# Patient Record
Sex: Female | Born: 1935 | Race: White | Hispanic: No | Smoking: Former smoker
Health system: Southern US, Community
[De-identification: ages and names within clinical notes are randomized; demographics above are authoritative.]

## PROBLEM LIST (undated history)

## (undated) DIAGNOSIS — I219 Acute myocardial infarction, unspecified: Secondary | ICD-10-CM

## (undated) DIAGNOSIS — K5790 Diverticulosis of intestine, part unspecified, without perforation or abscess without bleeding: Secondary | ICD-10-CM

## (undated) DIAGNOSIS — J449 Chronic obstructive pulmonary disease, unspecified: Secondary | ICD-10-CM

## (undated) DIAGNOSIS — K5090 Crohn's disease, unspecified, without complications: Secondary | ICD-10-CM

## (undated) DIAGNOSIS — C3490 Malignant neoplasm of unspecified part of unspecified bronchus or lung: Secondary | ICD-10-CM

## (undated) DIAGNOSIS — Z8673 Personal history of transient ischemic attack (TIA), and cerebral infarction without residual deficits: Secondary | ICD-10-CM

## (undated) DIAGNOSIS — M1990 Unspecified osteoarthritis, unspecified site: Secondary | ICD-10-CM

## (undated) DIAGNOSIS — I10 Essential (primary) hypertension: Secondary | ICD-10-CM

## (undated) DIAGNOSIS — I471 Supraventricular tachycardia, unspecified: Secondary | ICD-10-CM

## (undated) DIAGNOSIS — I779 Disorder of arteries and arterioles, unspecified: Secondary | ICD-10-CM

## (undated) DIAGNOSIS — M4800 Spinal stenosis, site unspecified: Secondary | ICD-10-CM

## (undated) DIAGNOSIS — E079 Disorder of thyroid, unspecified: Secondary | ICD-10-CM

## (undated) DIAGNOSIS — K219 Gastro-esophageal reflux disease without esophagitis: Secondary | ICD-10-CM

## (undated) DIAGNOSIS — R32 Unspecified urinary incontinence: Secondary | ICD-10-CM

## (undated) DIAGNOSIS — I2510 Atherosclerotic heart disease of native coronary artery without angina pectoris: Secondary | ICD-10-CM

## (undated) DIAGNOSIS — F419 Anxiety disorder, unspecified: Secondary | ICD-10-CM

## (undated) DIAGNOSIS — I4891 Unspecified atrial fibrillation: Secondary | ICD-10-CM

## (undated) DIAGNOSIS — Z95828 Presence of other vascular implants and grafts: Secondary | ICD-10-CM

## (undated) DIAGNOSIS — C091 Malignant neoplasm of tonsillar pillar (anterior) (posterior): Secondary | ICD-10-CM

## (undated) DIAGNOSIS — K621 Rectal polyp: Secondary | ICD-10-CM

## (undated) DIAGNOSIS — I739 Peripheral vascular disease, unspecified: Secondary | ICD-10-CM

## (undated) HISTORY — DX: Supraventricular tachycardia, unspecified: I47.10

## (undated) HISTORY — PX: CHOLECYSTECTOMY: SHX55

## (undated) HISTORY — DX: Spinal stenosis, site unspecified: M48.00

## (undated) HISTORY — PX: US ECHOCARDIOGRAPHY: HXRAD669

## (undated) HISTORY — DX: Malignant neoplasm of tonsillar pillar (anterior) (posterior): C09.1

## (undated) HISTORY — DX: Peripheral vascular disease, unspecified: I73.9

## (undated) HISTORY — DX: Atherosclerotic heart disease of native coronary artery without angina pectoris: I25.10

## (undated) HISTORY — DX: Crohn's disease, unspecified, without complications: K50.90

## (undated) HISTORY — DX: Supraventricular tachycardia: I47.1

## (undated) HISTORY — PX: ABDOMINAL HYSTERECTOMY: SHX81

## (undated) HISTORY — DX: Disorder of arteries and arterioles, unspecified: I77.9

## (undated) HISTORY — PX: CARDIOVASCULAR STRESS TEST: SHX262

## (undated) HISTORY — DX: Essential (primary) hypertension: I10

## (undated) HISTORY — DX: Unspecified atrial fibrillation: I48.91

## (undated) HISTORY — DX: Gastro-esophageal reflux disease without esophagitis: K21.9

## (undated) HISTORY — DX: Personal history of transient ischemic attack (TIA), and cerebral infarction without residual deficits: Z86.73

## (undated) HISTORY — DX: Presence of other vascular implants and grafts: Z95.828

## (undated) HISTORY — DX: Malignant neoplasm of unspecified part of unspecified bronchus or lung: C34.90

---

## 2001-02-11 ENCOUNTER — Encounter: Payer: Self-pay | Admitting: Internal Medicine

## 2001-02-11 ENCOUNTER — Ambulatory Visit (HOSPITAL_COMMUNITY): Admission: RE | Admit: 2001-02-11 | Discharge: 2001-02-11 | Payer: Self-pay | Admitting: Internal Medicine

## 2001-02-20 ENCOUNTER — Encounter: Payer: Self-pay | Admitting: Internal Medicine

## 2001-02-21 ENCOUNTER — Observation Stay (HOSPITAL_COMMUNITY): Admission: EM | Admit: 2001-02-21 | Discharge: 2001-02-21 | Payer: Self-pay | Admitting: Internal Medicine

## 2001-02-21 ENCOUNTER — Inpatient Hospital Stay (HOSPITAL_COMMUNITY): Admission: AD | Admit: 2001-02-21 | Discharge: 2001-02-24 | Payer: Self-pay | Admitting: Internal Medicine

## 2001-02-22 ENCOUNTER — Encounter: Payer: Self-pay | Admitting: Internal Medicine

## 2001-06-24 ENCOUNTER — Ambulatory Visit (HOSPITAL_COMMUNITY): Admission: RE | Admit: 2001-06-24 | Discharge: 2001-06-24 | Payer: Self-pay | Admitting: Internal Medicine

## 2001-06-24 ENCOUNTER — Encounter: Payer: Self-pay | Admitting: Internal Medicine

## 2001-08-11 ENCOUNTER — Encounter: Payer: Self-pay | Admitting: General Surgery

## 2001-08-12 ENCOUNTER — Ambulatory Visit (HOSPITAL_COMMUNITY): Admission: RE | Admit: 2001-08-12 | Discharge: 2001-08-12 | Payer: Self-pay | Admitting: General Surgery

## 2001-08-12 ENCOUNTER — Encounter: Payer: Self-pay | Admitting: General Surgery

## 2001-11-20 ENCOUNTER — Encounter: Payer: Self-pay | Admitting: *Deleted

## 2001-11-20 ENCOUNTER — Emergency Department (HOSPITAL_COMMUNITY): Admission: EM | Admit: 2001-11-20 | Discharge: 2001-11-20 | Payer: Self-pay | Admitting: *Deleted

## 2001-12-02 ENCOUNTER — Ambulatory Visit (HOSPITAL_COMMUNITY): Admission: RE | Admit: 2001-12-02 | Discharge: 2001-12-02 | Payer: Self-pay | Admitting: Internal Medicine

## 2001-12-02 ENCOUNTER — Encounter: Payer: Self-pay | Admitting: Internal Medicine

## 2001-12-11 ENCOUNTER — Encounter: Payer: Self-pay | Admitting: Internal Medicine

## 2001-12-11 ENCOUNTER — Ambulatory Visit (HOSPITAL_COMMUNITY): Admission: RE | Admit: 2001-12-11 | Discharge: 2001-12-11 | Payer: Self-pay | Admitting: Internal Medicine

## 2002-02-04 ENCOUNTER — Ambulatory Visit (HOSPITAL_COMMUNITY): Admission: RE | Admit: 2002-02-04 | Discharge: 2002-02-04 | Payer: Self-pay | Admitting: Internal Medicine

## 2002-02-17 ENCOUNTER — Encounter (INDEPENDENT_AMBULATORY_CARE_PROVIDER_SITE_OTHER): Payer: Self-pay | Admitting: Internal Medicine

## 2002-02-17 ENCOUNTER — Ambulatory Visit (HOSPITAL_COMMUNITY): Admission: RE | Admit: 2002-02-17 | Discharge: 2002-02-17 | Payer: Self-pay | Admitting: Internal Medicine

## 2002-03-05 ENCOUNTER — Encounter: Payer: Self-pay | Admitting: Internal Medicine

## 2002-03-05 ENCOUNTER — Ambulatory Visit (HOSPITAL_COMMUNITY): Admission: RE | Admit: 2002-03-05 | Discharge: 2002-03-05 | Payer: Self-pay | Admitting: Internal Medicine

## 2002-03-20 ENCOUNTER — Emergency Department (HOSPITAL_COMMUNITY): Admission: EM | Admit: 2002-03-20 | Discharge: 2002-03-20 | Payer: Self-pay | Admitting: Internal Medicine

## 2002-03-20 ENCOUNTER — Encounter: Payer: Self-pay | Admitting: Emergency Medicine

## 2002-05-12 ENCOUNTER — Inpatient Hospital Stay (HOSPITAL_COMMUNITY): Admission: AD | Admit: 2002-05-12 | Discharge: 2002-05-15 | Payer: Self-pay | Admitting: Internal Medicine

## 2002-05-12 ENCOUNTER — Encounter: Payer: Self-pay | Admitting: Internal Medicine

## 2002-05-13 ENCOUNTER — Encounter (INDEPENDENT_AMBULATORY_CARE_PROVIDER_SITE_OTHER): Payer: Self-pay | Admitting: Internal Medicine

## 2002-06-22 ENCOUNTER — Ambulatory Visit (HOSPITAL_COMMUNITY): Admission: RE | Admit: 2002-06-22 | Discharge: 2002-06-22 | Payer: Self-pay | Admitting: Internal Medicine

## 2002-06-22 ENCOUNTER — Encounter (INDEPENDENT_AMBULATORY_CARE_PROVIDER_SITE_OTHER): Payer: Self-pay | Admitting: Internal Medicine

## 2002-07-03 ENCOUNTER — Encounter (HOSPITAL_COMMUNITY): Admission: RE | Admit: 2002-07-03 | Discharge: 2002-08-02 | Payer: Self-pay | Admitting: *Deleted

## 2002-09-11 ENCOUNTER — Encounter (HOSPITAL_COMMUNITY): Admission: RE | Admit: 2002-09-11 | Discharge: 2002-10-11 | Payer: Self-pay | Admitting: Internal Medicine

## 2002-09-28 ENCOUNTER — Encounter: Payer: Self-pay | Admitting: Internal Medicine

## 2002-09-28 ENCOUNTER — Ambulatory Visit (HOSPITAL_COMMUNITY): Admission: RE | Admit: 2002-09-28 | Discharge: 2002-09-28 | Payer: Self-pay | Admitting: Internal Medicine

## 2002-12-18 ENCOUNTER — Encounter (HOSPITAL_COMMUNITY): Admission: RE | Admit: 2002-12-18 | Discharge: 2003-01-17 | Payer: Self-pay | Admitting: Internal Medicine

## 2003-01-09 ENCOUNTER — Observation Stay (HOSPITAL_COMMUNITY): Admission: EM | Admit: 2003-01-09 | Discharge: 2003-01-09 | Payer: Self-pay | Admitting: *Deleted

## 2003-01-09 ENCOUNTER — Encounter: Payer: Self-pay | Admitting: *Deleted

## 2003-06-08 ENCOUNTER — Encounter (HOSPITAL_COMMUNITY): Admission: RE | Admit: 2003-06-08 | Discharge: 2003-06-10 | Payer: Self-pay | Admitting: Internal Medicine

## 2003-06-11 ENCOUNTER — Encounter: Payer: Self-pay | Admitting: Otolaryngology

## 2003-06-11 ENCOUNTER — Ambulatory Visit (HOSPITAL_COMMUNITY): Admission: RE | Admit: 2003-06-11 | Discharge: 2003-06-11 | Payer: Self-pay | Admitting: Otolaryngology

## 2003-06-23 ENCOUNTER — Encounter: Payer: Self-pay | Admitting: Otolaryngology

## 2003-06-23 ENCOUNTER — Ambulatory Visit (HOSPITAL_COMMUNITY): Admission: RE | Admit: 2003-06-23 | Discharge: 2003-06-23 | Payer: Self-pay | Admitting: Otolaryngology

## 2003-09-01 ENCOUNTER — Ambulatory Visit (HOSPITAL_COMMUNITY): Admission: RE | Admit: 2003-09-01 | Discharge: 2003-09-01 | Payer: Self-pay | Admitting: Internal Medicine

## 2003-09-14 ENCOUNTER — Ambulatory Visit (HOSPITAL_COMMUNITY): Admission: RE | Admit: 2003-09-14 | Discharge: 2003-09-14 | Payer: Self-pay | Admitting: Internal Medicine

## 2003-10-02 HISTORY — PX: OTHER SURGICAL HISTORY: SHX169

## 2003-10-25 ENCOUNTER — Ambulatory Visit (HOSPITAL_COMMUNITY): Admission: RE | Admit: 2003-10-25 | Discharge: 2003-10-25 | Payer: Self-pay | Admitting: Otolaryngology

## 2003-10-28 ENCOUNTER — Ambulatory Visit (HOSPITAL_BASED_OUTPATIENT_CLINIC_OR_DEPARTMENT_OTHER): Admission: RE | Admit: 2003-10-28 | Discharge: 2003-10-28 | Payer: Self-pay | Admitting: Otolaryngology

## 2003-10-28 ENCOUNTER — Ambulatory Visit (HOSPITAL_COMMUNITY): Admission: RE | Admit: 2003-10-28 | Discharge: 2003-10-28 | Payer: Self-pay | Admitting: Otolaryngology

## 2003-10-28 ENCOUNTER — Encounter (INDEPENDENT_AMBULATORY_CARE_PROVIDER_SITE_OTHER): Payer: Self-pay | Admitting: *Deleted

## 2003-10-29 ENCOUNTER — Encounter: Payer: Self-pay | Admitting: *Deleted

## 2003-10-30 ENCOUNTER — Emergency Department (HOSPITAL_COMMUNITY): Admission: EM | Admit: 2003-10-30 | Discharge: 2003-10-30 | Payer: Self-pay | Admitting: Emergency Medicine

## 2003-11-22 ENCOUNTER — Ambulatory Visit (HOSPITAL_COMMUNITY): Admission: RE | Admit: 2003-11-22 | Discharge: 2003-11-22 | Payer: Self-pay | Admitting: Otolaryngology

## 2003-12-06 ENCOUNTER — Ambulatory Visit: Admission: RE | Admit: 2003-12-06 | Discharge: 2004-03-05 | Payer: Self-pay | Admitting: Radiation Oncology

## 2004-03-07 ENCOUNTER — Ambulatory Visit (HOSPITAL_COMMUNITY): Admission: RE | Admit: 2004-03-07 | Discharge: 2004-03-07 | Payer: Self-pay | Admitting: Internal Medicine

## 2004-05-03 ENCOUNTER — Ambulatory Visit (HOSPITAL_COMMUNITY): Admission: RE | Admit: 2004-05-03 | Discharge: 2004-05-03 | Payer: Self-pay | Admitting: Otolaryngology

## 2004-06-07 ENCOUNTER — Ambulatory Visit (HOSPITAL_COMMUNITY): Admission: RE | Admit: 2004-06-07 | Discharge: 2004-06-07 | Payer: Self-pay | Admitting: Internal Medicine

## 2004-07-12 ENCOUNTER — Ambulatory Visit (HOSPITAL_COMMUNITY): Admission: RE | Admit: 2004-07-12 | Discharge: 2004-07-12 | Payer: Self-pay | Admitting: Internal Medicine

## 2004-08-15 ENCOUNTER — Ambulatory Visit (HOSPITAL_COMMUNITY): Admission: RE | Admit: 2004-08-15 | Discharge: 2004-08-15 | Payer: Self-pay | Admitting: Otolaryngology

## 2004-10-01 HISTORY — PX: OTHER SURGICAL HISTORY: SHX169

## 2004-10-26 ENCOUNTER — Ambulatory Visit: Payer: Self-pay | Admitting: Internal Medicine

## 2004-10-26 ENCOUNTER — Ambulatory Visit (HOSPITAL_COMMUNITY): Admission: RE | Admit: 2004-10-26 | Discharge: 2004-10-26 | Payer: Self-pay | Admitting: Internal Medicine

## 2004-11-22 ENCOUNTER — Ambulatory Visit (HOSPITAL_COMMUNITY): Admission: RE | Admit: 2004-11-22 | Discharge: 2004-11-22 | Payer: Self-pay | Admitting: Otolaryngology

## 2005-01-26 ENCOUNTER — Ambulatory Visit (HOSPITAL_COMMUNITY): Admission: RE | Admit: 2005-01-26 | Discharge: 2005-01-27 | Payer: Self-pay | Admitting: Otolaryngology

## 2005-01-26 ENCOUNTER — Encounter (INDEPENDENT_AMBULATORY_CARE_PROVIDER_SITE_OTHER): Payer: Self-pay | Admitting: Specialist

## 2005-02-20 ENCOUNTER — Ambulatory Visit (HOSPITAL_COMMUNITY): Admission: RE | Admit: 2005-02-20 | Discharge: 2005-02-20 | Payer: Self-pay | Admitting: Internal Medicine

## 2005-04-05 ENCOUNTER — Inpatient Hospital Stay (HOSPITAL_COMMUNITY): Admission: RE | Admit: 2005-04-05 | Discharge: 2005-04-09 | Payer: Self-pay | Admitting: Thoracic Surgery

## 2005-04-05 ENCOUNTER — Encounter (INDEPENDENT_AMBULATORY_CARE_PROVIDER_SITE_OTHER): Payer: Self-pay | Admitting: *Deleted

## 2005-04-18 ENCOUNTER — Encounter: Admission: RE | Admit: 2005-04-18 | Discharge: 2005-04-18 | Payer: Self-pay | Admitting: Thoracic Surgery

## 2005-04-30 ENCOUNTER — Encounter (HOSPITAL_COMMUNITY): Admission: RE | Admit: 2005-04-30 | Discharge: 2005-05-30 | Payer: Self-pay | Admitting: Oncology

## 2005-04-30 ENCOUNTER — Ambulatory Visit (HOSPITAL_COMMUNITY): Payer: Self-pay | Admitting: Oncology

## 2005-04-30 ENCOUNTER — Encounter: Admission: RE | Admit: 2005-04-30 | Discharge: 2005-04-30 | Payer: Self-pay | Admitting: Oncology

## 2005-05-07 ENCOUNTER — Ambulatory Visit (HOSPITAL_COMMUNITY): Admission: RE | Admit: 2005-05-07 | Discharge: 2005-05-07 | Payer: Self-pay | Admitting: General Surgery

## 2005-06-06 ENCOUNTER — Encounter (HOSPITAL_COMMUNITY): Admission: RE | Admit: 2005-06-06 | Discharge: 2005-06-29 | Payer: Self-pay | Admitting: Oncology

## 2005-06-06 ENCOUNTER — Encounter: Admission: RE | Admit: 2005-06-06 | Discharge: 2005-06-29 | Payer: Self-pay | Admitting: Oncology

## 2005-06-19 ENCOUNTER — Ambulatory Visit (HOSPITAL_COMMUNITY): Payer: Self-pay | Admitting: Oncology

## 2005-07-04 ENCOUNTER — Encounter (HOSPITAL_COMMUNITY): Admission: RE | Admit: 2005-07-04 | Discharge: 2005-08-03 | Payer: Self-pay | Admitting: Oncology

## 2005-07-04 ENCOUNTER — Encounter: Admission: RE | Admit: 2005-07-04 | Discharge: 2005-07-04 | Payer: Self-pay | Admitting: Oncology

## 2005-08-08 ENCOUNTER — Ambulatory Visit (HOSPITAL_COMMUNITY): Payer: Self-pay | Admitting: Oncology

## 2005-08-08 ENCOUNTER — Encounter: Admission: RE | Admit: 2005-08-08 | Discharge: 2005-08-08 | Payer: Self-pay | Admitting: Oncology

## 2005-08-08 ENCOUNTER — Encounter (HOSPITAL_COMMUNITY): Admission: RE | Admit: 2005-08-08 | Discharge: 2005-09-07 | Payer: Self-pay | Admitting: Oncology

## 2005-09-12 ENCOUNTER — Encounter: Admission: RE | Admit: 2005-09-12 | Discharge: 2005-09-26 | Payer: Self-pay | Admitting: Oncology

## 2005-09-12 ENCOUNTER — Encounter (HOSPITAL_COMMUNITY): Admission: RE | Admit: 2005-09-12 | Discharge: 2005-09-26 | Payer: Self-pay | Admitting: Oncology

## 2005-09-26 ENCOUNTER — Ambulatory Visit (HOSPITAL_COMMUNITY): Payer: Self-pay | Admitting: Oncology

## 2005-10-24 ENCOUNTER — Encounter: Admission: RE | Admit: 2005-10-24 | Discharge: 2005-10-24 | Payer: Self-pay | Admitting: Oncology

## 2005-10-24 ENCOUNTER — Encounter (HOSPITAL_COMMUNITY): Admission: RE | Admit: 2005-10-24 | Discharge: 2005-11-23 | Payer: Self-pay | Admitting: Oncology

## 2005-11-08 ENCOUNTER — Ambulatory Visit (HOSPITAL_COMMUNITY): Admission: RE | Admit: 2005-11-08 | Discharge: 2005-11-08 | Payer: Self-pay | Admitting: Oncology

## 2005-11-20 ENCOUNTER — Ambulatory Visit (HOSPITAL_COMMUNITY): Admission: RE | Admit: 2005-11-20 | Discharge: 2005-11-20 | Payer: Self-pay | Admitting: Internal Medicine

## 2005-12-05 ENCOUNTER — Encounter (HOSPITAL_COMMUNITY): Admission: RE | Admit: 2005-12-05 | Discharge: 2006-01-04 | Payer: Self-pay | Admitting: Oncology

## 2005-12-05 ENCOUNTER — Encounter: Admission: RE | Admit: 2005-12-05 | Discharge: 2005-12-05 | Payer: Self-pay | Admitting: Oncology

## 2005-12-05 ENCOUNTER — Ambulatory Visit (HOSPITAL_COMMUNITY): Payer: Self-pay | Admitting: Oncology

## 2006-01-07 ENCOUNTER — Encounter: Admission: RE | Admit: 2006-01-07 | Discharge: 2006-01-07 | Payer: Self-pay | Admitting: Oncology

## 2006-01-07 ENCOUNTER — Encounter (HOSPITAL_COMMUNITY): Admission: RE | Admit: 2006-01-07 | Discharge: 2006-02-06 | Payer: Self-pay | Admitting: Oncology

## 2006-01-30 ENCOUNTER — Ambulatory Visit (HOSPITAL_COMMUNITY): Admission: RE | Admit: 2006-01-30 | Discharge: 2006-01-30 | Payer: Self-pay | Admitting: Internal Medicine

## 2006-02-05 ENCOUNTER — Encounter (INDEPENDENT_AMBULATORY_CARE_PROVIDER_SITE_OTHER): Payer: Self-pay | Admitting: *Deleted

## 2006-02-05 ENCOUNTER — Ambulatory Visit: Payer: Self-pay | Admitting: Internal Medicine

## 2006-02-05 ENCOUNTER — Ambulatory Visit (HOSPITAL_COMMUNITY): Admission: RE | Admit: 2006-02-05 | Discharge: 2006-02-05 | Payer: Self-pay | Admitting: Internal Medicine

## 2006-02-13 ENCOUNTER — Ambulatory Visit (HOSPITAL_COMMUNITY): Payer: Self-pay | Admitting: Oncology

## 2006-02-13 ENCOUNTER — Encounter: Admission: RE | Admit: 2006-02-13 | Discharge: 2006-02-13 | Payer: Self-pay | Admitting: Oncology

## 2006-03-27 ENCOUNTER — Encounter: Admission: RE | Admit: 2006-03-27 | Discharge: 2006-03-27 | Payer: Self-pay | Admitting: Oncology

## 2006-03-27 ENCOUNTER — Encounter (HOSPITAL_COMMUNITY): Admission: RE | Admit: 2006-03-27 | Discharge: 2006-04-26 | Payer: Self-pay | Admitting: Oncology

## 2006-04-22 ENCOUNTER — Ambulatory Visit (HOSPITAL_COMMUNITY): Payer: Self-pay | Admitting: Oncology

## 2006-05-10 ENCOUNTER — Encounter: Admission: RE | Admit: 2006-05-10 | Discharge: 2006-05-10 | Payer: Self-pay | Admitting: Oncology

## 2006-06-17 ENCOUNTER — Emergency Department (HOSPITAL_COMMUNITY): Admission: EM | Admit: 2006-06-17 | Discharge: 2006-06-17 | Payer: Self-pay | Admitting: Emergency Medicine

## 2006-06-21 ENCOUNTER — Encounter (HOSPITAL_COMMUNITY): Admission: RE | Admit: 2006-06-21 | Discharge: 2006-06-28 | Payer: Self-pay | Admitting: Oncology

## 2006-06-21 ENCOUNTER — Ambulatory Visit (HOSPITAL_COMMUNITY): Payer: Self-pay | Admitting: Oncology

## 2006-06-21 ENCOUNTER — Encounter: Admission: RE | Admit: 2006-06-21 | Discharge: 2006-06-28 | Payer: Self-pay | Admitting: Oncology

## 2006-07-12 ENCOUNTER — Encounter (INDEPENDENT_AMBULATORY_CARE_PROVIDER_SITE_OTHER): Payer: Self-pay | Admitting: *Deleted

## 2006-07-12 ENCOUNTER — Ambulatory Visit (HOSPITAL_COMMUNITY): Admission: RE | Admit: 2006-07-12 | Discharge: 2006-07-12 | Payer: Self-pay | Admitting: Otolaryngology

## 2006-07-18 ENCOUNTER — Encounter: Admission: RE | Admit: 2006-07-18 | Discharge: 2006-07-18 | Payer: Self-pay | Admitting: Oncology

## 2006-07-18 ENCOUNTER — Encounter (HOSPITAL_COMMUNITY): Admission: RE | Admit: 2006-07-18 | Discharge: 2006-08-17 | Payer: Self-pay | Admitting: Oncology

## 2006-09-13 ENCOUNTER — Ambulatory Visit (HOSPITAL_COMMUNITY): Payer: Self-pay | Admitting: Oncology

## 2006-10-01 HISTORY — PX: OTHER SURGICAL HISTORY: SHX169

## 2006-11-08 ENCOUNTER — Encounter (HOSPITAL_COMMUNITY): Admission: RE | Admit: 2006-11-08 | Discharge: 2006-12-08 | Payer: Self-pay | Admitting: Oncology

## 2006-11-25 ENCOUNTER — Ambulatory Visit (HOSPITAL_COMMUNITY): Payer: Self-pay | Admitting: Oncology

## 2006-11-26 ENCOUNTER — Ambulatory Visit (HOSPITAL_COMMUNITY): Admission: RE | Admit: 2006-11-26 | Discharge: 2006-11-26 | Payer: Self-pay | Admitting: Internal Medicine

## 2007-01-29 ENCOUNTER — Ambulatory Visit (HOSPITAL_COMMUNITY): Payer: Self-pay | Admitting: Oncology

## 2007-03-12 ENCOUNTER — Encounter (HOSPITAL_COMMUNITY): Admission: RE | Admit: 2007-03-12 | Discharge: 2007-04-11 | Payer: Self-pay | Admitting: Oncology

## 2007-03-31 ENCOUNTER — Ambulatory Visit (HOSPITAL_COMMUNITY): Payer: Self-pay | Admitting: Oncology

## 2007-04-22 ENCOUNTER — Encounter (HOSPITAL_COMMUNITY): Admission: RE | Admit: 2007-04-22 | Discharge: 2007-05-22 | Payer: Self-pay | Admitting: Oncology

## 2007-05-26 ENCOUNTER — Encounter (HOSPITAL_COMMUNITY): Admission: RE | Admit: 2007-05-26 | Discharge: 2007-06-25 | Payer: Self-pay | Admitting: Oncology

## 2007-05-27 ENCOUNTER — Ambulatory Visit (HOSPITAL_COMMUNITY): Payer: Self-pay | Admitting: Oncology

## 2007-06-11 ENCOUNTER — Ambulatory Visit: Payer: Self-pay | Admitting: Thoracic Surgery

## 2007-06-24 ENCOUNTER — Encounter: Payer: Self-pay | Admitting: Thoracic Surgery

## 2007-06-24 ENCOUNTER — Inpatient Hospital Stay (HOSPITAL_COMMUNITY): Admission: RE | Admit: 2007-06-24 | Discharge: 2007-06-28 | Payer: Self-pay | Admitting: Thoracic Surgery

## 2007-06-24 ENCOUNTER — Ambulatory Visit: Payer: Self-pay | Admitting: Thoracic Surgery

## 2007-07-08 ENCOUNTER — Encounter: Admission: RE | Admit: 2007-07-08 | Discharge: 2007-07-08 | Payer: Self-pay | Admitting: Thoracic Surgery

## 2007-07-08 ENCOUNTER — Ambulatory Visit: Payer: Self-pay | Admitting: Thoracic Surgery

## 2007-07-18 ENCOUNTER — Ambulatory Visit (HOSPITAL_COMMUNITY): Payer: Self-pay | Admitting: Oncology

## 2007-07-30 ENCOUNTER — Ambulatory Visit: Payer: Self-pay | Admitting: Thoracic Surgery

## 2007-07-30 ENCOUNTER — Encounter: Admission: RE | Admit: 2007-07-30 | Discharge: 2007-07-30 | Payer: Self-pay | Admitting: Thoracic Surgery

## 2007-09-01 ENCOUNTER — Encounter (HOSPITAL_COMMUNITY): Admission: RE | Admit: 2007-09-01 | Discharge: 2007-10-01 | Payer: Self-pay | Admitting: Oncology

## 2007-09-17 ENCOUNTER — Ambulatory Visit (HOSPITAL_COMMUNITY): Payer: Self-pay | Admitting: Oncology

## 2007-10-08 ENCOUNTER — Ambulatory Visit: Payer: Self-pay | Admitting: Thoracic Surgery

## 2007-10-13 ENCOUNTER — Encounter (HOSPITAL_COMMUNITY): Admission: RE | Admit: 2007-10-13 | Discharge: 2007-11-12 | Payer: Self-pay | Admitting: Oncology

## 2007-11-07 ENCOUNTER — Ambulatory Visit (HOSPITAL_COMMUNITY): Payer: Self-pay | Admitting: Oncology

## 2007-12-02 ENCOUNTER — Encounter (HOSPITAL_COMMUNITY): Admission: RE | Admit: 2007-12-02 | Discharge: 2008-01-01 | Payer: Self-pay | Admitting: Oncology

## 2007-12-10 ENCOUNTER — Ambulatory Visit (HOSPITAL_COMMUNITY): Admission: RE | Admit: 2007-12-10 | Discharge: 2007-12-10 | Payer: Self-pay | Admitting: Internal Medicine

## 2007-12-17 ENCOUNTER — Ambulatory Visit: Payer: Self-pay | Admitting: Thoracic Surgery

## 2007-12-22 ENCOUNTER — Ambulatory Visit (HOSPITAL_COMMUNITY): Payer: Self-pay | Admitting: Oncology

## 2008-03-15 ENCOUNTER — Ambulatory Visit (HOSPITAL_COMMUNITY): Payer: Self-pay | Admitting: Oncology

## 2008-04-27 ENCOUNTER — Encounter (HOSPITAL_COMMUNITY): Admission: RE | Admit: 2008-04-27 | Discharge: 2008-05-27 | Payer: Self-pay | Admitting: Oncology

## 2008-05-18 ENCOUNTER — Ambulatory Visit (HOSPITAL_COMMUNITY): Admission: RE | Admit: 2008-05-18 | Discharge: 2008-05-18 | Payer: Self-pay | Admitting: Oncology

## 2008-06-08 ENCOUNTER — Ambulatory Visit (HOSPITAL_COMMUNITY): Payer: Self-pay | Admitting: Oncology

## 2008-07-07 ENCOUNTER — Encounter (HOSPITAL_COMMUNITY): Admission: RE | Admit: 2008-07-07 | Discharge: 2008-08-06 | Payer: Self-pay | Admitting: Oncology

## 2008-09-13 ENCOUNTER — Ambulatory Visit (HOSPITAL_COMMUNITY): Admission: RE | Admit: 2008-09-13 | Discharge: 2008-09-13 | Payer: Self-pay | Admitting: Otolaryngology

## 2008-10-06 ENCOUNTER — Ambulatory Visit (HOSPITAL_COMMUNITY): Payer: Self-pay | Admitting: Oncology

## 2008-10-21 ENCOUNTER — Ambulatory Visit (HOSPITAL_COMMUNITY): Admission: RE | Admit: 2008-10-21 | Discharge: 2008-10-21 | Payer: Self-pay | Admitting: Internal Medicine

## 2008-11-16 ENCOUNTER — Encounter (HOSPITAL_COMMUNITY): Admission: RE | Admit: 2008-11-16 | Discharge: 2008-12-16 | Payer: Self-pay | Admitting: Oncology

## 2008-12-28 ENCOUNTER — Encounter (HOSPITAL_COMMUNITY): Admission: RE | Admit: 2008-12-28 | Discharge: 2008-12-28 | Payer: Self-pay | Admitting: Oncology

## 2008-12-28 ENCOUNTER — Ambulatory Visit (HOSPITAL_COMMUNITY): Payer: Self-pay | Admitting: Oncology

## 2009-01-04 ENCOUNTER — Ambulatory Visit (HOSPITAL_COMMUNITY): Admission: RE | Admit: 2009-01-04 | Discharge: 2009-01-04 | Payer: Self-pay | Admitting: Internal Medicine

## 2009-03-25 ENCOUNTER — Ambulatory Visit (HOSPITAL_COMMUNITY): Payer: Self-pay | Admitting: Oncology

## 2009-06-17 ENCOUNTER — Ambulatory Visit (HOSPITAL_COMMUNITY): Payer: Self-pay | Admitting: Oncology

## 2009-09-09 ENCOUNTER — Ambulatory Visit (HOSPITAL_COMMUNITY): Payer: Self-pay | Admitting: Oncology

## 2009-10-01 HISTORY — PX: OTHER SURGICAL HISTORY: SHX169

## 2009-10-26 ENCOUNTER — Ambulatory Visit (HOSPITAL_COMMUNITY): Admission: RE | Admit: 2009-10-26 | Discharge: 2009-10-26 | Payer: Self-pay | Admitting: Oncology

## 2009-11-21 ENCOUNTER — Ambulatory Visit (HOSPITAL_COMMUNITY): Admission: RE | Admit: 2009-11-21 | Discharge: 2009-11-21 | Payer: Self-pay | Admitting: Surgery

## 2009-11-24 ENCOUNTER — Ambulatory Visit (HOSPITAL_COMMUNITY): Payer: Self-pay | Admitting: Oncology

## 2009-11-25 ENCOUNTER — Encounter (HOSPITAL_COMMUNITY): Admission: RE | Admit: 2009-11-25 | Discharge: 2009-12-25 | Payer: Self-pay | Admitting: Oncology

## 2010-01-30 ENCOUNTER — Ambulatory Visit (HOSPITAL_COMMUNITY): Admission: RE | Admit: 2010-01-30 | Discharge: 2010-01-30 | Payer: Self-pay | Admitting: Oncology

## 2010-02-08 ENCOUNTER — Ambulatory Visit (HOSPITAL_COMMUNITY): Admission: RE | Admit: 2010-02-08 | Discharge: 2010-02-08 | Payer: Self-pay | Admitting: Oncology

## 2010-02-16 ENCOUNTER — Encounter (HOSPITAL_COMMUNITY): Admission: RE | Admit: 2010-02-16 | Discharge: 2010-03-18 | Payer: Self-pay | Admitting: Oncology

## 2010-02-16 ENCOUNTER — Ambulatory Visit (HOSPITAL_COMMUNITY): Payer: Self-pay | Admitting: Oncology

## 2010-05-11 ENCOUNTER — Ambulatory Visit (HOSPITAL_COMMUNITY): Payer: Self-pay | Admitting: Oncology

## 2010-05-22 ENCOUNTER — Ambulatory Visit (HOSPITAL_COMMUNITY): Admission: RE | Admit: 2010-05-22 | Discharge: 2010-05-22 | Payer: Self-pay | Admitting: Internal Medicine

## 2010-07-05 ENCOUNTER — Encounter (HOSPITAL_COMMUNITY)
Admission: RE | Admit: 2010-07-05 | Discharge: 2010-08-04 | Payer: Self-pay | Source: Home / Self Care | Admitting: Oncology

## 2010-07-05 ENCOUNTER — Ambulatory Visit (HOSPITAL_COMMUNITY): Payer: Self-pay | Admitting: Oncology

## 2010-07-06 ENCOUNTER — Ambulatory Visit (HOSPITAL_COMMUNITY): Admission: RE | Admit: 2010-07-06 | Discharge: 2010-07-06 | Payer: Self-pay | Admitting: Ophthalmology

## 2010-07-20 ENCOUNTER — Ambulatory Visit (HOSPITAL_COMMUNITY): Admission: RE | Admit: 2010-07-20 | Discharge: 2010-07-20 | Payer: Self-pay | Admitting: Ophthalmology

## 2010-09-14 ENCOUNTER — Ambulatory Visit (HOSPITAL_COMMUNITY): Payer: Self-pay | Admitting: Oncology

## 2010-10-17 ENCOUNTER — Ambulatory Visit (HOSPITAL_COMMUNITY)
Admission: RE | Admit: 2010-10-17 | Discharge: 2010-10-17 | Payer: Self-pay | Source: Home / Self Care | Attending: Internal Medicine | Admitting: Internal Medicine

## 2010-10-20 ENCOUNTER — Encounter (HOSPITAL_COMMUNITY)
Admission: RE | Admit: 2010-10-20 | Discharge: 2010-10-31 | Payer: Self-pay | Source: Home / Self Care | Attending: Oncology | Admitting: Oncology

## 2010-10-20 ENCOUNTER — Ambulatory Visit (HOSPITAL_COMMUNITY)
Admission: RE | Admit: 2010-10-20 | Discharge: 2010-10-31 | Payer: Self-pay | Source: Home / Self Care | Attending: Oncology | Admitting: Oncology

## 2010-10-21 ENCOUNTER — Encounter (HOSPITAL_COMMUNITY): Payer: Self-pay | Admitting: Oncology

## 2010-10-22 ENCOUNTER — Encounter (HOSPITAL_COMMUNITY): Payer: Self-pay | Admitting: Oncology

## 2010-10-22 ENCOUNTER — Encounter: Payer: Self-pay | Admitting: Thoracic Surgery

## 2010-10-26 ENCOUNTER — Ambulatory Visit (HOSPITAL_COMMUNITY)
Admission: RE | Admit: 2010-10-26 | Discharge: 2010-10-26 | Payer: Self-pay | Source: Home / Self Care | Attending: Pulmonary Disease | Admitting: Pulmonary Disease

## 2010-10-31 ENCOUNTER — Ambulatory Visit: Admit: 2010-10-31 | Payer: Self-pay | Admitting: Internal Medicine

## 2010-10-31 ENCOUNTER — Other Ambulatory Visit (HOSPITAL_COMMUNITY): Payer: Self-pay | Admitting: Internal Medicine

## 2010-10-31 DIAGNOSIS — S22009A Unspecified fracture of unspecified thoracic vertebra, initial encounter for closed fracture: Secondary | ICD-10-CM

## 2010-11-02 ENCOUNTER — Ambulatory Visit (HOSPITAL_COMMUNITY)
Admission: RE | Admit: 2010-11-02 | Discharge: 2010-11-02 | Disposition: A | Discharge status: Home / Self Care | Payer: Medicare Other | Source: Ambulatory Visit | Attending: Internal Medicine | Admitting: Internal Medicine

## 2010-11-02 DIAGNOSIS — M546 Pain in thoracic spine: Secondary | ICD-10-CM | POA: Insufficient documentation

## 2010-11-02 DIAGNOSIS — X58XXXA Exposure to other specified factors, initial encounter: Secondary | ICD-10-CM | POA: Insufficient documentation

## 2010-11-02 DIAGNOSIS — S22009A Unspecified fracture of unspecified thoracic vertebra, initial encounter for closed fracture: Secondary | ICD-10-CM | POA: Insufficient documentation

## 2010-11-06 ENCOUNTER — Other Ambulatory Visit (HOSPITAL_COMMUNITY): Payer: Self-pay | Admitting: Orthopedic Surgery

## 2010-11-08 ENCOUNTER — Ambulatory Visit (HOSPITAL_COMMUNITY)
Admission: RE | Admit: 2010-11-08 | Discharge: 2010-11-08 | Disposition: A | Discharge status: Home / Self Care | Payer: Medicare Other | Source: Ambulatory Visit | Attending: Orthopedic Surgery | Admitting: Orthopedic Surgery

## 2010-11-08 ENCOUNTER — Encounter (HOSPITAL_COMMUNITY): Payer: Self-pay

## 2010-11-08 DIAGNOSIS — M949 Disorder of cartilage, unspecified: Secondary | ICD-10-CM | POA: Insufficient documentation

## 2010-11-08 DIAGNOSIS — M899 Disorder of bone, unspecified: Secondary | ICD-10-CM | POA: Insufficient documentation

## 2010-12-01 ENCOUNTER — Encounter (HOSPITAL_COMMUNITY): Payer: Medicare Other | Attending: Oncology

## 2010-12-01 ENCOUNTER — Other Ambulatory Visit (HOSPITAL_COMMUNITY): Payer: Medicare Other

## 2010-12-01 DIAGNOSIS — Z452 Encounter for adjustment and management of vascular access device: Secondary | ICD-10-CM

## 2010-12-01 DIAGNOSIS — C760 Malignant neoplasm of head, face and neck: Secondary | ICD-10-CM

## 2010-12-13 LAB — HEPATIC FUNCTION PANEL
Albumin: 3.5 g/dL (ref 3.5–5.2)
Alkaline Phosphatase: 73 U/L (ref 39–117)
Total Bilirubin: 0.4 mg/dL (ref 0.3–1.2)

## 2010-12-14 LAB — BASIC METABOLIC PANEL
CO2: 26 mEq/L (ref 19–32)
Calcium: 8.6 mg/dL (ref 8.4–10.5)
Creatinine, Ser: 0.69 mg/dL (ref 0.4–1.2)
GFR calc Af Amer: >60 mL/min (ref >60)
Glucose, Bld: 103 mg/dL — ABNORMAL HIGH (ref 70–99)

## 2010-12-14 LAB — HEMOGLOBIN AND HEMATOCRIT, BLOOD: HCT: 41.9 % (ref 36.0–46.0)

## 2010-12-18 LAB — CBC
HCT: 43.9 % (ref 36.0–46.0)
MCHC: 32.5 g/dL (ref 30.0–36.0)
MCV: 92.4 fL (ref 78.0–100.0)
Platelets: 271 10*3/uL (ref 150–400)
RDW: 15 % (ref 11.5–15.5)

## 2010-12-18 LAB — DIFFERENTIAL
Band Neutrophils: 1 % (ref 0–10)
Basophils Absolute: 0 10*3/uL (ref 0.0–0.1)
Basophils Relative: 0 % (ref 0–1)
Blasts: 0 %
Lymphocytes Relative: 21 % (ref 12–46)
Lymphs Abs: 1.1 10*3/uL (ref 0.7–4.0)
Monocytes Absolute: 0.3 10*3/uL (ref 0.1–1.0)
Monocytes Relative: 5 % (ref 3–12)
Neutro Abs: 3.9 10*3/uL (ref 1.7–7.7)

## 2010-12-18 LAB — COMPREHENSIVE METABOLIC PANEL
Albumin: 3.7 g/dL (ref 3.5–5.2)
BUN: 8 mg/dL (ref 6–23)
Creatinine, Ser: 0.73 mg/dL (ref 0.4–1.2)
Total Protein: 7 g/dL (ref 6.0–8.3)

## 2010-12-20 LAB — DIFFERENTIAL
Basophils Absolute: 0.1 10*3/uL (ref 0.0–0.1)
Eosinophils Relative: 4 % (ref 0–5)
Lymphocytes Relative: 20 % (ref 12–46)
Lymphs Abs: 1.3 10*3/uL (ref 0.7–4.0)
Monocytes Absolute: 0.5 10*3/uL (ref 0.1–1.0)
Monocytes Relative: 8 % (ref 3–12)
Neutro Abs: 4.5 10*3/uL (ref 1.7–7.7)

## 2010-12-20 LAB — CBC
MCHC: 34.2 g/dL (ref 30.0–36.0)
MCV: 92.4 fL (ref 78.0–100.0)
Platelets: 262 10*3/uL (ref 150–400)
Platelets: 280 10*3/uL (ref 150–400)
RBC: 4.6 MIL/uL (ref 3.87–5.11)
RBC: 4.65 MIL/uL (ref 3.87–5.11)
WBC: 6.6 10*3/uL (ref 4.0–10.5)

## 2010-12-20 LAB — COMPREHENSIVE METABOLIC PANEL
ALT: 14 U/L (ref 0–35)
AST: 17 U/L (ref 0–37)
Albumin: 3.6 g/dL (ref 3.5–5.2)
Alkaline Phosphatase: 69 U/L (ref 39–117)
BUN: 11 mg/dL (ref 6–23)
CO2: 29 mEq/L (ref 19–32)
Calcium: 9.2 mg/dL (ref 8.4–10.5)
Chloride: 99 mEq/L (ref 96–112)
Creatinine, Ser: 0.75 mg/dL (ref 0.4–1.2)
GFR calc Af Amer: >60 mL/min (ref >60)
GFR calc non Af Amer: >60 mL/min (ref >60)
Glucose, Bld: 121 mg/dL — ABNORMAL HIGH (ref 70–99)
Potassium: 3.9 mEq/L (ref 3.5–5.1)
Sodium: 135 mEq/L (ref 135–145)
Total Bilirubin: 0.5 mg/dL (ref 0.3–1.2)
Total Protein: 7.1 g/dL (ref 6.0–8.3)

## 2010-12-20 LAB — BASIC METABOLIC PANEL
BUN: 10 mg/dL (ref 6–23)
CO2: 28 mEq/L (ref 19–32)
Calcium: 8.9 mg/dL (ref 8.4–10.5)
Creatinine, Ser: 0.68 mg/dL (ref 0.4–1.2)
GFR calc Af Amer: >60 mL/min (ref >60)

## 2010-12-20 LAB — CEA: CEA: 1.7 ng/mL (ref 0.0–5.0)

## 2010-12-25 ENCOUNTER — Ambulatory Visit (HOSPITAL_COMMUNITY)
Admission: RE | Admit: 2010-12-25 | Discharge: 2010-12-25 | Disposition: A | Discharge status: Home / Self Care | Payer: Medicare Other | Source: Ambulatory Visit | Attending: Internal Medicine | Admitting: Internal Medicine

## 2010-12-25 ENCOUNTER — Other Ambulatory Visit (HOSPITAL_COMMUNITY): Payer: Self-pay | Admitting: Internal Medicine

## 2010-12-25 DIAGNOSIS — J189 Pneumonia, unspecified organism: Secondary | ICD-10-CM

## 2010-12-25 DIAGNOSIS — R05 Cough: Secondary | ICD-10-CM

## 2011-01-02 ENCOUNTER — Encounter (HOSPITAL_COMMUNITY): Payer: Medicare Other | Attending: Oncology | Admitting: Oncology

## 2011-01-11 ENCOUNTER — Encounter (HOSPITAL_COMMUNITY): Payer: Medicare Other

## 2011-01-16 ENCOUNTER — Encounter (HOSPITAL_COMMUNITY): Payer: Medicare Other

## 2011-01-16 DIAGNOSIS — C760 Malignant neoplasm of head, face and neck: Secondary | ICD-10-CM

## 2011-01-16 DIAGNOSIS — Z452 Encounter for adjustment and management of vascular access device: Secondary | ICD-10-CM

## 2011-01-16 LAB — COMPREHENSIVE METABOLIC PANEL
Alkaline Phosphatase: 67 U/L (ref 39–117)
BUN: 12 mg/dL (ref 6–23)
Creatinine, Ser: 0.72 mg/dL (ref 0.4–1.2)
Glucose, Bld: 99 mg/dL (ref 70–99)
Potassium: 4 mEq/L (ref 3.5–5.1)
Total Bilirubin: 0.5 mg/dL (ref 0.3–1.2)
Total Protein: 6.4 g/dL (ref 6.0–8.3)

## 2011-01-16 LAB — DIFFERENTIAL
Basophils Absolute: 0.1 10*3/uL (ref 0.0–0.1)
Basophils Relative: 1 % (ref 0–1)
Lymphocytes Relative: 21 % (ref 12–46)
Monocytes Relative: 9 % (ref 3–12)
Neutro Abs: 3.8 10*3/uL (ref 1.7–7.7)
Neutrophils Relative %: 67 % (ref 43–77)

## 2011-01-16 LAB — CBC
HCT: 42.5 % (ref 36.0–46.0)
Hemoglobin: 14.6 g/dL (ref 12.0–15.0)
MCV: 93.9 fL (ref 78.0–100.0)
Platelets: 284 10*3/uL (ref 150–400)
RDW: 14.5 % (ref 11.5–15.5)

## 2011-02-13 NOTE — Letter (Signed)
July 08, 2007   Ladona Horns. Neijstrom, MD  [address]  [place], [place] [address]   Re:  [name], [name]                DOB:  1927-03-21   Dear Minerva Areola,   I saw [name] back today after left VATS resection of two recurrent  metastatic cancer lesions.  She is doing well overall.  I removed her  sutures.  I told her to gradually increase her activity.  Her chest x-  ray was improving.  Her blood pressure was 141/83, pulse 76,  respirations 18, oxygen saturation was 95%.  I will see her back again  in four weeks with a chest x-ray.   Ines Bloomer, M.D.  Electronically Signed   DPB/MEDQ  D:  07/08/2007  T:  07/09/2007  Job:  [id number]

## 2011-02-13 NOTE — Discharge Summary (Signed)
NAME:  [name], [name]                ACCOUNT NO.:  [id number]   MEDICAL RECORD NO.:  [id number]          PATIENT TYPE:  INP   LOCATION:  3301                         FACILITY:  [facility]   PHYSICIAN:  Ines Bloomer, M.D. DATE OF BIRTH:  01/10/1927   DATE OF ADMISSION:  06/24/2007  DATE OF DISCHARGE:                               DISCHARGE SUMMARY   FINAL DIAGNOSIS:  Left lower lobe nodules, positive metastatic basaloid  squamous cell carcinoma.   SECONDARY DIAGNOSES:  1. Gastroesophageal reflux disease.  2. Hypertension.  3. History of poorly differentiated basaloid squamous cell carcinoma      status post left video-assisted thoracoscopic surgery with wedge      resection of two lesions in the left upper lobe.  The patient was      followed by Dr. Glenford Peers and underwent chemotherapy.  4. Anxiety.   IN-HOSPITAL OPERATIONS AND PROCEDURES:  Left video-assisted  thoracoscopic surgery with left thoracotomy, wedge resection of left  lower lobe x2 with lymph node dissection.   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  The patient went left video-  assisted thoracoscopic surgery with wedge resection of two lesions in  the left upper lobe showing to be poorly differentiated basaloid  squamous cell carcinoma.  This was done April 05, 2005.  Following  surgery, the patient was followed by Dr. Glenford Peers and underwent  chemotherapy.  There was noted to be a lesion in the right lower lobe  which has since resolved.  However, in a follow-up, she suddenly  developed two lesions in the left lower lobe.  She underwent PET scan  shown to be positive in these lesions. These lesions were vein in the  basilar segments of the left lower lobe.  Pulmonary function tests  showed an FEC of 2.15 and FEV-1 of 1.15.  The patient had no hemoptysis,  fevers, chills  or excessive sputum. I felt that this could be recurrent  basilar carcinoma and surgery was recommended.  The patient was seen and  evaluated by Dr. Edwyna Shell.  Dr. Edwyna Shell discussed with the patient  undergoing left VATS with thoracotomy and wedge resection of these  lesions.  He discussed the risks and benefits.  The patient acknowledged  her understanding and agreed to proceed.  The surgery was scheduled for  June 24, 2007.  For details of the patient's past medical history  and physical exam please see dictated H&P.   The patient was taken to the operating room June 24, 2007 where she  underwent a left video-assisted thoracoscopic surgery with left  thoracotomy, wedge resection of the left lower lobe x2 with lymph node  dissection.  The patient tolerated this procedure well and was  transferred to the intensive care unit in stable condition.  Postoperatively the patient was noted to be hemodynamically stable.  She  was extubated immediately following surgery.  Post extubation the  patient noted to be alert and oriented x4.  Neuro intact.  The patient's  postoperative course was pretty much unremarkable.  Postop day #1, the  patient's vital signs were noted to be stable.  The chest x-ray was  clear.  No air leak was detected and suction was decreased.  The patient  was out of bed and ambulating with assistance postop day #1.  She was  transferred to 3300 postop day #2.  Daily chest x-rays showed her to be  stable and posterior chest tube discontinued postop day #2.  The patient  continued to increase ambulation.  By postop day #3, no air leak noted  and chest x-ray stable.  Remaining chest tube discontinued. Follow-up  chest x-ray showed no pneumothorax.  During the patient's postoperative  course, her vital signs were monitored and remained stable.  She  remained afebrile.  She was able to be weaned off oxygen sating greater  than 90% on room air.  The patient remained in normal  sinus rhythm.  Her  pulmonary status continued to improve.  Incisions were clean, dry,  intact and healing well.  The patient was out of bed ambulating well  with assistance.  She was tolerating diet well.  No nausea or vomiting  noted prior to discharge home.   Labs postop day #2 showed a white count 6.2, hemoglobin of 12.0,  hematocrit 35.6, platelet count 216.  Sodium of 136, potassium 3.9,  chloride 102, bicarbonate 29, BUN of 6, creatinine 0.63, glucose 121.   The patient is tentatively ready for discharge home in the next 24-48  hours pending she remains stable and a.m. chest x-ray is stable.   FOLLOW-UP APPOINTMENTS:  A follow-up appointment has been arranged with  Dr. Edwyna Shell for July 08, 2007 at 3:45 p.m. The patient will need to  obtain PMI chest x-ray 30 minutes prior to this appointment.   ACTIVITY:  The patient instructed no driving until released to do so, no  lifting over 10 pounds.  She is told to ambulate 3-4 times per day,  progress as tolerated and to continue her breathing exercises.   INCISIONAL CARE:  The patient is told to shower washing her incisions  using soap and water.  She is to contact the office if she develops any  drainage or opening from any of her incision sites.   DIET:  The patient educated on diet to be low-fat, low-salt.   DISCHARGE MEDICATIONS:  1. Atenolol 25 mg daily.  2. Nexium 40 mg daily.  3. Xanax 0.25 mg half tablet at bedtime.  4. Pentasa 250 mg 4 capsules 4 times a day.  5. Tylenol 650 mg 1-2 tablets q. 4-6 hours p.r.n.  6. Percocet 5/325 one to two tablets q. 4-6 hours p.r.n.      Theda Belfast, [place]      Ines Bloomer, M.D.  Electronically Signed    KMD/MEDQ  D:  06/27/2007  T:  06/27/2007  Job:  [id number]   cc:   Ladona Horns. Mariel Sleet, MD

## 2011-02-13 NOTE — Op Note (Signed)
NAME:  [name], [name]                ACCOUNT NO.:  [id number]   MEDICAL RECORD NO.:  [id number]          PATIENT TYPE:  INP   LOCATION:  2872                         FACILITY:  [facility]   PHYSICIAN:  Ines Bloomer, M.D. DATE OF BIRTH:  01-12-1927   DATE OF PROCEDURE:  06/24/2007  DATE OF DISCHARGE:                               OPERATIVE REPORT   PREOPERATIVE DIAGNOSIS:  Left lower lobe nodules, questionable  metastatic basaloid squamous cancer.   POSTOPERATIVE DIAGNOSIS:  Left lower lobe nodules, questionable  metastatic basaloid squamous cancer.   OPERATION:  Left video assisted thoracoscopic surgery, left thoracotomy,  wedge left lower lobe lesion x2.   SURGEON:  Ines Bloomer, M.D.   FIRST ASSISTANT:  Stephanie Acre Dominick, P.A.-C.   ANESTHESIA:  General anesthesia.   After percutaneous insertion of all monitoring lines, the patient  underwent general anesthesia. The patient had previous two left upper  lobe lesions removed approximately two years previously which turned out  to be basaloid squamous cancer.  She underwent chemotherapy and now had  two left lower lobe lesions.  She is brought to the operating room,  underwent general anesthesia, was turned to the left lateral thoracotomy  position, was prepped and draped in the usual sterile manner.  A dual  lumen tube was inserted. A trocar site was made in the posterior  axillary line at the seventh intercostal space and the 30 degree scope  was inserted which showed that there were a lot of adhesions in the left  upper lobe but the lower lobe appeared to be fairly clear.  We  previously used an inferior thoracotomy, so we decided to use a  posterior thoracotomy over the sixth intercostal space.  A 6 cm incision  was made.  Dissection was carried down partially dividing the latissimus  and reflecting the serratus anteriorly and then sixth intercostal space  putting a small Tuffier in.  We then freed up the inferior  pulmonary  ligament, palpated the two lesions in the left lower lobe.  The first  one was in the superior segment.  This was wedged out with two  applications of the EZ-45 stapler.  The other one is in the basilar  segment and that was wedged out, again, with the EZ-45 stapler with  three applications.  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  Two chest tubes were placed, one through the trocar site which  was a right angle chest tube.  A Marcaine block was done in the usual  fashion.  FloSeal was applied to the staple lines.  A single On-Q was  placed in the usual fashion.  Another chest tube was brought through an  old trocar site and sutured in place with 0 silk.  The chest was closed  with three pericostals drilling through the seventh rib and passed  around the sixth rib, #1 Vicryl in the muscle layer, 2-0 Vicryl in the  subcutaneous tissue, and Dermabond for the skin.  The patient was  returned to the recovery room in stable condition.      Ines Bloomer,  M.D.  Electronically Signed     DPB/MEDQ  D:  06/24/2007  T:  06/24/2007  Job:  [id number]

## 2011-02-13 NOTE — Letter (Signed)
July 30, 2007   Ladona Horns. Neijstrom, MD  [address]  [place] [address]   Re:  [name], [name]                DOB:  09/22/1927   Dear Minerva Areola:   I saw the patient back in today.  Her chest x-ray looks great and her  incision is doing well.  I understand that you will repeat her CT scan  in December.  I had a long discussion with her and recommended that she  just follow along with this with the CT scan.  I will see her back again  in two months with either a chest x-ray or if it is after the CT scan,  we will see her with the CT scan.   Ines Bloomer, M.D.  Electronically Signed   DPB/MEDQ  D:  07/30/2007  T:  07/30/2007  Job:  [id number]

## 2011-02-13 NOTE — Op Note (Signed)
NAME:  [name], [name]                ACCOUNT NO.:  [id number]   MEDICAL RECORD NO.:  [id number]          PATIENT TYPE:  AMB   LOCATION:  DAY                           FACILITY:  [facility]   PHYSICIAN:  Lionel December, M.D.    DATE OF BIRTH:  July 26, 1927   DATE OF PROCEDURE:  10/21/2008  DATE OF DISCHARGE:                               OPERATIVE REPORT   PROCEDURE:  Esophagogastroduodenoscopy with esophageal dilation.   INDICATIONS:  [name] is a 77 year old Caucasian female who presented with  dysphagia to solids.  She said she also has difficulty with liquids at  times.  She has chronic GERD and maintained on PPI.  She has had a  Schatzki's ring dilated a few occasion in the past.  Most recent exam  was in May 2007.  She has history of head and neck CA and had received  radiation.  She also had focused radiation therapy to lung lesions at  [facility] last December, and she has felt to be in remission.  She says her  heartburn  is well controlled with therapy.   Procedure risks were reviewed with the patient and informed consent was  obtained.   CONSCIOUS SEDATION:  Cetacaine spray for pharyngeal topical anesthesia.  Fentanyl 50 mcg IV and versed 4 mg IV.   FINDINGS:  Procedure performed in endoscopy suite.  The patient's vital  signs and O2 sats were monitored during the procedure and remained  stable.  The patient was placed in left lateral recumbent position and  Pentax videoscope was placed via the oropharynx without any difficulty  into esophagus.  Laryngeal papilla  was examined and there was some  asymmetry to the left wall but no mass or ulceration was noted.   Esophagus:  Mucosa of the esophagus, normal.  Noncritical ring was noted  at GE junction was at 40 cm.  There was 2 erosions at GE junction and  erythema and edema at the mucosa on the gastric side  of Z line.  Hiatus  was at 42 cm.   Stomach:  It was empty and distended very well on insufflation.  Proximal stomach normal.   Examination of the mucosa, body, antrum,  pyloric channel, as well as angularis, fundus , and cardia were normal.   Duodenum:  Bulbar mucosa was normal.  The scope was passed second part  of duodenum where mucosa and folds were normal.   Endoscope was withdrawn.   Esophagus:  Dilated by passing a 56-French Maloney dilator in full  insertion.  As the dilators were withdrawn, the endoscope was passed  again and and the area was still intact.  The area was disrupted with a  focal biopsy at the 3 sites all on the right side.  Esophageal mucosa in  the cervical region was carefully examined on the way out and there was  no mucosal disruption  noted.  The patient tolerated the procedure well.   FINAL DIAGNOSES:  1. Noncritical Schatzki ring, which is dilated with 56-French Maloney      dilator and finally disrupted  focal biopsy.  2. Erosive reflux  esophagitis despite antireflux medicines.  The      patient is taking Nexium.  3. Small sliding hiatal hernia.   RECOMMENDATIONS:  1. Antireflux measures were reinforced.  She will continue Nexium as      before.  2. Omeprazole 20 mg daily before eating meals for 8 weeks.   If she possibly could, I would like her to cut back on the dose of  dicyclomine to may be b.i.d.   The patient will call us with progress report.      Lionel December, M.D.  Electronically Signed     NR/MEDQ  D:  10/21/2008  T:  10/22/2008  Job:  [id number]

## 2011-02-13 NOTE — Letter (Signed)
October 08, 2007   Ladona Horns. Neijstrom, MD  [address]  [place], [place] [address]   Re:  [name], Amye K                DOB:  [birth year]   Dear Minerva Areola:   I saw Ms. [name] back today.  Her blood pressure was 141/78.  Pulse 70.  Respirations 18.  SATs were 92%.  She complained of being lethargic but  overall is doing well.  Her chest x-ray is stable and her CT scan showed  no evidence of recurrence.  I again pushed the opportunity of her having  a second opinion done at [facility], [place], or [place], and I hope  that she will do this.  She will have another CT scan done in April and  I will see her back again at that time.  I am very satisfied with her  progress.   Ines Bloomer, M.D.  Electronically Signed   DPB/MEDQ  D:  10/08/2007  T:  10/08/2007  Job:  [id number]

## 2011-02-13 NOTE — Letter (Signed)
June 11, 2007   Dr. Glenford Peers, MD.  [address] [place], [place]  [address]   Re:  [name], Kikuye K                DOB:  02-Sep-1927   Dear Minerva Areola:   I saw Ms. [name] back today.  It is quite remarkable how these two  nodules in the left lower lobe have increased suddenly in size.  I am  worried that there may be a very aggressive type tumor.  I got a PET  scan on her, and these two nodules are positive.  As per our discussion,  I have gone ahead and recommended that she get a left lower lobectomy,  trisegmentectomy.  The risks and benefits of the procedure were  explained to her.  Her pulmonary function test showed an FVC of 2.15  with an FEV1 of 1.154, 82% of predicted.  Even though this is the same  side we operated on before, I think we can do this with minimal  morbidity.  Plan to do this on October 1 at [facility].   I appreciate the opportunity of seeing Ms. [name].   Sincerely,   Ines Bloomer, M.D.  Electronically Signed   DPB/MEDQ  D:  06/11/2007  T:  06/12/2007  Job:  [id number]

## 2011-02-13 NOTE — Letter (Signed)
December 17, 2007   Kingsley Callander. Ouida Sills, MD  [address]  [place], [place] [address]   Re:  [name], [name]                DOB:  1926-12-25   Dear Channing Mutters,   I saw Ms. [name] back in the office today and reviewed the CT scans that  you got on 12/10/07.  As you are aware, there are now two nodules in the  left lower lobe where we operated, one that was previously 3 mm and now  7 cm and a new questionable nodule of 10 mm in the left lower lobe.  So  all of this goes along that she has had an earlier recurrence after  eight months after resection of her last nodules.  Because of this, I do  not think anymore surgery would be beneficial in her case.  I think she  should be treated with chemotherapy.   She still is not sure, she still keeps talking about getting a second  opinion, and I would agree if that is what she wants, I will send her to  [facility], [place], or [place].  I mentioned this to Dr.  Mariel Sleet with my last letter.   I will see her again in three months with a chest x-ray.   Her blood pressure was 140/76, pulse 64, respirations 18, sats 93%.   Ines Bloomer, M.D.  Electronically Signed   DPB/MEDQ  D:  12/17/2007  T:  12/17/2007  Job:  [id number]   cc:   Ladona Horns. Mariel Sleet, MD

## 2011-02-13 NOTE — H&P (Signed)
NAME:  [name], [name]                ACCOUNT NO.:  [id number]   MEDICAL RECORD NO.:  [id number]          PATIENT TYPE:  INP   LOCATION:  NA                           FACILITY:  [facility]   PHYSICIAN:  Ines Bloomer, M.D. DATE OF BIRTH:  [birth year]   DATE OF ADMISSION:  DATE OF DISCHARGE:                              HISTORY & PHYSICAL   CHIEF COMPLAINT:  Left lower lobe nodules.   HISTORY OF PRESENT ILLNESS:  This 77 year old Caucasian female was found  to have a questionable right T12 schwannoma  and bilateral pulmonary  nodules and underwent a VATS wedge resection of 2 lesions in the left  lupper lobe, one of which in the upper lobe was a poorly differentiated  basaloid squamous cell cancer and the other one was also the same. One  was 0.4 cm in size and 0.7 cm in size. This was on April 05, 2005. She was  followed by Dr. Glenford Peers and underwent chemotherapy. There was a  lesion in the right lower lobe which has since resolved. However in our  followup, she suddenly developed 2 lesions in the left lower lobe which  a PET scan was done and was positive in the lesions. These lesions were  in the basilar segments of the left lower lobe. Pulmonary function tests  showed an FEC of 2.15 and FEV1 of 1.15. She has had no hemoptysis,  fever, chills, or excessive sputum. It was thought by Dr. Mariel Sleet that  this could be recurrent basaloid  carcinoma and she is referred for  possible resection.  The risks of the procedure were explained to her  and she agrees to go ahead. The T11-12 schwannoma really has not changed  and is actually less prominent on her followup CT scan.   FAMILY HISTORY:  Negative for cardiac and cancer.   PAST MEDICAL HISTORY:  She is allergic to DEMEROL and MORPHINE. She is  also allergic to CODEINE and PENICILLIN.   CURRENT MEDICATIONS:  1. Atenolol 25 mg a day.  2. Nexium 40 mg a day.  3. Xanax 0.25, 1/2-tablet p.r.n.  4. __________  250 mg 4 times a day.  5.  Tylenol.   SOCIAL HISTORY:  She is widowed, quit smoking about 7 years ago, does  not drink alcohol on a regular basis.   REVIEW OF SYSTEMS:  Her weight is 166 pounds, she is 5 foot 5. CARDIAC:  She gets no angina or atrial fibrillation. PULMONARY:  She has had a  cough, no hemoptysis, gets shortness of breath with exertion. GI:  She  has had no nausea, vomiting, constipation or diarrhea. GU:  She has had  frequent urination. VASCULAR:  She had a question of mini strokes in the  past but no claudication or DVT. MUSCULOSKELETAL:  She has chronic joint  pains and arthritis. NEUROLOGIC:  No headaches, blackouts, seizures.  INT:  She has had a recent decrease in her eyesight and hearing.  PSYCHIATRIC:  No change in her eyesight, no psychiatric illnesses.  HEMATOLOGIC:  No problems with clotting disorder or anemia.   PHYSICAL EXAMINATION:  She is a well-developed, Caucasian female in no  acute distress.  VITAL SIGNS:  Her blood pressure is 140/79. Pulse 78, respirations 15,  saturations were 94%.  HEENT:  Head is atraumatic. Eyes, pupils equal round and reactive to  light and accommodation. Extraocular movements are normal. Ears,  tympanic membranes are intact. Nose, there is no septal deviation.  Throat is without lesions. There is some thickening of her right  tonsillar pillar.  NECK:  Supple without thyromegaly. She also has a well healed thyroid  scar.  CHEST:  Clear to auscultation and percussion.  HEART:  Regular sinus rhythm, no murmurs.  ABDOMEN:  Soft, there is no hepatosplenomegaly.  EXTREMITIES:  Pulses 2+, there is no clubbing or edema.  SKIN:  No other lesions that we could see.  NEUROLOGIC:  She is oriented x3. Cranial nerves intact. Sensory and  motor intact.   IMPRESSION:  1. 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  2. Hypercholesterolemia.  3. Gastroesophageal reflux disease.   4. Hypertension.  5. Status post resection of left upper lobe lesions, new left lower      lobe lesions that are positive on PET.   PLAN:  Left VATS, resection of left lower lobe lesions.      Ines Bloomer, M.D.  Electronically Signed     DPB/MEDQ  D:  06/22/2007  T:  06/22/2007  Job:  [id number]

## 2011-02-16 NOTE — Op Note (Signed)
NAME:  [name], [name]                ACCOUNT NO.:  [id number]   MEDICAL RECORD NO.:  [id number]          PATIENT TYPE:  AMB   LOCATION:  DAY                           FACILITY:  [facility]   PHYSICIAN:  Lionel December, M.D.    DATE OF BIRTH:  June 17, 1927   DATE OF PROCEDURE:  02/05/2006  DATE OF DISCHARGE:                                 OPERATIVE REPORT   PROCEDURE:  Esophagogastroduodenoscopy with esophageal dilation.   INDICATIONS:  Floris is a 77 year old Caucasian female with chronic GERD who  has solid food dysphagia.  She also complains of intermittent epigastric  pain at times and severe but transient associated nausea.  She has history  of Schatzki's ring which was last dilated in January 2006.  She was recently  seen in the office and we felt her dysphagia was probably multifactorial  predominately due to xerostomia related to prior radiation therapy for head  and neck CA.  She had a barium pill study done revealing small sliding  hiatal hernia with a smooth stricture at GE junction where pill got hung up  temporarily (barium pill).  She also had mild tertiary contractions.  She is  therefore undergoing EGD to dilate the GE junction hoping to ameliorate her  dysphagia.  Will also examine her stomach and duodenum to make sure she does  not have peptic ulcer disease.  She just had abdominopelvic CT by Dr.  Mariel Sleet 4 weeks ago and it was negative for adenopathy or pancreatic  lesions or dilated ducts.  She has history of Crohn disease and clinically  appears to be in remission.  She has some thickening to her TI on the study.   Procedure risks were reviewed the patient, informed consent was obtained.   MEDS FOR CONSCIOUS SEDATION:  Benzocaine spray pharyngeal topical  anesthesia, Demerol, benzocaine spray for pharyngeal topical anesthesia,  fentanyl 50 mcg IV, Versed 5 mg IV.   FINDINGS:  Procedure performed in endoscopy suite.  The patient's vital  signs and O2 sat were monitored  during procedure and remained stable.  The  patient was placed in left lateral position.  Olympus videoscope was passed  by oropharynx without any difficulty into esophagus.  Examination of the  pharynx revealed no abnormality.  The mucosa was somewhat dry.   Esophagus.  Mucosa was normal throughout.  GE junction was at 41 and hiatus  was at 43.  There was noncritical ring at GE junction. There was no  ulceration or erosions noted.   Stomach.  It was empty and distended very with insufflation.  Folds proximal  stomach were normal.  Examination mucosa at body, antrum, pyloric channel as  well as angularis, fundus and cardia was normal.   Duodenum.  Bulbar mucosa was normal except finding of two small polyps which  were ablated via cold biopsy and submitted in one container.  These are  possibly nonadenomatous.  Scope was passed into second part of duodenum  where mucosa and folds were normal.   Endoscope was pulled back to the stomach.  Balloon dilator was advanced  through the scope.  Guide wire was pushed out in the gastric lumen.  Endoscope was pulled back in the body of esophagus and balloon catheter was  positioned across distal esophagus and initially inflated to diameter of 18  mm then 20 mm.  Subsequently inflated balloon was gradually passed through  the esophagus without any difficulty.  There was a tiny tear at the GE  junction mucosal disruption at GE junction.  Balloon was deflated withdrawn.  The endoscope was withdrawn.  The patient tolerated the procedure well.   FINAL DIAGNOSIS:  Noncritical Schatzki's ring which was dilated, slight  disrupted with the 20 mm balloon.  Small sliding hiatal hernia.  Two small  bulbar polyps which were ablated via cold biopsy.   RECOMMENDATIONS:  No evidence of peptic ulcer disease.   Therefore I am not sure why she has the spells of epigastric pain with  nausea.   RECOMMENDATIONS:  She will continue entire reflux measures and a PPI  as  before.  Should she experience another episode of epigastric pain, we will  check her LFTs within 12-24 hours to make sure she does not have biliary  colic.   I will be contacting the patient with results of biopsy.      Lionel December, M.D.  Electronically Signed     NR/MEDQ  D:  02/05/2006  T:  02/06/2006  Job:  [id number]   cc:   Kingsley Callander. Ouida Sills, MD  Fax: [phone]   Ladona Horns. Mariel Sleet, MD  Fax: [phone]

## 2011-02-16 NOTE — Op Note (Signed)
NAME:  [name], [name]                          ACCOUNT NO.:  [id number]   MEDICAL RECORD NO.:  [id number]                   PATIENT TYPE:  AMB   LOCATION:  DSC                                  FACILITY:  [facility]   PHYSICIAN:  Suzanna Obey, M.D.                    DATE OF BIRTH:  November 13, 1926   DATE OF PROCEDURE:  10/28/2003  DATE OF DISCHARGE:                                 OPERATIVE REPORT   PREOPERATIVE DIAGNOSIS:  Right tonsil mass and right tonsillitis.   POSTOPERATIVE DIAGNOSIS:  Right tonsil mass and right tonsillitis.   OPERATION PERFORMED:  Right tonsillectomy.   SURGEON:  Suzanna Obey, M.D.   ANESTHESIA:  General endotracheal tube.   ESTIMATED BLOOD LOSS:  Less than 5 mL.   INDICATIONS FOR PROCEDURE:  This 77 year old has had repetitive problems  with the right tonsil and has been on multiple broad spectrum antibiotics.  She continues to have changes in the right tonsil and a small smooth-  appearing and feeling mass just above the tonsil.  She was informed of the  risks and benefits of the procedure including bleeding, infection,  velopharyngeal insufficiency, change in the voice, chronic pain, and risks  of the anesthetic.  All questions were answered and consent was obtained.   DESCRIPTION OF PROCEDURE:  The patient was taken to the operating room and  placed in supine position.  After adequate general endotracheal tube  anesthesia, she was placed in the supine position and the McIvor gag was  placed and the right tonsil was examined.  The right tonsil was very firm  and there was some purulent material within the crypts.  There was a smooth  feeling and appearing mass just above the tonsil.  An incision was made in  the anterior tonsillar pillar and that mass in the superior aspect was  really part of the tonsil, so it was dissected.  The tonsil was dissected  free with electrocautery and it did come out of the capsule very nicely and  easily.  The tonsil was then removed  and sent for permanent section.  The  suction cautery was used to obtain hemostasis.  The area was irrigated.  Hypopharynx, esophagus and stomach were suctioned with the NG tube.  The  Crowe-Davis mouth was released and resuspended.  There was hemostasis  present in all locations.  The patient was awakened and brought to recovery  in stable condition.  Counts correct.                                               Suzanna Obey, M.D.   Cordelia Pen  D:  10/28/2003  T:  10/28/2003  Job:  [id number]   cc:   Kingsley Callander. Ouida Sills, M.D.  [address]  [address]  [place]  [place] [address]  Fax: [phone]

## 2011-02-16 NOTE — Consult Note (Signed)
NAME:  [name], [name]                          ACCOUNT NO.:  [id number]   MEDICAL RECORD NO.:  [id number]                   PATIENT TYPE:  INP   LOCATION:  A304                                 FACILITY:  [facility]   PHYSICIAN:  Tana Coast, PA                    DATE OF BIRTH:  1926/10/22   DATE OF CONSULTATION:  DATE OF DISCHARGE:                                   CONSULTATION   REASON FOR CONSULTATION:  Right lower quadrant abdominal pain, Crohn's  disease.   HISTORY OF PRESENT ILLNESS:  The patient is a pleasant 77 year old Caucasian  female with history of spinal stenosis, history of kidney stones, small  bowel Crohn's disease who was admitted yesterday with suspected renal  colic/renal stone.  Noncontrast CT revealed no urinary calculi or  hydronephrosis.  Liver was upper limits of normal size, small hiatal hernia.  Urinalysis initially revealed a moderate amount of blood in Dr. Alonza Smoker  office.  On repeat again there was evidence of microhematuria.  Cytology is  planned.  On admission white count was 8.6, hemoglobin 15, hematocrit 43.3,  platelets 307,000.  Sedimentation rate today is 14.   The patient developed worsening of her typical right lower quadrant  abdominal pain on Sunday.  This progressively worsened and she had  diaphoresis as well.  She therefore presented to Dr. Alonza Smoker office the day  of admission.  She denies any associated nausea, vomiting.  She denied any  diarrhea at that time.  Actually had a normal bowel movement that day.  Since hospitalization she has developed loose stools having at least three  yesterday.  Notably, she has not received any contrast for her CT scan.  She  tells me that she constantly has abdominal pain related to her Crohn's  disease, usually having pain daily in the right lower quadrant region.  She  has these episodes of increased intensity, however.  She has been  complaining of alternating constipation and diarrhea at least for the  last  several months.  Dr. Karilyn Cota attempted a colonoscopy (as she had never had a  complete examination).  Examination, unfortunately, was incomplete secondary  to tight turns in the sigmoid colon.  However, of the colon seen there was  only diverticula and actual hemorrhoids.  This was followed by an air  contrast barium enema which revealed scattered diverticula.  The contrast  did not reflux into the terminal ileum.  There was no appendix seen.  Since  hospitalization she complains of heartburn symptoms.  She has been having no  dysuria, but complains of nocturia, usually has to get up five times during  the night to urinate.  She is on Pentasa, possibly 1 g b.i.d.   MEDICATIONS:  1. Pentasa ?1 g b.i.d.  2. Plavix 75 mg q.d.  3. Zocor 20 mg q.d.  4. Atenolol 25 mg q.d.  5. Lorazepam p.r.n.  6. Tylenol p.r.n.   ALLERGIES:  PENICILLIN, DEMEROL, CODEINE, MORPHINE.   PAST MEDICAL HISTORY:  A 12 year history of small bowel Crohn's disease.  Most recent CT of the abdomen and pelvis was September 1999 which showed  thickened distal/ileum small bowel.  Hypertension, hypercholesterolemia,  TIA, history of kidney stones, spinal stenosis, non-obstructive coronary  artery disease seen on cardiac catheterization.  She had well preserved LV  function.  Meningioma.  She has had right leg vein stripping, hysterectomy,  breast surgery one time for a tumor.  Did not require any further treatment,  two times for a cyst.  Cholecystectomy, flexible sigmoidoscopy as outlined  above.   FAMILY HISTORY:  No family history of colorectal cancer.  She tells me that  she has remote relatives that have had Crohn's disease.   SOCIAL HISTORY:  She is widowed, lives alone.  She has two children.  No  tobacco or alcohol use.   REVIEW OF SYMPTOMS:  Please see HPI for GI and for GU.   PHYSICAL EXAMINATION:  VITAL SIGNS:  Height 5 feet 6 inches, weight 202.8,  Tmax 97, Tcurrent 96.9, pulse 74, respirations 20,  blood pressure 150/90.  GENERAL:  Pleasant, moderately obese Caucasian female in no acute distress.  SKIN:  Warm and dry.  No jaundice.  HEENT:  Conjunctivae pink.  Sclerae nonicteric.  Oral mucosa moist and pink.  No lesions, erythema, or exudate.  No lymphadenopathy.  CHEST:  Lungs clear to auscultation.  CARDIAC:  Regular rate and rhythm.  Normal S1, S2.  No murmurs, rubs, or  gallops.  ABDOMEN:  Positive bowel sounds, obese, but symmetrical, soft.  Diffuse mild  tenderness to deep palpation.  Moderate tenderness in the right lower  quadrant region to deep palpation.  No organomegaly or masses.  EXTREMITIES:  No edema.   LABORATORIES:  As mentioned in HPI.  In addition, sodium 137, potassium 3.3  initially, on repeat 4.6, BUN 7, creatinine 0.8, glucose 103.   IMPRESSION:  The patient is a pleasant 77 year old Caucasian female with  increased intensity of her right lower quadrant pain in the setting of small  bowel Crohn's disease and history of kidney stones.  She also complains of  alternating constipation and diarrhea.  A noncontrast CT did not reveal any  urinary tract stones or hydronephrosis.  She does have microscopic  hematuria, etiology unclear, currently in work-up.  Would wonder about  possibility of ileal stricture given her intermittent pain in alternating  bowels.  Unfortunately, her terminal ileum has not been well visualized on  recent attempted colonoscopy as well as air contrast barium enema.  I  suspect her symptoms are more from chronic Crohn's disease/changes versus an  acute flair (given normal sedimentation rate).   SUGGESTIONS:  1. Most likely will proceed with small bowel follow through.  2. Continue supportive measures.  3. Continue Pentasa.  Will need to verify dose.   I would like to thank Dr. Ouida Sills for allowing [place] to take part in the care of  this patient.                                              Tana Coast, PA    LL/MEDQ  D:  05/13/2002   T:  05/13/2002  Job:  [id number]

## 2011-02-16 NOTE — H&P (Signed)
NAME:  [name], [name]                            ACCOUNT NO.:  [id number]   MEDICAL RECORD NO.:  [id number]                  PATIENT TYPE:   LOCATION:                                       FACILITY:   PHYSICIAN:  Kingsley Callander. Ouida Sills, M.D.                  DATE OF BIRTH:   DATE OF ADMISSION:  DATE OF DISCHARGE:                                HISTORY & PHYSICAL   CHIEF COMPLAINT:  Abdominal pain and back pain.   HISTORY OF THE PRESENT ILLNESS:  This patient is a 77 year old white female  who presented to the office complaining of right back and abdominal pain.  She was found to have 2 to 3+ blood in the urine.  She has a history of  kidney stones.  She had recently been in the emergency room with similar  symptoms and was felt to have passed a stone.  She has had heaving and  diaphoresis.  She denies gross hematuria or fever.  She also has a history  of spinal stenosis causing back pain.  She has a history of Crohn's disease  causing abdominal pain.  She has not had rectal bleeding recently.  She did  have an episode of diarrhea last week.   PAST MEDICAL HISTORY:  1. Spinal stenosis.  2. Kidney stones.  3. Hypertension.  4. Hypercholesterolemia.  5. TIA.  6. Crohn's disease.  7. Meningioma.  8. Hysterectomy.  9. Right leg vein stripping.   MEDICATIONS:  1. Pentasa -- unknown dose.  2. Plavix 75 mg q.d.  3. Zocor 20 mg q.d.  4. Atenolol 25 mg q.d.  5. Lorazepam p.r.n.   ALLERGIES:  PENICILLIN, DEMEROL, CODEINE and MORPHINE.   SOCIAL HISTORY:  She does not smoke or abuse alcohol.  She lives alone.   FAMILY HISTORY:  Her father had coronary artery disease and died at 32.   REVIEW OF SYSTEMS:  No difficulty breathing, increased cough, fever, chills.  Otherwise, negative.   PHYSICAL EXAMINATION:  VITAL SIGNS:  Temperature 97.1, pulse 75,  respirations 20, blood pressure 158/88.  GENERAL:  Alert, moderately uncomfortable white female.  HEENT:  No scleral icterus.  Oropharynx  reveals dentures.  NECK:  No JVD, thyromegaly or cervical lymphadenopathy.  LUNGS:  Clear.  HEART:  Regular with no murmurs.  ABDOMEN:  Tender in the lower aspect of the right upper quadrant and right  flank region.  No palpable masses or hepatosplenomegaly.  EXTREMITIES:  No cyanosis, clubbing or edema.  NEUROLOGIC:  Intact.   LABORATORY DATA:  White count 8.6, hemoglobin 15, platelets 307,000.  Sodium  137, potassium 3.3, BUN 7, creatinine 0.8, calcium 8.5.  Urinalysis revealed  2 to 3+ blood in the office but on a urinalysis at the hospital, she has a  small amount of blood and 3 to 6 red cells; nitrite and leukocyte esterase  are negative.   IMPRESSION:  1. Right renal colic.  She has symptoms suggestive of a stone near the right     ureteropelvic junction.  She has had associated diaphoresis and heaving.     She is being hospitalized for pain control and further evaluation with a     CT scan of the abdomen and pelvis.  2. Crohn's disease, stable on Pentasa.  3. Spinal stenosis.  4. Hypercholesterolemia.  5. History of hypertension and transient ischemic attack.                                               Kingsley Callander. Ouida Sills, M.D.    ROF/MEDQ  D:  05/12/2002  T:  05/12/2002  Job:  [id number]

## 2011-02-16 NOTE — Op Note (Signed)
NAME:  [name], [name]                          ACCOUNT NO.:  [id number]   MEDICAL RECORD NO.:  [id number]                   PATIENT TYPE:  AMB   LOCATION:  DAY                                  FACILITY:  [facility]   PHYSICIAN:  Lionel December, M.D.                 DATE OF BIRTH:  1927/06/12   DATE OF PROCEDURE:  06/07/2004  DATE OF DISCHARGE:                                 OPERATIVE REPORT   PROCEDURE:  Esophagogastroduodenoscopy with esophageal dilation.   INDICATIONS FOR PROCEDURE:  [name] is a 77 year old Caucasian female who  presents with dysphagia primarily to solids.  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  She is in remission.  She  states that her dysphagia started after her first couple of doses of RT.  At  one point she could not even swallow liquids.  She also complains of a dry  mouth.  She has chronic GERD.  She is on PPI, and she has intermittent  heartburn maybe a couple of times a week.  She is undergoing diagnostic and  therapeutic EGD.  It appears that her radiation __________ may have involved  the upper esophagus or EUS area, and she could have a stricture as a result.  The procedure risks were reviewed with the patient, and informed consent for  the procedure was obtained.   PREOPERATIVE MEDICATIONS:  Cetacaine spray for pharyngeal topical  anesthesia, Fentanyl 50 mcg IV, Versed 5 mg IV in divided dose.   FINDINGS:  The procedure was performed in the endoscopy suite.  The  patient's vital signs and O2 saturations were monitored during the procedure  and remained stable.  The patient was placed in the left lateral recumbent  position, and the Olympus videoscope was passed via the oropharynx without  any difficulty into the esophagus.  The hypopharynx and laryngeal inlet was  examined on the way out.  There appeared to be very slight edema to the  epiglottic tip, but there was no ulceration.  Pictures were taken for the  record.   Esophagus:  The mucosa of the esophagus was normal throughout.  There was a  ring at the GE junction.  There a 3 to 4-cm size sliding hiatal hernia.  The  gastric mucosa below the Z line was edematous and erythematous, but there  were no erosions or ulcers noted.   Stomach:  It was empty and distended very well with insufflation.  The folds  of the proximal stomach were normal.  Examination of  the mucosa revealed  antral erythema and granularity, but no erosions or ulcers were noted.  The  pyloric channel was patent.  The angularis, fundus, and cardia were examined  by retroflexing the scope and were normal.   Duodenum:  Examination of the bulb revealed normal mucosa.  The scope was  passed to the second part of the duodenum  where the mucosa and folds were  normal.  The endoscope was withdrawn.   The esophagus was dilated by passing a 54 and 56 [place] Maloney dilators  through the esophagus without any difficulty.  As the dilator was withdrawn,  the endoscope was passed again, and there was no esophageal mucosal  disruption.  The ring was subsequently disrupted with a three-quadrant  biopsy.  The endoscope was withdrawn.  The patient tolerated the procedure  well.   FINAL DIAGNOSES:  1.  No evidence of esophageal stricture.  2.  Schatski's ring which does not appear to be critical.  3.  The esophagus was dilated by passing a 54 and 56 [place] Maloney dilators      without mucosal disruption.  This ring was subsequently disrupted with a      three-quadrant biopsy.  4.  Small sliding hiatal hernia with mild change of reflux esophagitis      limited to the gastroesophageal junction.  5.  Nonerosive antral gastritis.  6.  Very mild edema noted to the epiglottic tip without ulceration or other      abnormalities.   RECOMMENDATIONS:  1.  She will continue antireflux measures and Nexium as before.  2.  H pylori serology will be checked today.  3.  If she remains with  dysphagia, will consider a barium pill esophagogram.      ___________________________________________                                            Lionel December, M.D.   NR/MEDQ  D:  06/07/2004  T:  06/07/2004  Job:  [id number]   cc:   Kingsley Callander. Ouida Sills, M.D.  [address]  [place]  [place] [address]  Fax: [phone]   Wynn Banker, M.D.  [address] [facility]  [place]  [place] [address]  Fax: [phone]   Suzanna Obey, M.D.  [address] [place]  [place] [address]  Fax: [phone]

## 2011-02-16 NOTE — Group Therapy Note (Signed)
NAME:  [name], [name]                ACCOUNT NO.:  [id number]   MEDICAL RECORD NO.:  [id number]          PATIENT TYPE:   LOCATION:                                 FACILITY:   PHYSICIAN:  Boris M. Darovsky, M.D.     DATE OF BIRTH:   DATE OF PROCEDURE:  04/22/2006  DATE OF DISCHARGE:                                   PROGRESS NOTE   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  It  is my understanding that she underwent radiation therapy with last treatment  on Feb 01, 2004.  In 2006, she was found to have bilateral lung nodules, and  April 06, 2005, she underwent video-assisted thoracoscopic surgery and wedge  resection x2 by Dr. Jovita Gamma.  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  The resections had  negative margins.  After that, the patient received 6 cycles of chemotherapy  with carboplatin, Taxol, and Erbitux which she tolerated quite well, and she  has had an excellent response by CT scan criteria.  Her previous CT scan  from January 07, 2006, did not reveal any evidence of malignant growth in both  lungs.  The patient is here after restaging CT scans which were performed 3  days ago on April 19, 2006.  Otherwise she reports no active problems today.   REVIEW OF SYSTEMS:  She denies any TIA-like neurological symptoms.  No  fever, no chills, no weight loss.  Her appetite is good.  She does have some  mild dryness and some soreness in her throat over the last couple of years  after radiation which appears to be stable and not significantly bothering  her.  She denies any new skin pigmented lesions, no neck masses, no chest  pain.  She does have some semi productive cough with clear white mucus once  in a while. No shortness of breath.  She does report having poorly localized  muscle ache type pains all over according to the  patient.  She denies any  abdominal discomfort, no nausea, no vomiting, no diarrhea, but she does have  intermittent constipation controlled with laxatives p.r.n.  Denies any  hematuria, no excessive bleeding or bruising.   PHYSICAL EXAMINATION:  VITAL SIGNS: Weight 179.  Blood pressure 100/70,  temperature 97, heart rate 76, respirations 16.  GENERAL:  Well-developed, middle-aged, pleasant lady in no acute distress.  Alert and oriented x3.  Appropriate, pleasant.  SKIN: Rash appearance appreciated.  HEENT: PERRLA.  Mucous membranes moist.  No oral lesions appreciated.  NECK:  Supple.  No palpable masses.  No cervical lymphadenopathy  appreciated.  LUNGS: Clear to auscultation bilaterally.  HEART: Regular rhythm and rate.  No S3, S4.  CHEST: She has a Port-A-Cath placed in the right upper chest with no signs  of infection or inflammation.  ABDOMEN: Obese, benign. No obvious organomegaly. No obvious masses present.  EXTREMITIES:  No edema, clubbing, or cyanosis.  Distal pulses intact.  NEUROLOGIC:  No focal  findings.  MUSCULOSKELETAL:  Palpation of her chest wall, spine, back, upper  extremities, and lower extremities revealed moderate tenderness of the  muscles, fibromyalgia type, at least 6 points.   LABORATORY DATA:  Blood work from today: None.   I had opportunity to personally review CT of the neck, chest, abdomen, and  pelvis and compared these studies to the prior imaging series from January 07, 2006.  [name] also was present and tried to also look on these films,  and I was able to provide explanation of the findings.  In brief, this did  not demonstrate any new abnormalities including her neck, chest, or abdomen.  So I do not have any discrepancies in reading these official radiology  reports signed by Dr. Ulyses Southward.   IMPRESSION:  1.  Metastatic poorly differentiated squamous cell carcinoma of the      tonsillar pillar diagnosed in 2005 status post radiation therapy  in      2005.  Later metastatic spread to lungs bilaterally status post wedge      resection, currently stable staging CT scan imaging studies.  2.  Crohn's disease.  3.  History of Erbitux-induced rash which has resolved.  4.  History of transient ischemic attack in the past.  5.  Hypertension.  6.  Hypercholesterolemia.  7.  Pneumonia on several occasions.  8.  Benign breast biopsy x3.  9.  Status post thyroidectomy for benign disease.  10. Status post ovarian resection in 1999.   RECOMMENDATIONS:  1.  I have spent a considerable amount of time reviewing films with Mrs.      [name] and discussing these findings and reassured her that at this      point, we do not have any evidence of recurrent disease.  I do not feel      at this point she would require investigation.  The patient has done      remarkably well, and she will be scheduled to see Dr. Mariel Sleet in 3      months followup.  At that time, I suspect imaging studies will be      repeated as well as cell      counts and chemistry pathology has to be checked again.  2.  The patient confirms understanding of staging results and plan of      further followup.  She had opportunity to ask questions and was      satisfied with my answers.  Time spent with the patient: 45 minutes.           ______________________________  Lebron Conners. Ubaldo Glassing, M.D.     BMD/MEDQ  D:  04/22/2006  T:  04/22/2006  Job:  [id number]   cc:   Ines Bloomer, M.D.  [address]  [place]  [place] [address]   Kingsley Callander. Ouida Sills, MD  Fax: [phone]   Maryln Gottron, M.D.  Fax: [phone]   Lionel December, M.D.  [address]  [place]  [place] [phone]

## 2011-02-16 NOTE — Op Note (Signed)
[facility]  Patient:    [name], [name] Visit Number: [id number] MRN: [id number]          Service Type: END Location: DAY Attending Physician:  Malissa Hippo Dictated by:   Lionel December, M.D. Proc. Date: 02/04/02 Admit Date:  02/04/2002   CC:         Carylon Perches, M.D.   Operative Report  PROCEDURE:  Attempted colonoscopy.  INDICATIONS:  [name] is a 77 year old Caucasian female with 12-year history of small bowel Crohns disease who also has intermittent constipation and hematochezia.  She is undergoing colonoscopy, both for diagnostic and screening purposes.  The procedure was reviewed with the patient, and informed consent was obtained.  PREOPERATIVE MEDICATIONS:  Fentanyl 50 mcg IV and Versed 5 mg IV in divided dose.  INSTRUMENT:  Olympus video system.  FINDINGS:  Procedure was performed in endoscopy suite. The patients vital signs and O2 sat were monitored during the procedure and remained stable.  The patient was placed in the left lateral recumbent position.  Rectal examination was performed.  No abnormality noted on external digital exam.  A scope was placed in the rectum and advanced to the sigmoid colon.  A very tortuous sigmoid colon was ______ fixed.  A few small diverticula were noted.  She had a fairly tight turn in the mid to proximal sigmoid colon.  I could see the lumen tree as the scope was pushed forward.  She was complaining of pain. Abdominal pressure and change in position does not make any difference.  The scope was pulled out, and the pediatric scope was used.  I could pass the scope a little farther than with a standard scope, but she was still complaining of pain, and therefore, I opted not to proceed with the procedure. The mucosa of the sigmoid colon was normal other than for a few diverticula. The rectal mucosa was also normal.  The scope was retroflexed and examined anorectal junction, and hemorrhoids were noted  below the dentate line.  The endoscope was straightened and withdrawn.  The patient was very comfortable and stable at the end of the procedure, and abdomen was soft.  FINAL DIAGNOSES: 1. Incomplete exam, limited to flexible sigmoidoscopy.  A few diverticula  noted in the sigmoid colon. 2. External hemorrhoids.  RECOMMENDATIONS: She will continue her Pentasa and other medications as before. She can resume her Plavix.  Will bring her back for air contrast barium enema within the next few weeks. Dictated by:   Lionel December, M.D. Attending Physician:  Malissa Hippo DD:  02/04/02 TD:  02/05/02 Job: [id number]

## 2011-02-16 NOTE — Discharge Summary (Signed)
   NAME:  [name], Khushbu                            ACCOUNT NO.:  [id number]   MEDICAL RECORD NO.:  [id number]                  PATIENT TYPE:   LOCATION:                                       FACILITY:   PHYSICIAN:  Kingsley Callander. Ouida Sills, M.D.                  DATE OF BIRTH:   DATE OF ADMISSION:  01/09/2003  DATE OF DISCHARGE:  01/09/2003                                 DISCHARGE SUMMARY   DISCHARGE DIAGNOSES:  1. Transient ischemic attack.  2. Crohn's disease.  3. Hyperlipidemia.  4. Hypertension.  5. Meningiomas.   PROCEDURE:  Head CT.   HOSPITAL COURSE:  This patient is a 77 year old white female who presented  with weakness in her left arm.  When I discussed her symptoms with her  later, she complained of weakness in her legs.  She was hospitalized by the  hospitalist for observation for a TIA.  Her head CT revealed multiple  hypodense calcified masses consistent with meningiomas which were unchanged  since 2001.  Her blood pressure was 137/67.  Her symptoms resolved.  Her  neurologic status was at baseline on 04/10.  Aspirin was added to Plavix.   She was hypokalemic at 3.4, potassium was supplemented orally.  She was in  normal sinus rhythm by EKG.   In light of her resolved symptoms she was felt to be stable for discharge on  04/10.  She will have follow up in my office in 1 week.  A daily potassium  supplement will be added.  She has had a lot of diarrhea recently with her  Crohn's disease and is likely loosing her potassium through this means.   DISCHARGE MEDICATIONS:  1. Potassium 20 mEq daily.  2. Aspirin 81 mg daily.  3. Plavix 75 mg daily.  4. Zetia 10 mg daily.  5. Atenolol 25 mg daily.  6. Pentasa 1 gm q.i.d.                                               Kingsley Callander. Ouida Sills, M.D.    ROF/MEDQ  D:  01/09/2003  T:  01/09/2003  Job:  [id number]

## 2011-02-16 NOTE — Consult Note (Signed)
NAME:  [name], [name]                          ACCOUNT NO.:  [id number]   MEDICAL RECORD NO.:  [id number]                   PATIENT TYPE:  EMS   LOCATION:  MAJO                                 FACILITY:  [facility]   PHYSICIAN:  Karol T. Lazarus Salines, M.D.              DATE OF BIRTH:  Feb 10, 1927   DATE OF CONSULTATION:  10/30/2003  DATE OF DISCHARGE:                                   CONSULTATION   CHIEF COMPLAINT:  Bleeding from the throat.   HISTORY OF PRESENT ILLNESS:  This 77 year old white female underwent  tonsillectomy two days ago for recurrent infection and an abnormal-appearing  tonsil.  She was kept overnight observation due to geographic distance and  then went home yesterday morning.  Beginning last night, she had episodic  reportedly severe bleeding.  She went to the [facility] Room where  intermittent bleeding was identified.  She was transferred to [facility] for  further attention.  She had been taking Plavix several weeks back but  stopped before her surgery.  No aspirin or other blood thinners.  No known  bleeding disorders.   In the [facility], she was relatively hypotensive but  asymptomatic, never truly hypotensive.  No hypertension currently.  She has  a past history of hypertension and cardiac disease.  She claims that, after  bleeding some, she threw up which made it bleed worse, but the bleeding came  first.   PHYSICAL EXAMINATION:  GENERAL:  This is a somewhat heavyset, older white  female in no distress.  She has some bloody soiling around the face, but she  is not actively bleeding.  Mental status seems appropriate.  HEENT:  Ears are clear.  Anterior nose has minimal blood soiling.  Oral  cavity has blood soiling with upper and lower plates.  There was a large  clot in the right tonsil fossa.  Did not examine nasal pharynx or  hypopharynx.  NECK:  Unremarkable.   IMPRESSION:  Post tonsillectomy hemorrhage, probably arteriolar given her  description of intermittent profuse bleeding.   PLAN:  With informed consent, we anesthetized her throat, beginning with 1%  plain Xylocaine gargle followed by a total of 18 ml of 1% Xylocaine with  1:100,000 epinephrine in stages around the tonsil fossa.  After allowing  adequate time for this to take effect, the clot was extricated from the  tonsil fossa.  Surprisingly, no bleeding was encountered.  Several areas  that oozed slightly with vigorous manipulation with the cautery tip, were  identified and were suction coagulated in the superior pole, mid pole, and  inferior pole.  No bleeding was encountered during the manipulation.  The  patient tolerated the procedure reasonably well with intermittent suction  and periodic rest to allow her to get her breath.   I explained to her that, without active bleeding visualized, I am not  positive that we identified the correct  site; however, I cauterized several  suspicious sites.  We will observe her for 2 hours in the emergency room to  make sure this does not recur.  Otherwise, I think she is safe to go home to  continue her recovery.                                               Gloris Manchester. Lazarus Salines, M.D.    KTW/MEDQ  D:  10/30/2003  T:  10/30/2003  Job:  [id number]   cc:   Kingsley Callander. Ouida Sills, M.D.  [address]  [place]  [place] [address]  Fax: [phone]   Suzanna Obey, M.D.  [address] [place]  [place] [address]  Fax: [phone]

## 2011-02-16 NOTE — Op Note (Signed)
NAME:  [name], [name]                ACCOUNT NO.:  [id number]   MEDICAL RECORD NO.:  [id number]          PATIENT TYPE:  AMB   LOCATION:  DAY                           FACILITY:  [facility]   PHYSICIAN:  Barbaraann Barthel, M.D. DATE OF BIRTH:  1927/01/13   DATE OF PROCEDURE:  05/07/2005  DATE OF DISCHARGE:                                 OPERATIVE REPORT   SURGEON:  Dr. Malvin Johns.   PREOPERATIVE DIAGNOSIS:  Metastatic carcinoma of the tonsil.   POSTOPERATIVE DIAGNOSIS:  Metastatic carcinoma of the tonsil.   PROCEDURE:  Placement of right subclavian Port-A-Cath under fluoroscopy.   SPECIMEN:  None.   NOTE:  This is a 77 year old white female who had an interesting past  medical history of carcinoma of the tonsil. She underwent treatment of this  with excision and radiation therapy and then later was noted to have  metastatic disease to the lung and enlarged thyroid. She underwent a  thyroidectomy, left lower lobe of the thyroid, and there were no metastatic  disease within that. However, she had metastatic carcinoma of the lung, and  she has other metastases within the right lung as well. Surgery was asked to  see her for placement of a Port-A-Cath.   We discussed this procedure in detail, discussing complications not limited  to but including bleeding, infection, pneumothorax and catheter  embolization. Informed consent was obtained.   GROSS OPERATIVE FINDINGS:  The patient had placement via the right  subclavian vein without problems. This was done under direct fluoroscopy  with good positioning of this in the distal portion of the superior vena  cava. Final portable chest x-ray is pending.   TECHNIQUE:  The patient was placed in the Trendelenburg position after  prepping the right hemithorax with Betadine solution and draping in the  usual manner. An area under her right clavicle was anesthetized with 1%  Xylocaine without epinephrine. We then punctured the right subclavian vein  and threaded a guidewire through this, and later, using a venous dilator, we  then placed over the guidewire the Silastic catheter. This was trimmed to  appropriate distance. We checked this under fluoroscopy to find that this  was in good position, and then after irrigating the infusion device and  connecting this, we then aspirated and then irrigated the catheter with  heparinized solution. The catheter was then placed in a subcutaneous pocket,  and the subcutaneous layer was closed with 4-0  Polysorb with a subcuticular closure of 5-0 Polysorb for the skin. Steri-  Strips, Neosporin, 2x2, and OpSite dressing was applied. Prior to closure,  all sponge, needle and instrument counts were found to be correct. Estimated  blood loss was minimal. No drains were placed. There were no complications.      Barbaraann Barthel, M.D.  Electronically Signed     WB/MEDQ  D:  05/07/2005  T:  05/07/2005  Job:  [id number]   cc:   Kingsley Callander. Ouida Sills, MD  [address]  [place]  [place] [address]  Fax: [phone]   Ladona Horns. Neijstrom, MD  [address]  [place]  [place] [address]  Fax: [phone]

## 2011-02-16 NOTE — Procedures (Signed)
   NAME:  [name], [name]                          ACCOUNT NO.:  [id number]   MEDICAL RECORD NO.:  [id number]                   PATIENT TYPE:  OBV   LOCATION:  A303                                 FACILITY:  [facility]   PHYSICIAN:  Edward L. Juanetta Gosling, M.D.             DATE OF BIRTH:  04/16/1927   DATE OF PROCEDURE:  01/08/2003  DATE OF DISCHARGE:  01/09/2003                                EKG INTERPRETATION   DATE AND TIME OF TEST:  January 08, 2003 at 2355.   FINDINGS:  The rhythm is sinus rhythm with a rate in the 70s.  There is a  suggestion of left atrial enlargement.  There are nonspecific T wave  abnormalities.   IMPRESSION:  Abnormal electrocardiogram.                                               Edward L. Juanetta Gosling, M.D.    ELH/MEDQ  D:  01/11/2003  T:  01/11/2003  Job:  [id number]

## 2011-02-16 NOTE — Discharge Summary (Signed)
[place]  Patient:    [name]                       MRN: [id number] Adm. Date:  [id number] Disc. Date: 02/24/01 Attending:  Lewayne Bunting Dictator:   Delton See, P.A. CC:         Kingsley Callander. Ouida Sills, III, [address], [place], [place]  [address]   Referring Physician Discharge [place]  DATE OF BIRTH:  1927-09-01  HISTORY OF PRESENT ILLNESS:  [name] is a 77 year old female who was admitted to [facility], Feb 21, 2001, for evaluation of chest pain. [name] had previously undergone a catheterization on June 23, 1996 that revealed normal coronary arteries with an ejection fraction of 55%.  [name] has a history of Crohns disease.  [name] has had multiple surgeries.  [name] has a history of a meningioma.  [name] was recently diagnosed with lumbar spine degenerative joint disease and spinal stenosis.  [name] has a history of chronic microscopic hematuria.  [name] has had multiple breast biopsies.  [name] is status post right leg vein stripping.  [name] has a remote tobacco history, a history of supraventricular tachycardia, details unknown, and a history of a false-positive Cardiolite prior to her catheterization in 1997.  As noted, the patient was recently diagnosed with spinal stenosis.  [name] was started on a prednisone taper by Dr. Tiburcio Pea in [name].  The night before her admission to [facility], [name] was walking down the hall to go to bed when [name] developed a viselike chest pain.  [name] states [name] could not move due to the severity of the pain.  It radiated to both arms, her shoulders and her jaw.  [name] felt as if [name] were going to die.  [name] became nauseated.  The patient states [name] has had several of these episodes; one occurred while shopping at a store.  [name] eventually presented to [facility] Emergency Room where [name] was admitted for further evaluation.  PAST MEDICAL HISTORY:  The patient also has a history of TIAs.  See  other medical history as noted above.  [name] has been on Plavix due to these TIAs.  ALLERGIES:  DEMEROL, MORPHINE and PENICILLIN.  MEDICATIONS PRIOR TO ADMISSION:  1. Atenolol 25 mg daily.  2. Plavix 75 mg daily.  3. [name] is currently on a prednisone taper.  4. Takes Pentasa for her Crohns disease.  5. [name] takes Ativan one-half tablet at bedtime as needed for sleep.  SOCIAL HISTORY:  Patient lives alone in [place].  [name] is widowed.  [name] has two sons.  [name] has a remote tobacco history.  [name] does not use alcohol.  HOSPITAL COURSE:  This patient was transferred to [facility] on Feb 21, 2001 for further evaluation of chest pain after initially being admitted to [facility] by Dr. Kingsley Callander. Fagan, III.  Cardiac enzymes were found to be negative for an MI.  The patient underwent cardiac catheterization on Feb 24, 2001, revealing essentially normal coronary arteries except for a mid 50% lesion of the RCA, which was a dominant artery.  Her ejection fraction was 65%. Dr. Rollene Rotunda felt that [name] had nonobstructive coronary disease with normal LV function and recommended risk factor modification.  It was also felt that [name] should be worked up for a non-anginal chest pain and treated for hyperlipidemia.  It was felt that [name] also might need further evaluation for  palpitations.  The monitor during her hospital stay showed normal sinus rhythm without ectopics.  LABORATORY DATA:  As noted, cardiac enzymes were negative.  The patients glucose was noted to be running high during her stay, believed to be secondary to her prednisone.  A hemoglobin A1c was 6.1.  A lipid profile on Feb 22, 2001 revealed cholesterol of 227, triglycerides 72, HDL 54, LDL was 159.  A CBC revealed hemoglobin 15.9, hematocrit 47.4, WBC of 10,800, platelets 403,000.  A chest x-ray showed COPD with mild bibasilar atelectasis.  An EKG showed normal sinus rhythm with occasional premature  supraventricular complexes.  There were T wave abnormalities consistent with ischemia in leads V2 through V6.  DISCHARGE MEDICATIONS:  1. The patient was started on Lipitor 20 mg daily during this admission.  [name] was told to continue her:  1. Atenolol 25 mg daily.  2. Plavix 75 mg daily.  3. Pentasa as previously taken.  4. Ativan as previously taken.  5. Prednisone as instructed.  ACTIVITIES:  The patient was told to avoid any strenuous activity or any heavy lifting more than 10 pounds for at least 1 week.  DIET:  [name] was to be on a low-salt, low-fat diet.  [name] was told to avoid sweets due to her elevated glucose.  SPECIAL DISCHARGE INSTRUCTIONS:  [name] was to call the office if [name] had any increased pain, swelling or bleeding from her groin.  [name] was to have a lipid profile and liver function tests in approximately eight weeks.  [name] needs further evaluation for palpitations.  FOLLOWUP:  [name] was told to call the [place] Office on Tuesday for a followup appointment with Dr. Maisie Fus C. Wall in approximately two weeks.  [name] was to follow up with Dr. Ouida Sills in approximately two weeks.  [name] was to have her elevated glucose levels evaluated.  PROBLEM LIST AT TIME OF DISCHARGE:  1. Chest pain, abnormal electrocardiogram, negative enzymes.  2. Cardiac catheterization, Feb 24, 2001:  Essentially normal coronary     arteries except for 50% right coronary artery lesion and normal left     ventricle.  3. Chronic obstructive pulmonary disease by chest x-ray.  4. Elevated glucose levels felt to be secondary to recent prednisone therapy     with hemoglobin A1c being 6.1.  5. Elevated lipids, treated initiated.  6. History of Crohns disease.  7. Remote tobacco history.  8. History of supraventricular tachycardia and recent palpitations.  9. History of transient ischemic attacks. 10. History of spinal stenosis. 11. History of previous cardiac catheterization in 1997. 12. Status post multiple  surgeries. 13. History of false-positive Cardiolite stress test in 1997. DD:  02/24/01 TD:  02/24/01 Job: [id number]  [id number]

## 2011-02-16 NOTE — H&P (Signed)
[facility]  Patient:    [name], [name] Visit Number: [id number] MRN: [id number]          Service Type: DSU Location: DAY Attending Physician:  Dessa Phi Dictated by:   Elpidio Anis, M.D.   CC:         Carylon Perches, M.D.   History and Physical  HISTORY OF PRESENT ILLNESS:  A 77 year old female with a history of chronic cystic changes of her left breast.  Patient has had monitoring by mammography and ultrasound since December 2001.  There has been no decrease in size of the mass in the interval.  The mass is described as a 7.1 x 6.5 x 5.6 mm complex cystic lesion in the 3 oclock position of the left breast.  In March 2002, the nodule was 7 x 5 mm.  It is felt to have mixed echo appearance with some septation.  The patient has a major concern because of the mass and sought a second opinion in early October.  Because she has had some drainage from the left nipple, she is concerned about early carcinoma.  Though the mass is possibly benign, it is felt that it should be excised.  She is scheduled for needle localization and partial mastectomy.  PAST MEDICAL HISTORY:  She has atherosclerotic heart disease, a history of Crohns disease, history of TIAs with her last TIA in 1997.  She has chronic spinal stenosis.  Last hospitalization because of these was in August 2000. She had a cardiac catheterization in May 2002 and I do not have the results of that study.  PAST SURGICAL HISTORY:  Bilateral breast biopsies, total abdominal hysterectomy, exploratory laparotomy, bilateral salpingo-oophorectomy, and cholecystectomy.  MEDICATIONS: 1. Plavix 75 mg q.d. 2. Zocor 20 mg q.d. 3. Atenolol 75 mg q.d. 4. Pentasa four tablets b.i.d.  REVIEW OF SYSTEMS:  She has had multiple falls due to leg pains.  She has not had any weight loss.  She has mild shortness of breath and dyspnea on exertion.  She has a history of alternating constipation and  diarrhea.  ALLERGIES:  DEMEROL, PENICILLIN, MORPHINE.  SOCIAL HISTORY:  She does not drink, smoke, or use drugs.  She is a widower, retired.  PHYSICAL EXAMINATION:  VITAL SIGNS:  Blood pressure 136/82, pulse 60, respirations 16, weight 203 pounds.  HEENT:  Normal.  NECK:  Supple without JVD or bruit.  CHEST:  Clear to auscultation.  HEART:  Regular rate and rhythm without murmur, gallop, or rub.  ABDOMEN:  Soft, nontender, no masses.  BREASTS:  Bilateral diffuse nodular breasts without palpable definite nodule.  EXTREMITIES:  No cyanosis, clubbing, or edema.  NEUROLOGIC:  Fully oriented to person, place, and time.  Cranial nerves intact.  Gait and stance are intact.  Deep tendon reflexes are symmetric and normal.  IMPRESSION: 1. Left breast mass. 2. Atherosclerotic heart disease. 3. History of Crohns disease.  PLAN:  Patient will have needle localization with partial mastectomy for removal of the mass. Dictated by:   Elpidio Anis, M.D. Attending Physician:  Dessa Phi DD:  08/11/01 TD:  08/11/01 Job: [id number]

## 2011-02-16 NOTE — H&P (Signed)
NAME:  [name], [name]                          ACCOUNT NO.:  [id number]   MEDICAL RECORD NO.:  [id number]                   PATIENT TYPE:  OBV   LOCATION:  A303                                 FACILITY:  [facility]   PHYSICIAN:  Gracelyn Nurse, M.D.              DATE OF BIRTH:  1927-02-25   DATE OF ADMISSION:  01/08/2003  DATE OF DISCHARGE:                                HISTORY & PHYSICAL   CHIEF COMPLAINT:  Left arm weakness.   HISTORY OF PRESENT ILLNESS:  This is a 77 year old white female with a  history of multiple TIAs in the past. Tonight she presents with left arm  weakness and mild confusion.  She went to sleep earlier this evening and  then awoke with difficulty getting her words and using her left arm.  She  said that the symptoms have improved since presenting to the ER, but she is  still having some mild weakness in the left arm.  She has no other  complaints.   PAST MEDICAL HISTORY:  Multiple TIAs, Crohn's disease, spinal stenosis,  hypertension, and hyperlipidemia.   ALLERGIES:  Penicillin, morphine, and codeine.   CURRENT MEDICATIONS:  1. Pentasa 1 gm q.i.d.  2. Plavix 75 mg daily.  3. Zetia 2 mg daily.  4. Atenolol 25 mg daily.  5. Ativan 0.5 mg p.r.n.   SOCIAL HISTORY:  She lives alone, is widowed, has two children does not  smoke and does not drink any alcohol.   FAMILY HISTORY:  Her mother has dementia and she is 101.  Father died at age  75 of an MI.   REVIEW OF SYSTEMS:  As per HPI.  She also has frequent abdominal pain.  The  remainder of the systems are negative.   PHYSICAL EXAMINATION:  VITAL SIGNS:  Temperature is 98.6, pulse 83,  respirations 16, blood pressure 162/81.  GENERAL:  This is a well-nourished white female in no acute distress.  HEENT:  Pupils are equal, round, and reacted to light.  Extraocular  movements are intact.  Oral mucosa is moist.  Oropharynx is clear.  CARDIOVASCULAR:  Regular rate and rhythm no murmurs.  LUNGS:  Clear to  auscultation.  ABDOMEN:  Obese, nondistended.  Bowel sounds positive.  There is some mild  tenderness in the right lower quadrant.  No rebound or guarding.  EXTREMITIES:  1+ lower extremity edema.  NEUROLOGIC:  Cranial nerves II-XII grossly intact.  Strength is 5/5 in the  right upper extremity, 4/5 in the left upper extremity and 5/5 in the lower  extremities bilateral.  SKIN: Moist with no rash.   X-RAY AND LABORATORY DATA:  Admitting Labs:  White blood cells 6.9,  hemoglobin 15, platelets 281.  Sodium 137, potassium 3.4, chloride 105, CO2  26, BUN 11, creatinine 0.8, glucose 105.  CK 65, MB fraction 2.3, troponin I  is 0.01.  A CT of the brain showed  multiple meningiomas.  No change since  2001.   ASSESSMENT AND PLAN:  1. Transient ischemic attack.  Symptoms fit this diagnosis.  She has had     problems with this in the past.  She is currently on Plavix. Will add     some enteric coated aspirin to this.  Admit her for observation. Her     symptoms are improving.  If they continue to improve we may be able to     discharge her home in the next day or two.  If they do not resolve then     may need to get an MRI for a more detailed look at the brain.  2. Hypertension.  Will continue her on her atenolol.  We do not want to     lower her blood pressure abruptly in the setting of a TIA.  3. Crohn's disease.  She is currently on Pentasa and we will continue this.                                               Gracelyn Nurse, M.D.    JDJ/MEDQ  D:  01/09/2003  T:  01/09/2003  Job:  [id number]

## 2011-02-16 NOTE — Op Note (Signed)
NAME:  [name], [name]                ACCOUNT NO.:  [id number]   MEDICAL RECORD NO.:  [id number]          PATIENT TYPE:  AMB   LOCATION:  DAY                           FACILITY:  [facility]   PHYSICIAN:  Lionel December, M.D.    DATE OF BIRTH:  06-26-1927   DATE OF PROCEDURE:  10/26/2004  DATE OF DISCHARGE:                                 OPERATIVE REPORT   PROCEDURE:  Esophagogastroduodenoscopy with esophageal dilation.   ENDOSCOPIST:  Lionel December, M.D.   INDICATIONS:  [name] is a 77 year old Caucasian female who keeps coming back  with dysphagia to solids. She also complains of bleeding and a lump and also  a constant need to clear her throat.  She had EGD with ED in September 2005.  She had noncritical ring.  She had mild epiglottic edema.  Her esophagus was  empirically dilated by passing 54-56-French Maloney dilators.  She reported  complete resolution of her dysphagia, but this only lasted for 2 weeks.  She  was brought back in October and had a barium study which suggested prominent  cricopharyngeus, noncritical Schatzki's ring and hiatal hernia.  She has  been maintained on PPI, and she rarely experiences heartburn.  She is  requesting that her  upper GI tract be reevaluated and dilated.   Procedures were reviewed with the patient.  Informed consent was obtained.   PREOPERATIVE MEDICATION:  Cetacaine spray for pharyngeal topical anesthesia.  Fentanyl 50 mcg IV, Versed 6 mg IV in divided dose.   FINDINGS:  The procedure performed in endoscopy suite.  The patient's vital  signs and O2 sat were monitored during procedure and remained stable.  The  patient was placed left lateral recumbent position, and the Olympus  videoscope was passed via oropharynx without any difficulty into esophagus.  The laryngopharynx was well seen on the _________.  There was mild edema at  the epiglottis, which was unchanged. The laryngeal inlet appeared to be  unremarkable.  Scarpa's also seen at the  tonsillar fossa on the way out.   Esophagus:  The mucosa of the esophagus was normal.  There was incomplete  ring at GE junction, which was felt to be noncritical.  Gastric mucosa at  the GE junction was erythematous, but there were no erosions or ulcers  noted.  The GE junction was located at 38 cm.  The hiatus was at 41 cm.   The stomach was empty and distended very well insufflation.  Folds of the  proximal stomach were normal.  Examination of the mucosa revealed a few  areas of specks of coffee-ground.  The underlying mucosa was normal.  No  erosions or ulcers noted.  Pyloric channel was patent.  Angularis, fundus  and cardia were examined by retroflexing the scope and were normal.   Duodenum and bulbar mucosa was normal.  The scope was passed to the second  part of duodenum where mucosa and folds were normal.  Endoscope was  withdrawn.   Esophagus was dilated by passing a 58-French Maloney dilator to full  insertion. As the dilator was withdrawn, the endoscope was passed  again, and  there was no mucosal injury noted to the esophagus.   The patient tolerated the procedure well.   FINAL DIAGNOSES:  1.  Mild epiglottic edema, which was also noted on      esophagogastroduodenoscopy of September 2005.  2.  Incomplete noncritical Schatzki's ring with 3 cm size sliding hiatal      hernia.  3.  Mild changes of reflux esophagitis limited to gastroesophageal junction.  4.  Esophagus was dilated by passing 58-French Maloney dilator, and it      resulted without causing mucosal disruption.   RECOMMENDATIONS:  1.  She will continue anti-reflux measures. Will increase the Nexium 40 mg      before breakfast and evening meal.  2.  If she remains with her symptoms, would consider multipolar impedance      study along with pH monitoring.  The patient will call us with progress      report in one week.      NR/MEDQ  D:  10/26/2004  T:  10/26/2004  Job:  [id number]   cc:   Kingsley Callander. Ouida Sills, MD   [address]  [place]  [place] [address]  Fax: [phone]   Suzanna Obey, M.D.  [address] [place]  [place] [address]  Fax: [phone]   Wynn Banker, M.D.  [address] [facility]  [place]  [place] [address]  Fax: [phone]

## 2011-02-16 NOTE — Op Note (Signed)
NAME:  [name], [name]                ACCOUNT NO.:  [id number]   MEDICAL RECORD NO.:  [id number]          PATIENT TYPE:  AMB   LOCATION:  SDS                          FACILITY:  [facility]   PHYSICIAN:  Suzanna Obey, M.D.       DATE OF BIRTH:  04/10/1927   DATE OF PROCEDURE:  07/12/2006  DATE OF DISCHARGE:  07/12/2006                                 OPERATIVE REPORT   PREOPERATIVE DIAGNOSIS:  Right tongue lesion.   POSTOPERATIVE DIAGNOSIS:  Right tongue lesion.   SURGICAL PROCEDURE:  Direct laryngoscopy, with right tongue biopsy.   ANESTHESIA:  General.   ESTIMATED BLOOD LOSS:  Less than 5 cc.   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  She has now had persistent pain in the  right side of her tongue that is concerning for recurrence, and she is very  concerned about it.  There is no obvious lesion that is consistent with  recurrence on the examination in the office, but she does have some  thickened tissue in the lingual sulcus on the right side.  She has very  sensitive gag reflex, so palpation of the area was impossible in the office.  She was informed of the risks and  benefits of the procedure, including  bleeding, infection, scarring, dysphagia, and risk of the anesthetic.  All  questions were answered, and consent was obtained.   OPERATION:  The patient was taken to the operating room and placed in the  supine position.  After adequate general endotracheal tube anesthesia, she  was examined with the direct laryngoscopy.  There were no obvious lesions  anywhere in the larynx or hypopharynx or oral cavity.  The oropharynx also  had no lesions, and by palpation the tongue and lateral pharyngeal wall on  both sides felt normal.  The right tongue had a little bit of what appears  to be hypertrophic lymph tissue and the very posterior aspect of the lingual  sulcus, and this was biopsied multiple times, as this was the  identified  irregularity in the office.  This was sent for permanent section.  There was  no palpable mass in the neck.  Good hemostasis was achieved with an  epinephrine-soaked pledget.  She was then suctioned out and awakened,  brought to the recovery room in stable condition.  Counts correct.           ______________________________  Suzanna Obey, M.D.     JB/MEDQ  D:  07/12/2006  T:  07/13/2006  Job:  [id number]

## 2011-02-16 NOTE — Cardiovascular Report (Signed)
[facility]  Patient:    [name], [name]                       MRN: [id number] Proc. Date: 02/24/01 Adm. Date:  [id number] Attending:  Lewayne Bunting                        Cardiac Catheterization  PRIMARY PHYSICIAN:  Carylon Perches, M.D.  CARDIOLOGIST:  Jesse Sans. Wall, M.D.  PROCEDURE:  Left heart cardiac catheterization/coronary arteriography.  INDICATIONS:  Evaluate patient with chest pain.  DESCRIPTION OF PROCEDURE:  Left heart catheterization was performed via the right femoral artery.  The artery was cannulated using an anterior wall puncture.  A #6 French arterial sheath was inserted via the modified Seldinger technique.  Preformed Judkins and a pigtail catheter were utilized.  The patient tolerated the procedure well and left the lab in stable condition.  RESULTS:  HEMODYNAMICS:  LV 185/13, AO 185/82.  CORONARY ARTERIOGRAPHY:  Left main:  The left main coronary artery was normal.  Left anterior descending:  The LAD had proximal luminal irregularities.  The mid LAD also had proximal luminal irregularities.  Circumflex:  The circumflex was a large vessel, though nondominant.  It was tortuous, particularly in a large mid obtuse marginal.  There were diffuse luminal irregularities.  Right coronary artery:  The right coronary artery was dominant.  It was a mid 50% lesion with slight aneurysmal dilatation proximal to this.  LEFT VENTRICULOGRAM:  The left ventriculogram was obtained in the RAO projection.  The EF was somewhat difficult to estimated secondary to ventricular ectopy.  However, there did not appear to be any wall motion abnormalities with an overall ejection fraction of approximately 65%.  CONCLUSION:  Nonobstructive coronary artery disease with well preserved left ventricular function.  PLAN:  The patient will have secondary risk factor modification including management of her hyperlipidemia.  She will be followed by Dr. Ouida Sills  for work-up of non-anginal chest pain. DD:  02/24/01 TD:  02/24/01 Job: [id number]

## 2011-02-16 NOTE — Discharge Summary (Signed)
   NAME:  [name], [name]                            ACCOUNT NO.:  [id number]   MEDICAL RECORD NO.:  [id number]         PATIENT TYPE:   LOCATION:                                       FACILITY:   PHYSICIAN:  Kingsley Callander. Ouida Sills, M.D.                  DATE OF BIRTH:   DATE OF ADMISSION:  05/11/2002  DATE OF DISCHARGE:  05/15/2002                                 DISCHARGE SUMMARY   DISCHARGE DIAGNOSES:  1. Crohn's disease.  2. Microhematuria.  3. History of transient ischemic attack.  4. Spinal stenosis.Marland Kitchen  5. Hypertension.  6. Hypercholesterolemia.   HOSPITAL COURSE:  This patient is a 77 year old white female who presented  with right lower quadrant pain radiating somewhat to her groin area.  She  initially had 2 to 3+ blood in the urine.  She had experienced some  diaphoresis at home.  Her white count was 8.6 with a hemoglobin of 15.  She  denied rectal bleeding.  Her sed rate was 14.  She was hypokalemic at 3.3.  Creatinine was 0.8.  A CT scan without contrast initially revealed no  evidence of ureteral stone.  A gastroenterology consultation was obtained  with Dr. Lionel December.  She had a repeat CT scan with oral and IV contrast  which revealed thickening of the ileum consistent with Crohn's disease in  the right pelvis.  No fistula or abscess was present.   Her Pentasa was increased to 1 g q.i.d.  She was treated with Solu-Medrol.  Entocort EC was added.   Her potassium normalized at 4.6 with supplementation.   A urine culture has thus far been negative.  A urine for cytology is  pending; this will be followed up as an outpatient.   Her right lower quadrant pain was somewhat improved.  She was felt to be  stable for discharge on May 15, 2002.   FOLLOWUP:  She will follow up in the office in two to three weeks.   DISCHARGE MEDICATIONS:  1. Pentasa 1 g q.i.d.  2. Entocort EC 9 mg q.d. for two weeks, 6 mg q.d. for two weeks, 3 mg q.d.     for two weeks.  3. Plavix 75 mg q.d.  4. Aspirin 325 mg q.d.  5.     Zocor 20 mg q.d.  6. Atenolol 25 mg q.d.  7. Lorazepam 1 mg t.i.d. p.r.n.                                               Kingsley Callander. Ouida Sills, M.D.    ROF/MEDQ  D:  05/15/2002  T:  05/15/2002  Job:  [id number]   cc:   Lionel December, M.D.

## 2011-02-16 NOTE — H&P (Signed)
NAME:  [name], [name]                ACCOUNT NO.:  [id number]   MEDICAL RECORD NO.:  [id number]          PATIENT TYPE:  INP   LOCATION:                               FACILITY:  [facility]   PHYSICIAN:  Ines Bloomer, M.D. DATE OF BIRTH:  01-17-1927   DATE OF ADMISSION:  04/05/2005  DATE OF DISCHARGE:                                HISTORY & PHYSICAL   CHIEF COMPLAINT:  Spots on the lung.   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  At that time, she was found to have a small lung  nodule and the PET scan was negative; however, repeat CT scan showed that  these nodules have increased.  There is actually two to three nodules on the  left, two in the upper lobe and one in the lower lobe, and a small one in  the right lower lobe.  The nodules have over three months have increased in  size; the one on the right from 2 to 4 mm and on the left from 6 to 9 mm.  There is also on CT scan with a density at T11-12 area that could possibly  be an early Schwannoma.  She quit smoking five years ago.  Has had no  hemoptysis, weight loss or excessive sputum.   PAST MEDICAL HISTORY:  She has hypertension, hypercholesterolemia.   ALLERGIES:  1.  DEMEROL.  2.  MORPHINE.   CURRENT MEDICATIONS:  1.  Atenolol 25 mg a day.  2.  Plavix 75 mg a day.  3.  Crestor 10 mg a day.  4.  Nexium 20 mg a day.  5.  Xanax 0.25 as needed.   FAMILY HISTORY:  Negative for vascular disease and cancer.   SOCIAL HISTORY:  She is widowed.  As mentioned, she did not smoke; quit  smoking five years ago.  Does not drink alcohol.   REVIEW OF SYSTEMS:  She is 176 pounds.  She is 5 feet 5 inches.  Weight has  been stable.  CARDIAC:  She has no angina or arrhythmias.  PULMONARY:  She  gets shortness of breath with  exertion.  Denies hemoptysis. GI: No nausea,  vomiting, constipation or diarrhea occurred.  GU:  No dysuria or frequent  urination.  VASCULAR:  She has no claudication or DVTs.  Has question of  having some ministrokes in the past.  She has chronic joint pain and  arthritis.  NEUROLOGIC:  No history of headaches, blackouts, or seizures.  ENT:  No change in her eyesight or hearing.  PSYCHIATRIC:  No psychiatric  illnesses.  HEMATOLOGIC:  Denies any history of anemia.   PHYSICAL EXAMINATION:  GENERAL:  She is a well-developed Caucasian female in  no acute distress.  VITAL SIGNS:  Blood pressure is 100/60, pulse 72, respirations 18.  Saturations were 96%.  HEENT:  Eyes:  Pupils equal, reactive to light and accommodation.  Nose:  No  septal deviation.  Throat:  On the right tonsillar pillar, there is erythema  and thickening.  She has no supraclavicular or axillary adenopathy.  There  is a well-healed thyroid scar.  CHEST:  Clear to auscultation and percussion.  HEART:  Regular sinus rhythm.  No murmurs.  ABDOMEN:  Soft.  Bowel sounds are normal.  There is no hepatosplenomegaly.  EXTREMITIES:  Pulses 2+.  There is no clubbing or edema.  SKIN:  Without lesions.  NEUROLOGICAL:  She is oriented x3.  Cranial nerves II-XII are intact.  Sensory and motor intact.   IMPRESSION:  1.  Status post radiation in the right tonsillar pillar.  2.  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  3.  Type 3 hypertension.  4.  Hypercholesterolemia.  5.  Enlarging bilateral pulmonary nodules.   PLAN:  Left VATS resection of pulmonary nodules.           ______________________________  Ines Bloomer, M.D.     DPB/MEDQ  D:  04/03/2005  T:  04/03/2005  Job:  [id number]

## 2011-02-16 NOTE — Op Note (Signed)
NAME:  [name], [name]                ACCOUNT NO.:  [id number]   MEDICAL RECORD NO.:  [id number]          PATIENT TYPE:  INP   LOCATION:  2899                         FACILITY:  [facility]   PHYSICIAN:  Ines Bloomer, M.D. DATE OF BIRTH:  02-Jan-1927   DATE OF PROCEDURE:  04/05/2005  DATE OF DISCHARGE:                                 OPERATIVE REPORT   PREOPERATIVE DIAGNOSIS:  Bilateral pulmonary nodules with questionable right  T11 Schwannoma.   POSTOPERATIVE DIAGNOSIS:  Bilateral pulmonary nodules with questionable  right T11 Schwannoma.  Probable metastatic cancer, lung.   OPERATION PERFORMED:  Left video assisted thoracoscopic surgery, wedge  resection times two.   SURGEON:  Ines Bloomer, M.D.   ANESTHESIA:  General.   INDICATIONS FOR PROCEDURE:  This 77 year old patient had had a treatment of  a tonsillar cancer and then developed three pulmonary nodules, two on the  left and one on the right.  Incidentally, there was a question of a small 1  cm Schwannoma, too, on the right side at T11-12.  Because we felt this was  probable metastatic cancer, it was decided to do the left side and wedge the  two lesions out.   DESCRIPTION OF PROCEDURE:  After general anesthesia, was turned to the left  lateral thoracotomy position and was prepped and draped in the usual sterile  manner.  Two trocar sites were made in the anterior and posterior axillary  line at the seventh intercostal space.  Two trocars were inserted.  A 30  degree scope was inserted.  Incision was made along the fifth intercostal  space and a small minithoracotomy was inserted and the Tuffier and a small  pediatric retractor was inserted.  We were able to palpate the two lesions  in the left upper lobe and no lesions were palpated in the left lower lobe.  The first lesion was grasped.  The large lesion was grasped with a Duvall  lung clamp and wedged out with two applications of the EZ-45 stapler.  Small  incision was  located in the anterior segment which was about 4 to 5 mm.  It  was grasped with the lung clamp and wedged with the EZ-45 stapler.  No other  lesions were palpated.  CoSeal was applied to the staple line.  Two chest  tubes were brought in through the trocar sites and sutured in place with 0  silk.  An On-Q catheter was placed under direct vision with the scope,  subpleurally and the paravertebral space and held in place with Steri-  Strips.  Marcaine block was done in the usual fashion.  Chest was closed  with two pericostals of #1 Vicryl in the muscle layer, 2-0 Vicryl in the  subcutaneous tissue and 3-0 Vicryl as a subcuticular stitch.  The patient  was then transferred to the recovery room in stable condition.       DPB/MEDQ  D:  04/05/2005  T:  04/05/2005  Job:  [id number]

## 2011-02-16 NOTE — Op Note (Signed)
NAME:  [name], [name]                ACCOUNT NO.:  [id number]   MEDICAL RECORD NO.:  [id number]          PATIENT TYPE:  OIB   LOCATION:  2550                         FACILITY:  [facility]   PHYSICIAN:  Suzanna Obey, M.D.       DATE OF BIRTH:  October 18, 1926   DATE OF PROCEDURE:  DATE OF DISCHARGE:                                 OPERATIVE REPORT   PREOPERATIVE DIAGNOSIS:  Left thyroid mass.   POSTOPERATIVE DIAGNOSIS:  Left thyroid mass.   PROCEDURE:  Left thyroidectomy.   ANESTHESIA:  General endotracheal tube anesthesia.   ESTIMATED BLOOD LOSS:  Less than 5 mL.   INDICATIONS FOR PROCEDURE:  This is a 77 year old who has had a thyroid mass  on the left side which has enlarged since the last study.  Needle biopsy  showed benign cells.  The patient is very nervous about having the mass and  wants it out despite the findings.  She was informed of the risks and  benefits of the procedure including bleeding, infection, scarring, permanent  hoarseness, permanent hypocalcemia problems, need for thyroid replacement,  and risk of the anesthetic.  All questions were answered and consent was  obtained.   DESCRIPTION OF PROCEDURE:  Patient taken to the operating room and placed in  the supine position. After adequate general endotracheal tube anesthesia,  was placed in the supine position, prepped and draped in the usual sterile  manner.  The incision was opened at the base of the neck along a skin crease  with electrocautery and flaps were elevated superiorly and inferiorly and  self-retaining retractor was placed.  The midline was identified and cautery  was used to divide the division of the strap muscles and the thyroid isthmus  was encountered.  The dissection was carried along the strap muscles,  carrying them laterally and exposing the thyroid mass.  It was very easy to  see.  Following the capsule very carefully and right on the capsule, the  dissection was carried out and it went smoothly with  dividing the vessels.  The area where the nerve was located was identified and preserved.  The  thyroid was then brought off Berry's ligament and cauterized off the  anterior trachea and the isthmus was divided with hemostat and silk suture  ligature.  The parathyroid gland, one of them, had been identified and left  in the thyroid bed and preserved.  The wound was copiously irrigated.  Frozen section was sent showing follicular lesion  favoring benign.  The wound was closed with interrupted 4-0 chromic and a #7  JP drain was placed and secured with a 5-0 nylon.  The skin was closed with  running 5-0 nylon.  The patient was awakened and brought to recovery in  stable condition. Counts correct.      JB/MEDQ  D:  01/26/2005  T:  01/26/2005  Job:  [id number]   cc:   Kingsley Callander. Ouida Sills, MD  [address]  [place]  [place] [address]  Fax: [phone]

## 2011-02-16 NOTE — Discharge Summary (Signed)
NAME:  [name], [name]                ACCOUNT NO.:  [id number]   MEDICAL RECORD NO.:  [id number]          PATIENT TYPE:  INP   LOCATION:  3307                         FACILITY:  [facility]   PHYSICIAN:  Ines Bloomer, M.D. 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  Jearld Fenton with radiation.  She also has a history of thyroidectomy for benign  lesions.  During this evaluation she was found to have a small lung nodule  and the PET scan was negative; however, repeat CT scan showed the nodule  increased.  There was noted to be 2 or 3 nodules on the left, 2 in the upper  lobe and 1 in the lower lobe, as well as a small lesion on the right lower  lobe.  As the size of these nodules has increased in size, it was Dr.  Scheryl Darter opinion that she should undergo video-assisted thoracoscopy for  diagnostic purposes.  Additionally of note, there was a CT scan finding of a  density at T11, T12 which was felt to be possible schwannoma.  She was  admitted this hospitalization for the procedure.   PAST MEDICAL HISTORY:  Includes hypertension and hypercholesterolemia as  well as the above-mentioned diagnoses.   ALLERGIES:  1.  DEMEROL.  2.  MORPHINE.   MEDICATIONS PRIOR TO ADMISSION:  1.  Atenolol 25 mg daily.  2.  Plavix 75 mg daily.  3.  Crestor 10 mg daily.  4.  Nexium 20 mg daily.  5.  Xanax 0.25 mg p.r.n., dosing schedule not listed in history and      physical.   FAMILY HISTORY:  Is negative for vascular disease, negative for cancer.   SOCIAL HISTORY:  She does have a history of tobacco use, having quit smoking  5 years ago.  She uses no alcohol.   REVIEW OF SYMPTOMS AND PHYSICAL EXAM:  Please see the History and Physical  done at time of  admission.   HOSPITAL COURSE:  The patient was admitted on April 05, 2005, taken to the  operating room where she underwent a left video-assisted thoracoscopic  surgery, wedge resection x2.  Procedure was performed by Dr. Edwyna Shell. Patient  tolerated well, was taken to the Post Anesthesia Care Unit in stable  condition.   POSTOPERATIVE HOSPITAL COURSE:  The patient has done well.  All routine  lines, monitors and drainage devices were discontinued in the standard  fashion.  Incision is healing well.  She has tolerated oxygen weaning and  maintained good saturations on room air.  Her pathology has revealed the  following:  1.  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  Additionally,      there was emphysematous changes.  The second left upper lobe lung  biopsy      also revealed metastatic poorly differentiated basaloid squamous cell      carcinoma.  This also had negative margins for tumor and emphysematous      changes.  Currently the patient is clinically doing quite well in her      recovery for the surgery.  Her status is felt to be adequate for      tentative discharge in the morning of April 09, 2005, pending morning      round re-evaluation.   MEDICATIONS ON DISCHARGE:  As pre-admission.  For pain, Tylenol 1 or 2 every  6 hours as needed.   INSTRUCTIONS:  The patient received written instructions in regard to  medications, activity, diet, wound care and followup.   FOLLOW UP:  Will include Dr. Edwyna Shell in 1 week with a repeat chest x-ray at  that time.   FINAL DIAGNOSIS:  Metastatic carcinoma as described in the above dictation.   OTHER DIAGNOSES:  As previously listed per the history.      Rosine Abe  D:  04/08/2005  T:  04/08/2005  Job:  [id number]   cc:   Ines Bloomer, M.D.  [address]  [place]  [place] [address]

## 2011-02-27 ENCOUNTER — Encounter (HOSPITAL_COMMUNITY): Payer: Medicare Other | Attending: Oncology

## 2011-02-27 DIAGNOSIS — Z452 Encounter for adjustment and management of vascular access device: Secondary | ICD-10-CM

## 2011-02-27 DIAGNOSIS — C760 Malignant neoplasm of head, face and neck: Secondary | ICD-10-CM

## 2011-04-03 ENCOUNTER — Encounter (HOSPITAL_COMMUNITY): Payer: Medicare Other | Attending: Oncology

## 2011-04-03 DIAGNOSIS — Z452 Encounter for adjustment and management of vascular access device: Secondary | ICD-10-CM

## 2011-04-03 DIAGNOSIS — C760 Malignant neoplasm of head, face and neck: Secondary | ICD-10-CM

## 2011-04-26 ENCOUNTER — Other Ambulatory Visit: Payer: Self-pay | Admitting: Otolaryngology

## 2011-04-27 ENCOUNTER — Other Ambulatory Visit (HOSPITAL_COMMUNITY): Payer: Self-pay | Admitting: Otolaryngology

## 2011-04-27 DIAGNOSIS — R220 Localized swelling, mass and lump, head: Secondary | ICD-10-CM

## 2011-05-01 ENCOUNTER — Ambulatory Visit (HOSPITAL_COMMUNITY)
Admission: RE | Admit: 2011-05-01 | Discharge: 2011-05-01 | Disposition: A | Discharge status: Home / Self Care | Payer: Medicare Other | Source: Ambulatory Visit | Attending: Otolaryngology | Admitting: Otolaryngology

## 2011-05-01 ENCOUNTER — Encounter (HOSPITAL_COMMUNITY): Payer: Self-pay

## 2011-05-01 DIAGNOSIS — E079 Disorder of thyroid, unspecified: Secondary | ICD-10-CM | POA: Insufficient documentation

## 2011-05-01 DIAGNOSIS — R070 Pain in throat: Secondary | ICD-10-CM | POA: Insufficient documentation

## 2011-05-01 DIAGNOSIS — R220 Localized swelling, mass and lump, head: Secondary | ICD-10-CM

## 2011-05-01 DIAGNOSIS — Z85819 Personal history of malignant neoplasm of unspecified site of lip, oral cavity, and pharynx: Secondary | ICD-10-CM | POA: Insufficient documentation

## 2011-05-01 DIAGNOSIS — Z85118 Personal history of other malignant neoplasm of bronchus and lung: Secondary | ICD-10-CM | POA: Insufficient documentation

## 2011-05-01 DIAGNOSIS — Z9221 Personal history of antineoplastic chemotherapy: Secondary | ICD-10-CM | POA: Insufficient documentation

## 2011-05-01 LAB — CREATININE, SERUM
GFR calc Af Amer: >60 mL/min (ref >60)
GFR calc non Af Amer: >60 mL/min (ref >60)

## 2011-05-01 MED ORDER — IOHEXOL 300 MG/ML  SOLN
75.0000 mL | Freq: Once | INTRAMUSCULAR | Status: AC | PRN
  Administered 2011-05-01: 75 mL via INTRAVENOUS

## 2011-05-03 ENCOUNTER — Other Ambulatory Visit (HOSPITAL_COMMUNITY): Payer: Self-pay | Admitting: Otolaryngology

## 2011-05-03 DIAGNOSIS — E041 Nontoxic single thyroid nodule: Secondary | ICD-10-CM

## 2011-05-10 ENCOUNTER — Ambulatory Visit (HOSPITAL_COMMUNITY)
Admission: RE | Admit: 2011-05-10 | Discharge: 2011-05-10 | Disposition: A | Discharge status: Home / Self Care | Payer: Medicare Other | Source: Ambulatory Visit | Attending: Otolaryngology | Admitting: Otolaryngology

## 2011-05-10 ENCOUNTER — Other Ambulatory Visit (HOSPITAL_COMMUNITY): Payer: Self-pay | Admitting: Otolaryngology

## 2011-05-10 ENCOUNTER — Ambulatory Visit (HOSPITAL_COMMUNITY)
Admission: RE | Admit: 2011-05-10 | Discharge: 2011-05-10 | Payer: Medicare Other | Source: Ambulatory Visit | Attending: Otolaryngology | Admitting: Otolaryngology

## 2011-05-10 DIAGNOSIS — E0789 Other specified disorders of thyroid: Secondary | ICD-10-CM | POA: Insufficient documentation

## 2011-05-10 DIAGNOSIS — E041 Nontoxic single thyroid nodule: Secondary | ICD-10-CM

## 2011-05-10 DIAGNOSIS — Z85819 Personal history of malignant neoplasm of unspecified site of lip, oral cavity, and pharynx: Secondary | ICD-10-CM | POA: Insufficient documentation

## 2011-05-10 DIAGNOSIS — Z85118 Personal history of other malignant neoplasm of bronchus and lung: Secondary | ICD-10-CM | POA: Insufficient documentation

## 2011-05-14 ENCOUNTER — Other Ambulatory Visit (INDEPENDENT_AMBULATORY_CARE_PROVIDER_SITE_OTHER): Payer: Self-pay | Admitting: *Deleted

## 2011-05-14 DIAGNOSIS — K219 Gastro-esophageal reflux disease without esophagitis: Secondary | ICD-10-CM

## 2011-05-14 MED ORDER — ESOMEPRAZOLE MAGNESIUM 40 MG PO CPDR: 40.0000 mg | DELAYED_RELEASE_CAPSULE | Freq: Every day | ORAL | Status: AC

## 2011-05-14 NOTE — Telephone Encounter (Signed)
Rec'd a fax request from Pharmacy for a refill on Nexium 40 mg #30 Take 1 Capule by Mouth Each Morning. Last refilled 03/30/2011

## 2011-05-15 ENCOUNTER — Encounter (HOSPITAL_COMMUNITY): Payer: Medicare Other | Attending: Oncology

## 2011-05-15 DIAGNOSIS — Z452 Encounter for adjustment and management of vascular access device: Secondary | ICD-10-CM

## 2011-05-15 DIAGNOSIS — C760 Malignant neoplasm of head, face and neck: Secondary | ICD-10-CM

## 2011-05-15 MED ORDER — HEPARIN SOD (PORK) LOCK FLUSH 100 UNIT/ML IV SOLN
INTRAVENOUS | Status: AC
  Administered 2011-05-15: 500 [IU] via INTRAVENOUS
  Filled 2011-05-15: qty 5

## 2011-05-15 MED ORDER — SODIUM CHLORIDE 0.9 % IJ SOLN
INTRAMUSCULAR | Status: AC
  Administered 2011-05-15: 10 mL via INTRAVENOUS
  Filled 2011-05-15: qty 10

## 2011-05-15 MED ORDER — HEPARIN SOD (PORK) LOCK FLUSH 100 UNIT/ML IV SOLN
500.0000 [IU] | Freq: Once | INTRAVENOUS | Status: AC
  Administered 2011-05-15: 500 [IU] via INTRAVENOUS

## 2011-05-15 MED ORDER — SODIUM CHLORIDE 0.9 % IJ SOLN
10.0000 mL | Freq: Once | INTRAMUSCULAR | Status: AC
  Administered 2011-05-15: 10 mL via INTRAVENOUS

## 2011-05-15 NOTE — Progress Notes (Signed)
[name] presented for Portacath access and flush. Proper placement of portacath confirmed by CXR. Portacath located right chest wall accessed with  H 20 needle. Good blood return present. Portacath flushed with 20ml NS and 500U/5ml Heparin and needle removed intact. Procedure without incident. Patient tolerated procedure well.   

## 2011-06-12 ENCOUNTER — Encounter (HOSPITAL_COMMUNITY)
Admission: RE | Admit: 2011-06-12 | Discharge: 2011-06-12 | Disposition: A | Discharge status: Home / Self Care | Payer: Medicare Other | Source: Ambulatory Visit | Attending: Otolaryngology | Admitting: Otolaryngology

## 2011-06-12 DIAGNOSIS — Z01810 Encounter for preprocedural cardiovascular examination: Secondary | ICD-10-CM | POA: Insufficient documentation

## 2011-06-12 DIAGNOSIS — Z01812 Encounter for preprocedural laboratory examination: Secondary | ICD-10-CM | POA: Insufficient documentation

## 2011-06-12 LAB — SURGICAL PCR SCREEN
MRSA, PCR: NEGATIVE
Staphylococcus aureus: NEGATIVE

## 2011-06-12 LAB — CBC
Platelets: 293 10*3/uL (ref 150–400)
RBC: 4.74 MIL/uL (ref 3.87–5.11)
WBC: 6.8 10*3/uL (ref 4.0–10.5)

## 2011-06-12 LAB — BASIC METABOLIC PANEL
CO2: 31 mEq/L (ref 19–32)
Chloride: 100 mEq/L (ref 96–112)
Potassium: 4.5 mEq/L (ref 3.5–5.1)
Sodium: 138 mEq/L (ref 135–145)

## 2011-06-12 LAB — DIFFERENTIAL
Basophils Absolute: 0 10*3/uL (ref 0.0–0.1)
Basophils Relative: 0 % (ref 0–1)
Eosinophils Absolute: 0.1 10*3/uL (ref 0.0–0.7)
Eosinophils Relative: 1 % (ref 0–5)
Neutrophils Relative %: 77 % (ref 43–77)

## 2011-06-14 ENCOUNTER — Ambulatory Visit (HOSPITAL_COMMUNITY): Admission: RE | Admit: 2011-06-14 | Payer: Medicare Other | Source: Ambulatory Visit | Admitting: Otolaryngology

## 2011-06-21 ENCOUNTER — Ambulatory Visit (INDEPENDENT_AMBULATORY_CARE_PROVIDER_SITE_OTHER): Payer: Medicare Other | Admitting: Cardiology

## 2011-06-21 ENCOUNTER — Encounter: Payer: Self-pay | Admitting: Cardiology

## 2011-06-21 VITALS — BP 124/75 | HR 73 | Resp 18 | Ht 65.0 in | Wt 197.0 lb

## 2011-06-21 DIAGNOSIS — R9431 Abnormal electrocardiogram [ECG] [EKG]: Secondary | ICD-10-CM

## 2011-06-21 DIAGNOSIS — I10 Essential (primary) hypertension: Secondary | ICD-10-CM | POA: Insufficient documentation

## 2011-06-21 DIAGNOSIS — R079 Chest pain, unspecified: Secondary | ICD-10-CM

## 2011-06-21 DIAGNOSIS — Z01818 Encounter for other preprocedural examination: Secondary | ICD-10-CM

## 2011-06-21 DIAGNOSIS — I6529 Occlusion and stenosis of unspecified carotid artery: Secondary | ICD-10-CM

## 2011-06-21 NOTE — Progress Notes (Signed)
Clinical Summary [name] is a 77 y.o.female referred for cardiology consultation. Recent records are incomplete. She tells me that she has been found to have a new right lobe thyroid mass and was recently scheduled for surgery, only to have it cancelled by Anesthesia during preoperative assessment with finding of a abnormal ECG.  I do have records from Dr. Ouida Sills for review. She has had episodic chest pain, with a particularly bad spell several weeks ago, during which time she did not seek medical attention. She has been treated for possible GERD with PPI. Chest pain has not been reproducible with exertion, but she does report dyspnea on exertion that is mild to moderate. It sounds like she had dyspnea at rest and orthopnea with the more intense chest pain episode originally.  Record review finds a cardiac catheterization from 2002 showing essentially normal coronary arteries and normal LVEF. She has had no followup ischemic testing.  Her history includes cancer, as outlined below, requiring treatment including surgery, chemotherapy, and XRT.  History of PSVT is more vague, and seemingly well controlled on Atenolol. She reports no palpitations or syncope.   Allergies  Allergen Reactions  . Codeine   . Demerol   . Morphine And Related Nausea And Vomiting  . Penicillins Hives    Medication list reviewed.  Past Medical History  Diagnosis Date  . Essential hypertension, benign   . 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 Crohn's disease   . Spinal stenosis   . PSVT (paroxysmal supraventricular tachycardia)   . History of TIAs   . GERD (gastroesophageal reflux disease)   . 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 Carotid artery disease     RICA 50-69% 12/09    Past Surgical History  Procedure Date  . Left thoracotomy with wedge resection 2008    Left lower lobe  . Right tonsillectomy 2005  .  Left thyroidectomy 2006  . Left breast biopsy 2011    Family History  Problem Relation Age of Onset  . Coronary artery disease Father     Died age 39  . Dementia Mother     Social History [name] reports that she quit smoking about 17 years ago. Her smoking use included Cigarettes. She has never used smokeless tobacco. [name] reports that she does not drink alcohol.  Review of Systems As outlined above. No reported cough, fevers, chills. Stable appetite. Intermittent constipation and feeling of abdominal bloating. Otherwise negative.  Physical Examination Filed Vitals:   06/21/11 1444  BP: 124/75  Pulse: 73  Resp: 18  Overweight woman in no acute distress. HEENT: Conjuctivae and lids normal, oropharynx clear. Neck: Supple, no JVD or obvious bruit. Lungs: Clear, decreased breath sounds at bases. Cardiac: Regular rate and rhythm, no S3. Abdomen: Soft, NABS. Skin: Warm and dry. Extremities: Venous stasis. Musculoskeletal: Mild kyphosis. Neuropsychiatric: Alert and oriented x 3, pleasant, somewhat tangential historian.   ECG Normal sinus rhythm at 72 beats per minute with inferior T-wave inversions, nonspecific ST-T wave changes in the anterolateral leads. Poor anterior R wave progression. Previous tracing from September 11 show similar T-wave inversions in the inferior leads. Tracing from July of this year showed normal inferior T waves.   Problem List and Plan

## 2011-06-21 NOTE — Assessment & Plan Note (Signed)
Carotid Doppler scheduled to reassess degree of disease.

## 2011-06-21 NOTE — Assessment & Plan Note (Signed)
Angina is of concern, although does have history of GERD.

## 2011-06-21 NOTE — Assessment & Plan Note (Signed)
More evaluation planned to better assess perioperative cardiac risk. Although cardiac catheterization from 2002 was reassuring, she presents with symptoms of possible angina, abnormal ECG, evidence of vascular disease in carotid distribution, hypertension, and history of XRT in upper chest/neck region. 2D echocardiogram as well as Lexiscan Myoview are scheduled with subsequent office followup.

## 2011-06-21 NOTE — Patient Instructions (Signed)
Your physician recommends that you continue on your current medications as directed. Please refer to the Current Medication list given to you today.  Your physician has requested that you have a lexiscan myoview. For further information please visit [email or web address] Please follow instruction sheet, as given.  Your physician has requested that you have a carotid duplex. This test is an ultrasound of the carotid arteries in your neck. It looks at blood flow through these arteries that supply the brain with blood. Allow one hour for this exam. There are no restrictions or special instructions.  Your physician has requested that you have an echocardiogram. Echocardiography is a painless test that uses sound waves to create images of your heart. It provides your doctor with information about the size and shape of your heart and how well your heart's chambers and valves are working. This procedure takes approximately one hour. There are no restrictions for this procedure.  Your physician recommends that you schedule a follow-up appointment in: 2 to 3 weeks

## 2011-06-21 NOTE — Assessment & Plan Note (Signed)
Blood pressure is reasonable today. 

## 2011-06-21 NOTE — Assessment & Plan Note (Signed)
Concerned that this could represent evidence of subendocardial inferior infarct, perhaps even around the time of her more intense chest pain symptoms several weeks ago. Her ECG changed from tracing in July to the followup more recently.

## 2011-06-22 LAB — DIFFERENTIAL
Basophils Relative: 0
Eosinophils Absolute: 0.1
Monocytes Relative: 8
Neutrophils Relative %: 71

## 2011-06-22 LAB — CBC
Hemoglobin: 14
RBC: 4.55
RDW: 14.3
WBC: 5.3

## 2011-06-22 LAB — URINALYSIS, ROUTINE W REFLEX MICROSCOPIC
Bilirubin Urine: NEGATIVE
Protein, ur: NEGATIVE
Urobilinogen, UA: 0.2

## 2011-06-22 LAB — COMPREHENSIVE METABOLIC PANEL
ALT: 12
Alkaline Phosphatase: 56
CO2: 27
Chloride: 99
Glucose, Bld: 106 — ABNORMAL HIGH
Potassium: 4
Sodium: 135
Total Protein: 6.3

## 2011-06-22 LAB — URINE CULTURE: Colony Count: >=100000

## 2011-06-25 ENCOUNTER — Ambulatory Visit (HOSPITAL_COMMUNITY)
Admission: RE | Admit: 2011-06-25 | Discharge: 2011-06-25 | Disposition: A | Discharge status: Home / Self Care | Payer: Medicare Other | Source: Ambulatory Visit | Attending: Cardiology | Admitting: Cardiology

## 2011-06-25 DIAGNOSIS — R9431 Abnormal electrocardiogram [ECG] [EKG]: Secondary | ICD-10-CM

## 2011-06-25 DIAGNOSIS — R55 Syncope and collapse: Secondary | ICD-10-CM | POA: Insufficient documentation

## 2011-06-25 DIAGNOSIS — I10 Essential (primary) hypertension: Secondary | ICD-10-CM | POA: Insufficient documentation

## 2011-06-25 DIAGNOSIS — I6529 Occlusion and stenosis of unspecified carotid artery: Secondary | ICD-10-CM | POA: Insufficient documentation

## 2011-06-25 DIAGNOSIS — R079 Chest pain, unspecified: Secondary | ICD-10-CM

## 2011-06-25 DIAGNOSIS — I319 Disease of pericardium, unspecified: Secondary | ICD-10-CM

## 2011-06-25 DIAGNOSIS — R072 Precordial pain: Secondary | ICD-10-CM | POA: Insufficient documentation

## 2011-06-25 NOTE — Progress Notes (Signed)
*  PRELIMINARY RESULTS* Echocardiogram 2D Echocardiogram has been performed.  Conrad [place]air 06/25/2011, 10:23 AM

## 2011-06-26 ENCOUNTER — Encounter (HOSPITAL_COMMUNITY): Payer: Medicare Other | Attending: Oncology

## 2011-06-26 DIAGNOSIS — C760 Malignant neoplasm of head, face and neck: Secondary | ICD-10-CM

## 2011-06-26 DIAGNOSIS — Z452 Encounter for adjustment and management of vascular access device: Secondary | ICD-10-CM

## 2011-06-26 MED ORDER — HEPARIN SOD (PORK) LOCK FLUSH 100 UNIT/ML IV SOLN
INTRAVENOUS | Status: AC
  Filled 2011-06-26: qty 5

## 2011-06-26 MED ORDER — SODIUM CHLORIDE 0.9 % IJ SOLN
INTRAMUSCULAR | Status: AC
  Filled 2011-06-26: qty 10

## 2011-06-26 NOTE — Progress Notes (Signed)
Ovie K Rufus presented for Portacath access and flush. Proper placement of portacath confirmed by CXR. Portacath located right chest wall accessed with  H 20 needle. Good blood return present. Portacath flushed with 20ml NS and 500U/5ml Heparin and needle removed intact. Procedure without incident. Patient tolerated procedure well.   

## 2011-06-28 ENCOUNTER — Other Ambulatory Visit: Payer: Self-pay | Admitting: Cardiology

## 2011-06-28 DIAGNOSIS — R9431 Abnormal electrocardiogram [ECG] [EKG]: Secondary | ICD-10-CM

## 2011-06-29 LAB — DIFFERENTIAL
Basophils Absolute: 0
Basophils Relative: 0
Eosinophils Relative: 2
Lymphocytes Relative: 16
Monocytes Absolute: 0.5
Monocytes Relative: 10

## 2011-06-29 LAB — COMPREHENSIVE METABOLIC PANEL
AST: 15
Albumin: 3.5
Alkaline Phosphatase: 60
Chloride: 101
GFR calc Af Amer: >60
Potassium: 4
Total Bilirubin: 0.6

## 2011-06-29 LAB — CBC
Platelets: 263
WBC: 5.5

## 2011-07-02 LAB — DIFFERENTIAL
Lymphocytes Relative: 17
Lymphs Abs: 1.1
Monocytes Absolute: 0.4
Monocytes Relative: 6
Neutro Abs: 4.9
Neutrophils Relative %: 75

## 2011-07-02 LAB — CBC
HCT: 41.9
MCHC: 34.2
Platelets: 274
RDW: 14.5

## 2011-07-02 LAB — COMPREHENSIVE METABOLIC PANEL
Albumin: 3.5
Alkaline Phosphatase: 62
BUN: 8
Calcium: 8.9
Potassium: 3.6
Total Protein: 6.3

## 2011-07-03 ENCOUNTER — Encounter (HOSPITAL_COMMUNITY)
Admission: RE | Admit: 2011-07-03 | Discharge: 2011-07-03 | Disposition: A | Discharge status: Home / Self Care | Payer: Medicare Other | Source: Ambulatory Visit | Attending: Cardiology | Admitting: Cardiology

## 2011-07-03 ENCOUNTER — Ambulatory Visit (INDEPENDENT_AMBULATORY_CARE_PROVIDER_SITE_OTHER): Payer: Medicare Other | Admitting: *Deleted

## 2011-07-03 ENCOUNTER — Encounter: Payer: Self-pay | Admitting: Cardiology

## 2011-07-03 ENCOUNTER — Encounter (HOSPITAL_COMMUNITY): Payer: Self-pay

## 2011-07-03 DIAGNOSIS — R079 Chest pain, unspecified: Secondary | ICD-10-CM

## 2011-07-03 DIAGNOSIS — Z01810 Encounter for preprocedural cardiovascular examination: Secondary | ICD-10-CM | POA: Insufficient documentation

## 2011-07-03 DIAGNOSIS — R0789 Other chest pain: Secondary | ICD-10-CM | POA: Insufficient documentation

## 2011-07-03 DIAGNOSIS — R9431 Abnormal electrocardiogram [ECG] [EKG]: Secondary | ICD-10-CM

## 2011-07-03 DIAGNOSIS — Z01818 Encounter for other preprocedural examination: Secondary | ICD-10-CM

## 2011-07-03 MED ORDER — TECHNETIUM TC 99M TETROFOSMIN IV KIT
30.0000 | PACK | Freq: Once | INTRAVENOUS | Status: AC | PRN
  Administered 2011-07-03: 29 via INTRAVENOUS

## 2011-07-03 MED ORDER — TECHNETIUM TC 99M TETROFOSMIN IV KIT
10.0000 | PACK | Freq: Once | INTRAVENOUS | Status: AC | PRN
  Administered 2011-07-03: 10 via INTRAVENOUS

## 2011-07-03 NOTE — Progress Notes (Signed)
Stress Lab Nurses Notes - [name]  [name] 07/03/2011  Reason for doing test: Chest Pain and Surgical Clearance  Type of test: [name]  Nurse performing test: Parke Poisson, RN  Nuclear Medicine Tech: Lou Cal  Echo Tech: Not Applicable  MD performing test: P. Nishan & Joni Reining NP  Family MD: Ouida Sills  Test explained and consent signed: yes  IV started: 22g jelco, Saline lock flushed, No redness or edema and Saline lock started in radiology  Symptoms: SOB  Treatment/Intervention: None  Reason test stopped: protocol completed  After recovery IV was: Discontinued via X-ray tech and No redness or edema  Patient to return to Nuc. Med at : 11:15  Patient discharged: Home  Patient's Condition upon discharge was: stable  Comments: Having some dizziness prior to test,  juice and cracker given, BP stable 144/74.  During test BP 138/62 & HR 79.  Recovery BP 142/62 & HR 75.  Erskine Speed T

## 2011-07-09 LAB — COMPREHENSIVE METABOLIC PANEL
ALT: 14
AST: 15
Albumin: 3.4 — ABNORMAL LOW
CO2: 28
Calcium: 8.8
Chloride: 102
GFR calc Af Amer: >60
GFR calc non Af Amer: >60
Sodium: 135

## 2011-07-09 LAB — DIFFERENTIAL
Eosinophils Absolute: 0.1 — ABNORMAL LOW
Eosinophils Relative: 2
Lymphocytes Relative: 22
Lymphs Abs: 1.3
Monocytes Absolute: 0.5

## 2011-07-09 LAB — CBC
MCHC: 33.3
Platelets: 320
RBC: 4.5
WBC: 5.7

## 2011-07-12 LAB — BASIC METABOLIC PANEL
CO2: 27
Chloride: 100
GFR calc Af Amer: >60
Potassium: 3.7

## 2011-07-12 LAB — CBC
HCT: 35.6 — ABNORMAL LOW
HCT: 36.5
Hemoglobin: 12
Hemoglobin: 14.2
MCHC: 33.7
MCHC: 33.7
MCHC: 33.9
MCV: 92.6
MCV: 92.9
RBC: 3.93
RBC: 4.53
RDW: 14.6 — ABNORMAL HIGH
WBC: 5.9
WBC: 9.8

## 2011-07-12 LAB — COMPREHENSIVE METABOLIC PANEL
ALT: 12
AST: 16
Alkaline Phosphatase: 50
Alkaline Phosphatase: 57
BUN: 6
CO2: 23
Calcium: 9.4
Creatinine, Ser: 0.63
GFR calc Af Amer: >60
GFR calc non Af Amer: >60
Glucose, Bld: 121 — ABNORMAL HIGH
Glucose, Bld: 129 — ABNORMAL HIGH
Potassium: 3.9
Potassium: 3.9
Sodium: 136
Total Protein: 5.4 — ABNORMAL LOW

## 2011-07-12 LAB — BLOOD GAS, ARTERIAL
Bicarbonate: 23.2
Drawn by: 274481
FIO2: 0.21
pCO2 arterial: 38.5
pH, Arterial: 7.397
pO2, Arterial: 80.3

## 2011-07-12 LAB — URINALYSIS, ROUTINE W REFLEX MICROSCOPIC
Glucose, UA: NEGATIVE
Hgb urine dipstick: NEGATIVE
Ketones, ur: NEGATIVE
pH: 6.5

## 2011-07-12 LAB — TYPE AND SCREEN: ABO/RH(D): A POS

## 2011-07-12 LAB — POCT I-STAT 3, ART BLOOD GAS (G3+)
Acid-Base Excess: 1
Bicarbonate: 25.5 — ABNORMAL HIGH
O2 Saturation: 92
Patient temperature: 97.5
TCO2: 27
pO2, Arterial: 61 — ABNORMAL LOW

## 2011-07-12 LAB — PROTIME-INR: Prothrombin Time: 11.7

## 2011-07-12 LAB — ABO/RH: ABO/RH(D): A POS

## 2011-07-13 ENCOUNTER — Encounter: Payer: Self-pay | Admitting: Cardiology

## 2011-07-13 ENCOUNTER — Ambulatory Visit (INDEPENDENT_AMBULATORY_CARE_PROVIDER_SITE_OTHER): Payer: Medicare Other | Admitting: Cardiology

## 2011-07-13 DIAGNOSIS — I779 Disorder of arteries and arterioles, unspecified: Secondary | ICD-10-CM

## 2011-07-13 DIAGNOSIS — I2510 Atherosclerotic heart disease of native coronary artery without angina pectoris: Secondary | ICD-10-CM

## 2011-07-13 DIAGNOSIS — I10 Essential (primary) hypertension: Secondary | ICD-10-CM

## 2011-07-13 DIAGNOSIS — Z01818 Encounter for other preprocedural examination: Secondary | ICD-10-CM

## 2011-07-13 MED ORDER — ISOSORBIDE MONONITRATE ER 30 MG PO TB24: 30.0000 mg | ORAL_TABLET | Freq: Every day | ORAL | Status: DC

## 2011-07-13 MED ORDER — ASPIRIN EC 81 MG PO TBEC: 81.0000 mg | DELAYED_RELEASE_TABLET | Freq: Every day | ORAL | Status: DC

## 2011-07-13 NOTE — Assessment & Plan Note (Signed)
Continue current regimen for now. Depending on adequate blood pressure control overall, could consider an ACE inhibitor next.

## 2011-07-13 NOTE — Patient Instructions (Signed)
Your physician has recommended you make the following change in your medication: Start taking Imdur 30 mg in the evening and Aspirin 81 mg daily  Your physician recommends that you schedule a follow-up appointment in: 2 months

## 2011-07-13 NOTE — Progress Notes (Signed)
Clinical Summary [name] is an 77 y.o.female presenting for followup. She was seen recently for preoperative evaluation prior to ENT surgery to address a right thyroid lobe mass, initially canceled by anesthesia related to an abnormal ECG. Operation is to be done by Dr. Suzanna Obey.  Patient's history and cardiac risk factors were reviewed at the last visit as well as symptoms that were concerning for angina and possible cardiac event within the last few months. Followup testing was arranged, outlined below, including stress testing, echocardiogram, and carotid Dopplers.   [name] has evidence of underlying CAD with inferior ischemia, low-normal LVEF, and overall moderate carotid artery disease, most significant on the right. I reviewed this with her in detail today and discussed the implications.  She does report exertional shortness of breath and angina, overall relatively mild 2 moderate symptoms at this point. She has had no rest chest pain or definite congestive heart failure. She continues on aspirin intermittently, and beta blocker therapy which she has tolerated for years.    Allergies  Allergen Reactions  . Codeine   . Demerol   . Morphine And Related Nausea And Vomiting  . Penicillins Hives    Medication list reviewed.  Past Medical History  Diagnosis Date  . Essential hypertension, benign   . 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 Crohn's disease   . Spinal stenosis   . PSVT (paroxysmal supraventricular tachycardia)   . History of TIAs   . GERD (gastroesophageal reflux disease)   . 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 Carotid artery disease     RICA 50-69% 12/09    Past Surgical History  Procedure Date  . Left thoracotomy with wedge resection 2008    Left lower lobe  . Right tonsillectomy 2005  . Left thyroidectomy 2006  . Left breast biopsy 2011    Family History    Problem Relation Age of Onset  . Coronary artery disease Father     Died age 50  . Dementia Mother     Social History [name] reports that she quit smoking about 17 years ago. Her smoking use included Cigarettes. She has never used smokeless tobacco. [name] reports that she does not drink alcohol.  Review of Systems As outlined above. No palpitations or syncope. Occasional "sore throat." No focal motor weakness or speech deficits. No reported bleeding problems. Otherwise negative.  Physical Examination Filed Vitals:   07/13/11 0834  BP: 142/85  Pulse: 81    Overweight woman in no acute distress.  HEENT: Conjuctivae and lids normal, oropharynx clear.  Neck: Supple, no JVD or obvious bruit.  Lungs: Clear, decreased breath sounds at bases.  Cardiac: Regular rate and rhythm, no S3.  Abdomen: Soft, NABS.  Skin: Warm and dry.  Extremities: Venous stasis.  Musculoskeletal: Mild kyphosis.  Neuropsychiatric: Alert and oriented x 3, pleasant, somewhat tangential historian.   Studies Lexiscan Myoview 07/06/2011: IMPRESSION: Abnormal pharmacologic stress nuclear myocardial study revealing no stress induced EKG abnormalities, normal left ventricular size and an inferior segmental wall motion abnormality with mildly impaired overall LV systolic function. By scintigraphic imaging, there was inferior infarction with significant peri-infarction ischemia. Other findings as noted. LVEF 49%.  Carotid Dopplers 06/25/2011: 50-69% RICA stenosis and moderate LICA atherosclerosis with some possible ulceration in carotid bulb although no stenosis.  Echocardiogram 06/25/2011: - Left ventricle: The cavity size was normal. There was mild  to moderate concentric hypertrophy. Systolic function was lownormal. The estimated ejection fraction was in the range of 50% to 55%. Wall motion was normal; there were no regional wall motion abnormalities. - Aortic valve: Mildly calcified annulus. Mildly  thickened, mildly calcified leaflets. - Right ventricle: The cavity size was normal. Wall thickness was mildly increased. - Atrial septum: No defect or patent foramen ovale was identified. - Pericardium, extracardiac: A very small and predominently posterior pericardial effusion was identified.     Problem List and Plan

## 2011-07-13 NOTE — Assessment & Plan Note (Signed)
Patient is at least at moderate perioperative risk for cardiac complication with plans for ENT surgery under general anesthesia, and evidence of underlying CAD with inferior ischemia, LVEF 50%. As noted above, recommendation is to proceed with surgery on medical therapy, consider inpatient cardiology consultation if needed. Pursuing a cardiac catheterization with possible revascularization at this time, would not lead to reduction in perioperative MI or death, and likely only serve to postpone surgery if dual antiplatelet therapy is used. This is based on guideline recommendations. Situation discussed with the patient in detail, and she voiced understanding. Plan to forward recommendations to Dr. Jearld Fenton.

## 2011-07-13 NOTE — Assessment & Plan Note (Signed)
Myoview is consistent with CAD, probable component of inferior wall scar and peri-infarct ischemia. She has low normal LVEF around 50%, mild to moderate angina and shortness of breath symptoms that have been stable, possibly even had a subendocardial event back in the summer months at least based on her symptoms. Pursuing cardiac catheterization and revascularization at this time, while possibly improving symptoms, would not reduce her risk of myocardial infarction or death with moderate risk ENT surgery. In fact, it may only serve to delay surgery further, particularly if she were to require long-term dual antiplatelet therapy with an intervention. For now our plan will be to continue aspirin daily, atenolol, and add Imdur if she tolerates it. We can consider whether revascularization strategies are necessary depending on how she does clinically, after her thyroid mass is dealt with.

## 2011-07-13 NOTE — Assessment & Plan Note (Signed)
Moderate disease as noted above. No definite symptoms. Continue aspirin and followup imaging. Should not preclude thyroid surgery.

## 2011-07-16 LAB — COMPREHENSIVE METABOLIC PANEL
ALT: 12
AST: 15
AST: 17
Albumin: 3.3 — ABNORMAL LOW
CO2: 29
Calcium: 8.7
Chloride: 104
Creatinine, Ser: 0.73
Creatinine, Ser: 0.84
GFR calc Af Amer: >60
GFR calc Af Amer: >60
GFR calc non Af Amer: >60
Sodium: 136
Total Bilirubin: 0.7

## 2011-07-16 LAB — CBC
Hemoglobin: 13.7
MCV: 90.9
RBC: 4.45
WBC: 4.9

## 2011-07-16 LAB — DIFFERENTIAL
Basophils Absolute: 0
Eosinophils Absolute: 0.1
Eosinophils Relative: 3

## 2011-07-19 LAB — COMPREHENSIVE METABOLIC PANEL
Alkaline Phosphatase: 54
BUN: 7
Calcium: 9
GFR calc non Af Amer: >60
Glucose, Bld: 108 — ABNORMAL HIGH
Potassium: 3.6
Total Protein: 5.7 — ABNORMAL LOW

## 2011-07-19 LAB — CBC
HCT: 38.4
Hemoglobin: 13.4
MCHC: 35
RDW: 14.6 — ABNORMAL HIGH

## 2011-07-24 ENCOUNTER — Telehealth: Payer: Self-pay | Admitting: Cardiology

## 2011-07-24 NOTE — Telephone Encounter (Signed)
Received call from [facility] at Dr. Jearld Fenton office.  They have received medical office notes on patient.  They need to know if patient can stop aspirin one week prior to  Thyroidectomy.  Please fax this to [phone].

## 2011-07-24 NOTE — Telephone Encounter (Signed)
My preference would be for her to continue aspirin 81 mg daily throughout, however if it is felt that her bleeding risk would be too high with thyroid surgery, then would agree to temporary discontinuation of aspirin for a week, and then resumption after surgery.

## 2011-07-24 NOTE — Telephone Encounter (Signed)
Pt. was seen by Dr. Diona Browner on 10-12 and was given cardiac clearance to have Thyroidectomy. Dr. Jearld Fenton' office needs to know if pt. can hold asa for 1 week prior to procedure. Please advise./LV

## 2011-07-26 ENCOUNTER — Other Ambulatory Visit (HOSPITAL_COMMUNITY): Payer: Self-pay | Admitting: Oncology

## 2011-07-26 ENCOUNTER — Encounter (HOSPITAL_COMMUNITY): Payer: Medicare Other | Attending: Oncology

## 2011-07-26 DIAGNOSIS — C50919 Malignant neoplasm of unspecified site of unspecified female breast: Secondary | ICD-10-CM | POA: Insufficient documentation

## 2011-07-26 DIAGNOSIS — Z0000 Encounter for general adult medical examination without abnormal findings: Secondary | ICD-10-CM

## 2011-07-26 DIAGNOSIS — C779 Secondary and unspecified malignant neoplasm of lymph node, unspecified: Secondary | ICD-10-CM | POA: Insufficient documentation

## 2011-07-26 DIAGNOSIS — C7989 Secondary malignant neoplasm of other specified sites: Secondary | ICD-10-CM

## 2011-07-26 DIAGNOSIS — Z23 Encounter for immunization: Secondary | ICD-10-CM

## 2011-07-26 MED ORDER — SODIUM CHLORIDE 0.9 % IJ SOLN
10.0000 mL | Freq: Once | INTRAMUSCULAR | Status: AC
  Administered 2011-07-26: 10 mL via INTRAVENOUS
  Filled 2011-07-26: qty 10

## 2011-07-26 MED ORDER — INFLUENZA VIRUS VACC SPLIT PF IM SUSP
INTRAMUSCULAR | Status: AC
  Administered 2011-07-26: 0.5 mL via INTRAMUSCULAR
  Filled 2011-07-26: qty 0.5

## 2011-07-26 MED ORDER — INFLUENZA VIRUS VACC SPLIT PF IM SUSP
0.5000 mL | Freq: Once | INTRAMUSCULAR | Status: AC
  Administered 2011-07-26: 0.5 mL via INTRAMUSCULAR

## 2011-07-26 MED ORDER — HEPARIN SOD (PORK) LOCK FLUSH 100 UNIT/ML IV SOLN
500.0000 [IU] | Freq: Once | INTRAVENOUS | Status: AC
  Administered 2011-07-26: 500 [IU] via INTRAVENOUS
  Filled 2011-07-26: qty 5

## 2011-07-26 MED ORDER — INFLUENZA VIRUS VACC SPLIT PF IM SUSP: 0.5000 mL | Freq: Once | INTRAMUSCULAR | Status: DC

## 2011-07-26 MED ORDER — HEPARIN SOD (PORK) LOCK FLUSH 100 UNIT/ML IV SOLN
INTRAVENOUS | Status: AC
  Administered 2011-07-26: 500 [IU] via INTRAVENOUS
  Filled 2011-07-26: qty 5

## 2011-07-26 NOTE — Progress Notes (Signed)
[name] presents today for injection per MD orders. flurivax 0.5 cc  administered im in left Upper Arm. Administration without incident. Patient tolerated well. [name] presented for Portacath access and flush. Proper placement of portacath confirmed by CXR. Portacath located rt chest wall accessed with  H 20 needle. Good blood return present. Portacath flushed with 20ml NS and 500U/17ml Heparin and needle removed intact. Procedure without incident. Patient tolerated procedure well.

## 2011-07-30 ENCOUNTER — Telehealth: Payer: Self-pay | Admitting: Cardiology

## 2011-07-30 NOTE — Telephone Encounter (Signed)
PATIENT IS WAITING TO HEAR BACK ABOUT SURGICAL CLEARANCE, LOOKS LIKE [facility] HAD RESPONDED. DID WE SEND NOTE OR CALL?

## 2011-08-17 ENCOUNTER — Encounter (HOSPITAL_COMMUNITY): Payer: Self-pay

## 2011-08-29 ENCOUNTER — Encounter (HOSPITAL_COMMUNITY): Payer: Self-pay | Admitting: *Deleted

## 2011-08-30 ENCOUNTER — Ambulatory Visit (HOSPITAL_COMMUNITY): Payer: Medicare Other | Admitting: Anesthesiology

## 2011-08-30 ENCOUNTER — Other Ambulatory Visit: Payer: Self-pay | Admitting: Otolaryngology

## 2011-08-30 ENCOUNTER — Encounter (HOSPITAL_COMMUNITY): Payer: Self-pay | Admitting: *Deleted

## 2011-08-30 ENCOUNTER — Inpatient Hospital Stay (HOSPITAL_COMMUNITY)
Admission: RE | Admit: 2011-08-30 | Discharge: 2011-09-03 | DRG: 627 | Disposition: A | Discharge status: Home / Self Care | Payer: Medicare Other | Source: Ambulatory Visit | Attending: Cardiovascular Disease | Admitting: Cardiovascular Disease

## 2011-08-30 ENCOUNTER — Encounter (HOSPITAL_COMMUNITY): Payer: Self-pay | Admitting: Anesthesiology

## 2011-08-30 ENCOUNTER — Encounter (HOSPITAL_COMMUNITY)
Admission: RE | Disposition: A | Discharge status: Home / Self Care | Payer: Self-pay | Source: Ambulatory Visit | Attending: Otolaryngology

## 2011-08-30 DIAGNOSIS — Z9889 Other specified postprocedural states: Secondary | ICD-10-CM

## 2011-08-30 DIAGNOSIS — I10 Essential (primary) hypertension: Secondary | ICD-10-CM | POA: Insufficient documentation

## 2011-08-30 DIAGNOSIS — K219 Gastro-esophageal reflux disease without esophagitis: Secondary | ICD-10-CM | POA: Diagnosis present

## 2011-08-30 DIAGNOSIS — Z01818 Encounter for other preprocedural examination: Secondary | ICD-10-CM

## 2011-08-30 DIAGNOSIS — J4489 Other specified chronic obstructive pulmonary disease: Secondary | ICD-10-CM | POA: Diagnosis present

## 2011-08-30 DIAGNOSIS — Z85118 Personal history of other malignant neoplasm of bronchus and lung: Secondary | ICD-10-CM

## 2011-08-30 DIAGNOSIS — J449 Chronic obstructive pulmonary disease, unspecified: Secondary | ICD-10-CM

## 2011-08-30 DIAGNOSIS — I4891 Unspecified atrial fibrillation: Secondary | ICD-10-CM | POA: Diagnosis not present

## 2011-08-30 DIAGNOSIS — Z8673 Personal history of transient ischemic attack (TIA), and cerebral infarction without residual deficits: Secondary | ICD-10-CM

## 2011-08-30 DIAGNOSIS — Z85819 Personal history of malignant neoplasm of unspecified site of lip, oral cavity, and pharynx: Secondary | ICD-10-CM

## 2011-08-30 DIAGNOSIS — I252 Old myocardial infarction: Secondary | ICD-10-CM

## 2011-08-30 DIAGNOSIS — Z8249 Family history of ischemic heart disease and other diseases of the circulatory system: Secondary | ICD-10-CM

## 2011-08-30 DIAGNOSIS — I2510 Atherosclerotic heart disease of native coronary artery without angina pectoris: Secondary | ICD-10-CM | POA: Insufficient documentation

## 2011-08-30 DIAGNOSIS — K5900 Constipation, unspecified: Secondary | ICD-10-CM | POA: Diagnosis not present

## 2011-08-30 DIAGNOSIS — E079 Disorder of thyroid, unspecified: Principal | ICD-10-CM | POA: Diagnosis present

## 2011-08-30 DIAGNOSIS — Z7982 Long term (current) use of aspirin: Secondary | ICD-10-CM

## 2011-08-30 HISTORY — DX: Acute myocardial infarction, unspecified: I21.9

## 2011-08-30 HISTORY — DX: Disorder of thyroid, unspecified: E07.9

## 2011-08-30 HISTORY — DX: Anxiety disorder, unspecified: F41.9

## 2011-08-30 HISTORY — PX: THYROIDECTOMY: SHX17

## 2011-08-30 HISTORY — DX: Unspecified urinary incontinence: R32

## 2011-08-30 HISTORY — DX: Unspecified osteoarthritis, unspecified site: M19.90

## 2011-08-30 HISTORY — DX: Chronic obstructive pulmonary disease, unspecified: J44.9

## 2011-08-30 LAB — CALCIUM: Calcium: 8.5 mg/dL (ref 8.4–10.5)

## 2011-08-30 LAB — BASIC METABOLIC PANEL
GFR calc Af Amer: >90 mL/min (ref >90)
GFR calc non Af Amer: 81 mL/min — ABNORMAL LOW (ref >90)
Glucose, Bld: 102 mg/dL — ABNORMAL HIGH (ref 70–99)
Potassium: 4.5 mEq/L (ref 3.5–5.1)
Sodium: 135 mEq/L (ref 135–145)

## 2011-08-30 LAB — CBC
Hemoglobin: 13.8 g/dL (ref 12.0–15.0)
RBC: 4.78 MIL/uL (ref 3.87–5.11)
WBC: 6.1 10*3/uL (ref 4.0–10.5)

## 2011-08-30 LAB — SURGICAL PCR SCREEN: MRSA, PCR: NEGATIVE

## 2011-08-30 SURGERY — THYROIDECTOMY
Anesthesia: General | Laterality: Right | Wound class: Clean

## 2011-08-30 MED ORDER — PROPOFOL 10 MG/ML IV EMUL
INTRAVENOUS | Status: DC | PRN
  Administered 2011-08-30: 50 mg via INTRAVENOUS
  Administered 2011-08-30: 150 mg via INTRAVENOUS
  Administered 2011-08-30 (×3): 20 mg via INTRAVENOUS
  Administered 2011-08-30: 30 mg via INTRAVENOUS

## 2011-08-30 MED ORDER — KETOROLAC TROMETHAMINE 30 MG/ML IJ SOLN
15.0000 mg | Freq: Once | INTRAMUSCULAR | Status: AC | PRN
  Administered 2011-08-30: 30 mg via INTRAVENOUS

## 2011-08-30 MED ORDER — HYDROMORPHONE HCL 2 MG PO TABS: 2.0000 mg | ORAL_TABLET | ORAL | Status: AC | PRN

## 2011-08-30 MED ORDER — PANTOPRAZOLE SODIUM 40 MG PO TBEC
40.0000 mg | DELAYED_RELEASE_TABLET | Freq: Every day | ORAL | Status: DC
  Administered 2011-08-30 – 2011-09-03 (×5): 40 mg via ORAL
  Filled 2011-08-30 (×5): qty 1

## 2011-08-30 MED ORDER — EPHEDRINE SULFATE 50 MG/ML IJ SOLN
INTRAMUSCULAR | Status: DC | PRN
  Administered 2011-08-30: 5 mg via INTRAVENOUS
  Administered 2011-08-30 (×2): 10 mg via INTRAVENOUS

## 2011-08-30 MED ORDER — MUPIROCIN 2 % EX OINT
1.0000 "application " | TOPICAL_OINTMENT | Freq: Two times a day (BID) | CUTANEOUS | Status: DC
  Administered 2011-08-30: 1 via NASAL
  Filled 2011-08-30 (×2): qty 22

## 2011-08-30 MED ORDER — PROMETHAZINE HCL 25 MG/ML IJ SOLN: 6.2500 mg | INTRAMUSCULAR | Status: DC | PRN

## 2011-08-30 MED ORDER — ATENOLOL 25 MG PO TABS
25.0000 mg | ORAL_TABLET | Freq: Every day | ORAL | Status: DC
  Administered 2011-08-31 – 2011-09-01 (×2): 25 mg via ORAL
  Filled 2011-08-30 (×3): qty 1

## 2011-08-30 MED ORDER — LACTATED RINGERS IV SOLN
INTRAVENOUS | Status: DC
  Administered 2011-08-30 (×2): via INTRAVENOUS

## 2011-08-30 MED ORDER — ALPRAZOLAM 0.25 MG PO TABS
0.2500 mg | ORAL_TABLET | Freq: Every evening | ORAL | Status: DC | PRN
  Administered 2011-08-31 – 2011-09-02 (×3): 0.25 mg via ORAL
  Filled 2011-08-30 (×3): qty 1

## 2011-08-30 MED ORDER — FENTANYL CITRATE 0.05 MG/ML IJ SOLN
INTRAMUSCULAR | Status: DC | PRN
  Administered 2011-08-30: 100 ug via INTRAVENOUS
  Administered 2011-08-30: 50 ug via INTRAVENOUS

## 2011-08-30 MED ORDER — HYDROMORPHONE HCL PF 1 MG/ML IJ SOLN
0.2500 mg | INTRAMUSCULAR | Status: DC | PRN
  Administered 2011-08-30 (×2): 0.5 mg via INTRAVENOUS

## 2011-08-30 MED ORDER — ONDANSETRON HCL 4 MG PO TABS: 4.0000 mg | ORAL_TABLET | ORAL | Status: DC | PRN

## 2011-08-30 MED ORDER — SUCCINYLCHOLINE CHLORIDE 20 MG/ML IJ SOLN
INTRAMUSCULAR | Status: DC | PRN
  Administered 2011-08-30: 100 mg via INTRAVENOUS

## 2011-08-30 MED ORDER — HYDROMORPHONE HCL 2 MG PO TABS
2.0000 mg | ORAL_TABLET | ORAL | Status: DC | PRN
  Administered 2011-08-31: 2 mg via ORAL
  Filled 2011-08-30: qty 1

## 2011-08-30 MED ORDER — BACITRACIN ZINC 500 UNIT/GM EX OINT
1.0000 "application " | TOPICAL_OINTMENT | Freq: Three times a day (TID) | CUTANEOUS | Status: DC
  Administered 2011-08-30: 1 via TOPICAL
  Filled 2011-08-30: qty 15

## 2011-08-30 MED ORDER — DEXTROSE-NACL 5-0.45 % IV SOLN
INTRAVENOUS | Status: DC
  Administered 2011-08-30: via INTRAVENOUS
  Administered 2011-08-30: 125 mL/h via INTRAVENOUS
  Administered 2011-08-31: 1000 mL via INTRAVENOUS

## 2011-08-30 MED ORDER — ONDANSETRON HCL 4 MG/2ML IJ SOLN
4.0000 mg | INTRAMUSCULAR | Status: DC | PRN
  Administered 2011-08-30 – 2011-08-31 (×5): 4 mg via INTRAVENOUS
  Filled 2011-08-30 (×6): qty 2

## 2011-08-30 MED ORDER — SODIUM CHLORIDE 0.9 % IR SOLN
Status: DC | PRN
  Administered 2011-08-30: 1000 mL

## 2011-08-30 MED ORDER — ONDANSETRON HCL 4 MG/2ML IJ SOLN
INTRAMUSCULAR | Status: DC | PRN
  Administered 2011-08-30: 4 mg via INTRAVENOUS

## 2011-08-30 MED ORDER — MESALAMINE ER 250 MG PO CPCR
1000.0000 mg | ORAL_CAPSULE | Freq: Four times a day (QID) | ORAL | Status: DC
  Administered 2011-08-30 – 2011-09-03 (×13): 1000 mg via ORAL
  Filled 2011-08-30 (×22): qty 4

## 2011-08-30 MED ORDER — PHENYLEPHRINE HCL 10 MG/ML IJ SOLN
INTRAMUSCULAR | Status: DC | PRN
  Administered 2011-08-30 (×4): 80 ug via INTRAVENOUS

## 2011-08-30 SURGICAL SUPPLY — 50 items
APPLIER CLIP 9.375 SM OPEN (CLIP) ×4
ATTRACTOMAT 16X20 MAGNETIC DRP (DRAPES) IMPLANT
BLADE SURG 15 STRL LF DISP TIS (BLADE) ×1 IMPLANT
BLADE SURG 15 STRL SS (BLADE) ×1
BLADE SURG ROTATE 9660 (MISCELLANEOUS) IMPLANT
CANISTER SUCTION 2500CC (MISCELLANEOUS) ×2 IMPLANT
CLEANER TIP ELECTROSURG 2X2 (MISCELLANEOUS) ×2 IMPLANT
CLIP APPLIE 9.375 SM OPEN (CLIP) ×2 IMPLANT
CLOTH BEACON ORANGE TIMEOUT ST (SAFETY) ×2 IMPLANT
CONT SPEC 4OZ CLIKSEAL STRL BL (MISCELLANEOUS) ×2 IMPLANT
CORDS BIPOLAR (ELECTRODE) ×2 IMPLANT
COVER SURGICAL LIGHT HANDLE (MISCELLANEOUS) ×2 IMPLANT
CRADLE DONUT ADULT HEAD (MISCELLANEOUS) ×2 IMPLANT
DERMABOND ADVANCED (GAUZE/BANDAGES/DRESSINGS) ×1
DERMABOND ADVANCED .7 DNX12 (GAUZE/BANDAGES/DRESSINGS) ×1 IMPLANT
DRAIN CHANNEL 15F RND FF W/TCR (WOUND CARE) IMPLANT
DRAIN HEMOVAC 1/8 X 5 (WOUND CARE) IMPLANT
DRAIN JACKSON RD 7FR 3/32 (WOUND CARE) ×2 IMPLANT
ELECT COATED BLADE 2.86 ST (ELECTRODE) ×2 IMPLANT
ELECT REM PT RETURN 9FT ADLT (ELECTROSURGICAL) ×2
ELECTRODE REM PT RTRN 9FT ADLT (ELECTROSURGICAL) ×1 IMPLANT
EVACUATOR SILICONE 100CC (DRAIN) ×2 IMPLANT
GAUZE SPONGE 4X4 16PLY XRAY LF (GAUZE/BANDAGES/DRESSINGS) ×2 IMPLANT
GLOVE BIO SURGEON STRL SZ 6 (GLOVE) ×2 IMPLANT
GLOVE BIO SURGEON STRL SZ7.5 (GLOVE) ×4 IMPLANT
GLOVE ECLIPSE 7.5 STRL STRAW (GLOVE) ×4 IMPLANT
GOWN STRL NON-REIN LRG LVL3 (GOWN DISPOSABLE) ×4 IMPLANT
KIT BASIN OR (CUSTOM PROCEDURE TRAY) ×2 IMPLANT
KIT ROOM TURNOVER OR (KITS) ×2 IMPLANT
LOCATOR NERVE 3 VOLT (DISPOSABLE) ×2 IMPLANT
NS IRRIG 1000ML POUR BTL (IV SOLUTION) ×2 IMPLANT
PAD ARMBOARD 7.5X6 YLW CONV (MISCELLANEOUS) ×2 IMPLANT
PENCIL FOOT CONTROL (ELECTRODE) ×2 IMPLANT
SPONGE INTESTINAL PEANUT (DISPOSABLE) IMPLANT
STAPLER VISISTAT 35W (STAPLE) ×2 IMPLANT
SUT CHROMIC 4 0 PS 2 18 (SUTURE) ×4 IMPLANT
SUT ETHILON 3 0 PS 1 (SUTURE) ×2 IMPLANT
SUT SILK 2 0 (SUTURE) ×1
SUT SILK 2-0 18XBRD TIE 12 (SUTURE) ×1 IMPLANT
SUT SILK 4 0 (SUTURE) ×1
SUT SILK 4-0 18XBRD TIE 12 (SUTURE) ×1 IMPLANT
TOWEL OR 17X24 6PK STRL BLUE (TOWEL DISPOSABLE) ×2 IMPLANT
TOWEL OR 17X26 10 PK STRL BLUE (TOWEL DISPOSABLE) ×2 IMPLANT
TRAY ENT MC OR (CUSTOM PROCEDURE TRAY) ×2 IMPLANT
TRAY FOLEY CATH 14FRSI W/METER (CATHETERS) IMPLANT
TUBE ENDOTRAC EMG 7X10.2 (MISCELLANEOUS) IMPLANT
TUBE ENDOTRAC EMG 8X11.3 (MISCELLANEOUS) IMPLANT
TUBE ENDOTRACH  EMG 6MMTUBE EN (MISCELLANEOUS)
TUBE ENDOTRACH EMG 6MMTUBE EN (MISCELLANEOUS) IMPLANT
WATER STERILE IRR 1000ML POUR (IV SOLUTION) ×2 IMPLANT

## 2011-08-30 NOTE — Progress Notes (Signed)
Report given to rebecca rn as caregiver 

## 2011-08-30 NOTE — Op Note (Signed)
Preop/postop diagnosis: Right thyroid mass Procedure: Right thyroidectomy with Nims monitor Anesthesia Gen. Assistant Dr. Noralee Space Estimated blood loss less than 10 cc Indications she is here for right thyroid mass excision. She has a documented mass in the right thyroid that has some  necrotic area. She's had previous cancers in other locations and she is adamant about having this lesion removed. We discussed the procedure. We discussed risks, benefits, and options. All questions are answered and consent was obtained. Operation: Patient was taken aberrantly supine position after general endotracheal tube anesthesia was placed in the supine position prepped and draped in the usual sterile manner the lesion was made after calibrating the Nims monitor endotracheal tube. An incision was made in the same location as previous. The flap was elevated superiorly and inferiorly and the retractor was positioned. The midline was identified and divided. The isthmus of the thyroid was identified. Dissection was carried out to the right dissecting out the thyroid gland carefully layer by layer along its capsule. What appeared to be a parathyroid gland was identified inferiorly and carefully dissected off the gland and preserved. The upper pole was dissected clamping off the vessels layer by layer. The dissection is taken into the region of the recurrent nerve where it was identified. The nerve monitor was used to stimulate the nerve there was good stimulation and then the dissection was carried up above this nerve removing berry's ligament and dissecting the thyroid gland off the trachea. Sent for permanent section. Wound was irrigated copiously and a #7 JP drain was positioned to the strap muscles were closed with interrupted 3-0 chromic. The skin was closed interrupted 3-0 chromic and then a Dermabond to close the skin. The whole drain was suture with a 3-0 nylon. Patient is awake and brought to cover stable condition  counts correct. Please send a copy this dictation to Dr. Carylon Perches and Dr. Karle Plumber

## 2011-08-30 NOTE — Addendum Note (Signed)
Addendum  created 08/30/11 1607 by Shireen Quan   Modules edited:Charges VN

## 2011-08-30 NOTE — H&P (Signed)
[name] is an 77 y.o. female.   Chief Complaint: Right thyroid mass HPI: She is here for removal of right thyroid mass. She has had workup with this with a fine-needle aspiration which showed benign cells but she is emphatic about having it removed. She previously had the left thyroid gland removed years ago.  Past Medical History  Diagnosis Date  . Essential hypertension, benign   . 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 Crohn's disease   . Spinal stenosis   . PSVT (paroxysmal supraventricular tachycardia)   . History of TIAs   . GERD (gastroesophageal reflux disease)   . 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 Carotid artery disease     RICA 50-69% 12/09  . PONV (postoperative nausea and vomiting)     years ago had N/V, but not recently  . Myocardial infarction   . COPD (chronic obstructive pulmonary disease)   . Thyroid mass   . Incontinence of urine   . Anxiety   . Arthritis     Past Surgical History  Procedure Date  . Left thoracotomy with wedge resection 2008    Left lower lobe  . Right tonsillectomy 2005  . Left thyroidectomy 2006  . Left breast biopsy 2011  . US echocardiography   . Cholecystectomy   . Cardiovascular stress test   . Abdominal hysterectomy     Family History  Problem Relation Age of Onset  . Coronary artery disease Father     Died age 10  . Dementia Mother    Social History:  reports that she quit smoking about 17 years ago. Her smoking use included Cigarettes. She has never used smokeless tobacco. She reports that she does not drink alcohol or use illicit drugs.  Allergies:  Allergies  Allergen Reactions  . Codeine Nausea And Vomiting  . Demerol Other (See Comments)    Blood pressure  . Morphine And Related Nausea And Vomiting  . Penicillins Hives    Medications Prior to Admission  Medication Dose Route Frequency Provider Last Rate Last Dose   . lactated ringers infusion   Intravenous Continuous Josepha Pigg, MD 50 mL/hr at 08/30/11 [phone]    . mupirocin ointment (BACTROBAN) 2 % 1 application  1 application Nasal BID Leonette Most   1 application at 08/30/11 4782   Medications Prior to Admission  Medication Sig Dispense Refill  . ALPRAZolam (XANAX) 0.25 MG tablet Take 0.25 mg by mouth at bedtime as needed. For sleep.      Marland Kitchen aspirin EC 81 MG tablet Take 1 tablet (81 mg total) by mouth daily.  150 tablet  2  . atenolol (TENORMIN) 25 MG tablet Take 25 mg by mouth daily.        . cholecalciferol (VITAMIN D) 1000 UNITS tablet Take 1,000 Units by mouth daily.        Marland Kitchen esomeprazole (NEXIUM) 40 MG capsule Take 1 capsule (40 mg total) by mouth daily.  30 capsule  5  . mesalamine (PENTASA) 250 MG CR capsule Take 1,000 mg by mouth 4 (four) times daily.         Results for orders placed during the hospital encounter of 08/30/11 (from the past 48 hour(s))  BASIC METABOLIC PANEL     Status: Abnormal   Collection Time   08/30/11  6:58 AM      Component Value Range Comment  Sodium 135  135 - 145 (mEq/L)    Potassium 4.5  3.5 - 5.1 (mEq/L)    Chloride 94 (*) 96 - 112 (mEq/L)    CO2 32  19 - 32 (mEq/L)    Glucose, Bld 102 (*) 70 - 99 (mg/dL)    BUN 13  6 - 23 (mg/dL)    Creatinine, Ser 4.09  0.50 - 1.10 (mg/dL)    Calcium 9.1  8.4 - 10.5 (mg/dL)    GFR calc non Af Amer 81 (*) >90 (mL/min)    GFR calc Af Amer >90  >90 (mL/min)   CBC     Status: Abnormal   Collection Time   08/30/11  6:58 AM      Component Value Range Comment   WBC 6.1  4.0 - 10.5 (K/uL)    RBC 4.78  3.87 - 5.11 (MIL/uL)    Hemoglobin 13.8  12.0 - 15.0 (g/dL)    HCT 81.1  91.4 - 78.2 (%)    MCV 88.7  78.0 - 100.0 (fL)    MCH 28.9  26.0 - 34.0 (pg)    MCHC 32.5  30.0 - 36.0 (g/dL)    RDW 95.6 (*) 21.3 - 15.5 (%)    Platelets 290  150 - 400 (K/uL)    No results found.  Review of Systems  Constitutional: Negative.   HENT: Negative.   Eyes: Negative.     Respiratory: Negative.   Cardiovascular: Negative.   Skin: Negative.     Blood pressure 122/85, pulse 61, temperature 98.1 F (36.7 C), temperature source Oral, resp. rate 18, height 5\' 6"  (1.676 m), weight 90.719 kg (200 lb), SpO2 92.00%. Physical Exam   Assessment/Plan Right thyroid mass-she's here for surgical resection. The surgery was discussed and she is ready to proceed  Antoin Dargis M 08/30/2011, 8:50 AM

## 2011-08-30 NOTE — Progress Notes (Signed)
Patient ID: [name], female   DOB: 30-Apr-1927, 77 y.o.   MRN: [id number] Subjective: Very sleepy but no complaints.   Objective: Vital signs in last 24 hours: Temp:  [97 F (36.1 C)-98.1 F (36.7 C)] 97.4 F (36.3 C) (11/29 1420) Pulse Rate:  [61-75] 65  (11/29 1420) Resp:  [12-24] 20  (11/29 1420) BP: (122-174)/(58-109) 152/62 mmHg (11/29 1420) SpO2:  [84 %-95 %] 95 % (11/29 1420) Weight:  [90.719 kg (200 lb)-93.532 kg (206 lb 3.2 oz)] 206 lb 3.2 oz (93.532 kg) (11/29 1420) Weight change:  Last BM Date: 08/30/11  Intake/Output from previous day:   Intake/Output this shift: Total I/O In: 1625 [I.V.:1625] Out: -   PHYSICAL EXAM: Pulse oximetry fluctuating but not lower than 84% which is felt to be her baseline. Voice normal. Neck incision intact without any swelling. JP functioning normally and holding a seal.  Lab Results:  [facility] 08/30/11 0658  WBC 6.1  HGB 13.8  HCT 42.4  PLT 290   BMET  Basename 08/30/11 1425 08/30/11 0658  NA -- 135  K -- 4.5  CL -- 94*  CO2 -- 32  GLUCOSE -- 102*  BUN -- 13  CREATININE -- 0.64  CALCIUM 8.5 9.1    Studies/Results: No results found.  Medications: I have reviewed the patient's current medications.  Assessment/Plan: Stable post op. Continue to monitor mental status and saturations. If she gets anhy worse, we can consider stepdown admission.  LOS: 0 days   Clella Mckeel H 08/30/2011, 4:09 PM

## 2011-08-30 NOTE — Preoperative (Signed)
Beta Blockers   Reason not to administer Beta Blockers:Not Applicable 

## 2011-08-30 NOTE — Anesthesia Postprocedure Evaluation (Signed)
  Anesthesia Post-op Note  Patient: [name]  Procedure(s) Performed:  THYROIDECTOMY  Patient Location: PACU  Anesthesia Type: General  Level of Consciousness: awake  Airway and Oxygen Therapy: Patient Spontanous Breathing  Post-op Pain: mild  Post-op Assessment: Post-op Vital signs reviewed  Post-op Vital Signs: stable  Complications: No apparent anesthesia complications

## 2011-08-30 NOTE — Anesthesia Preprocedure Evaluation (Signed)
Anesthesia Evaluation  Patient identified by MRN, date of birth, ID band Patient awake    Reviewed: Allergy & Precautions, H&P , NPO status , Patient's Chart, lab work & pertinent test results  History of Anesthesia Complications (+) PONV  Airway Mallampati: II  Neck ROM: Full    Dental  (+) Edentulous Upper and Edentulous Lower   Pulmonary asthma , COPD + rhonchi  + wheezing      Cardiovascular hypertension, + angina + CAD Regular Normal    Neuro/Psych    GI/Hepatic GERD-  ,  Endo/Other    Renal/GU      Musculoskeletal   Abdominal (+) obese,   Peds  Hematology   Anesthesia Other Findings   Reproductive/Obstetrics                           Anesthesia Physical Anesthesia Plan  ASA: III  Anesthesia Plan: General   Post-op Pain Management:    Induction: Intravenous  Airway Management Planned: Oral ETT  Additional Equipment:   Intra-op Plan:   Post-operative Plan: Extubation in OR  Informed Consent: I have reviewed the patients History and Physical, chart, labs and discussed the procedure including the risks, benefits and alternatives for the proposed anesthesia with the patient or authorized representative who has indicated his/her understanding and acceptance.     Plan Discussed with: CRNA and Surgeon  Anesthesia Plan Comments:         Anesthesia Quick Evaluation

## 2011-08-30 NOTE — Transfer of Care (Signed)
Immediate Anesthesia Transfer of Care Note  Patient: [name]  Procedure(s) Performed:  THYROIDECTOMY  Patient Location: PACU  Anesthesia Type: General  Level of Consciousness: awake, alert  and oriented  Airway & Oxygen Therapy: Patient Spontanous Breathing and Patient connected to face mask oxygen  Post-op Assessment: Report given to PACU RN  Post vital signs: Reviewed and stable  Complications: No apparent anesthesia complications

## 2011-08-30 NOTE — Anesthesia Postprocedure Evaluation (Signed)
  Anesthesia Post-op Note  Patient: [name]  Procedure(s) Performed:  THYROIDECTOMY  Patient Location: PACU  Anesthesia Type: General  Level of Consciousness: awake  Airway and Oxygen Therapy: Patient connected to nasal cannula oxygen  Post-op Pain: mild  Post-op Assessment: Post-op Vital signs reviewed  Post-op Vital Signs: stable  Complications: No apparent anesthesia complications

## 2011-08-31 ENCOUNTER — Encounter (HOSPITAL_COMMUNITY): Payer: Self-pay | Admitting: Otolaryngology

## 2011-08-31 LAB — CALCIUM
Calcium: 7.8 mg/dL — ABNORMAL LOW (ref 8.4–10.5)
Calcium: 7.9 mg/dL — ABNORMAL LOW (ref 8.4–10.5)

## 2011-08-31 MED ORDER — BISACODYL 10 MG RE SUPP
10.0000 mg | Freq: Four times a day (QID) | RECTAL | Status: DC | PRN
  Administered 2011-08-31: 10 mg via RECTAL
  Filled 2011-08-31: qty 1

## 2011-08-31 MED ORDER — LEVOTHYROXINE SODIUM 125 MCG PO TABS: 125.0000 ug | ORAL_TABLET | Freq: Every day | ORAL | Status: DC

## 2011-08-31 MED ORDER — LEVOTHYROXINE SODIUM 125 MCG PO TABS
125.0000 ug | ORAL_TABLET | Freq: Every day | ORAL | Status: AC
  Administered 2011-08-31 – 2011-09-01 (×2): 125 ug via ORAL
  Filled 2011-08-31 (×3): qty 1

## 2011-08-31 MED ORDER — SODIUM CHLORIDE 0.9 % IV SOLN
1.0000 g | Freq: Once | INTRAVENOUS | Status: AC
  Administered 2011-08-31: 1 g via INTRAVENOUS
  Filled 2011-08-31: qty 10

## 2011-08-31 MED ORDER — CALCIUM CARBONATE 1250 (500 CA) MG PO TABS
1.0000 | ORAL_TABLET | Freq: Three times a day (TID) | ORAL | Status: AC
  Administered 2011-08-31 – 2011-09-01 (×5): 500 mg via ORAL
  Filled 2011-08-31 (×8): qty 1

## 2011-08-31 NOTE — Discharge Summary (Signed)
Physician Discharge Summary  Patient ID: [name] MRN: [id number] DOB/AGE: 75-Oct-1928 75 y.o.  Admit date: 08/30/2011 Discharge date: 08/31/2011  Admission Diagnoses:  Discharge Diagnoses:  Active Problems:  * No active hospital problems. *    Discharged Condition: good  Hospital Course: She is here for thyroid removal of the right lobe. She is doing well. Wound is excellent. She has a 7.9 calcium at the 12:00 draw and I'm waiting for the morning draw. If it is above 8 and she will be discharged. She had the drain removed. Her voice is excellent. She will followup in one week.  Consults: none  Significant Diagnostic Studies: None  Treatments: None  Discharge Exam: Blood pressure 136/57, pulse 76, temperature 97.7 F (36.5 C), temperature source Oral, resp. rate 18, height 5\' 6"  (1.676 m), weight 93.532 kg (206 lb 3.2 oz), SpO2 94.00%. She is awake and alert. Voice is excellent. her wound looks great and the JP was removed. She's having no green problems. Heart is regular. Lungs are clear.  Disposition:   Discharge Orders    Future Appointments: Provider: Department: Dept Phone: Center:   09/06/2011 2:00 PM [facility] [phone] None     Current Discharge Medication List    START taking these medications   Details  HYDROmorphone (DILAUDID) 2 MG tablet Take 1 tablet (2 mg total) by mouth every 4 (four) hours as needed for pain. Qty: 30 tablet, Refills: 0    levothyroxine (SYNTHROID) 125 MCG tablet Take 1 tablet (125 mcg total) by mouth daily. Qty: 30 tablet, Refills: 3      CONTINUE these medications which have NOT CHANGED   Details  ALPRAZolam (XANAX) 0.25 MG tablet Take 0.25 mg by mouth at bedtime as needed. For sleep.    aspirin EC 81 MG tablet Take 1 tablet (81 mg total) by mouth daily. Qty: 150 tablet, Refills: 2    atenolol (TENORMIN) 25 MG tablet Take 25 mg by mouth daily.      cholecalciferol (VITAMIN D) 1000 UNITS tablet  Take 1,000 Units by mouth daily.      esomeprazole (NEXIUM) 40 MG capsule Take 1 capsule (40 mg total) by mouth daily. Qty: 30 capsule, Refills: 5   Associated Diagnoses: GERD (gastroesophageal reflux disease)    fluticasone (FLONASE) 50 MCG/ACT nasal spray Place 2 sprays into the nose daily.      mesalamine (PENTASA) 250 MG CR capsule Take 1,000 mg by mouth 4 (four) times daily.          SignedLeonette Most 08/31/2011, 7:24 AM

## 2011-08-31 NOTE — Progress Notes (Signed)
She is doing well. She has no complaints specifically. Her voice sounds normal. She is taking fluids well. The wound looks excellent and the JP drain was removed. Her calcium level was 8.5 and and the last test was 7.9. I will need to see another calcium level before discharging her. She will be discharged on Synthroid and possibly calcium but right now not sure if she will need to be treated.

## 2011-09-01 ENCOUNTER — Other Ambulatory Visit: Payer: Self-pay

## 2011-09-01 DIAGNOSIS — I4891 Unspecified atrial fibrillation: Secondary | ICD-10-CM

## 2011-09-01 LAB — CARDIAC PANEL(CRET KIN+CKTOT+MB+TROPI)
CK, MB: 5.3 ng/mL — ABNORMAL HIGH (ref 0.3–4.0)
CK, MB: 6.2 ng/mL (ref 0.3–4.0)
CK, MB: 6.2 ng/mL (ref 0.3–4.0)
Relative Index: INVALID (ref 0.0–2.5)
Troponin I: <0.3 ng/mL (ref <0.30)
Troponin I: <0.3 ng/mL (ref <0.30)

## 2011-09-01 LAB — CALCIUM: Calcium: 9.3 mg/dL (ref 8.4–10.5)

## 2011-09-01 LAB — GLUCOSE, CAPILLARY: Glucose-Capillary: 126 mg/dL — ABNORMAL HIGH (ref 70–99)

## 2011-09-01 MED ORDER — IPRATROPIUM BROMIDE 0.02 % IN SOLN
0.5000 mg | Freq: Four times a day (QID) | RESPIRATORY_TRACT | Status: DC | PRN
  Administered 2011-09-01: 0.5 mg via RESPIRATORY_TRACT
  Filled 2011-09-01 (×2): qty 2.5

## 2011-09-01 MED ORDER — METOPROLOL TARTRATE 25 MG PO TABS
25.0000 mg | ORAL_TABLET | Freq: Two times a day (BID) | ORAL | Status: DC
  Administered 2011-09-01 – 2011-09-02 (×2): 25 mg via ORAL
  Filled 2011-09-01 (×3): qty 1

## 2011-09-01 MED ORDER — METOPROLOL TARTRATE 25 MG PO TABS
25.0000 mg | ORAL_TABLET | Freq: Four times a day (QID) | ORAL | Status: DC
  Administered 2011-09-01: 25 mg via ORAL
  Filled 2011-09-01 (×5): qty 1

## 2011-09-01 MED ORDER — METOPROLOL TARTRATE 1 MG/ML IV SOLN
5.0000 mg | Freq: Once | INTRAVENOUS | Status: AC
  Administered 2011-09-01: 5 mg via INTRAVENOUS
  Filled 2011-09-01: qty 5

## 2011-09-01 MED ORDER — METOPROLOL TARTRATE 1 MG/ML IV SOLN
2.5000 mg | Freq: Once | INTRAVENOUS | Status: AC
  Administered 2011-09-01: 2.5 mg via INTRAVENOUS
  Filled 2011-09-01: qty 5

## 2011-09-01 MED ORDER — LEVALBUTEROL HCL 0.63 MG/3ML IN NEBU
0.6300 mg | INHALATION_SOLUTION | Freq: Four times a day (QID) | RESPIRATORY_TRACT | Status: DC
  Administered 2011-09-01 – 2011-09-03 (×5): 0.63 mg via RESPIRATORY_TRACT
  Filled 2011-09-01 (×11): qty 3

## 2011-09-01 MED ORDER — FLUTICASONE PROPIONATE 50 MCG/ACT NA SUSP
1.0000 | Freq: Every day | NASAL | Status: DC
  Administered 2011-09-01 – 2011-09-03 (×3): 1 via NASAL
  Filled 2011-09-01: qty 16

## 2011-09-01 MED ORDER — ALUM & MAG HYDROXIDE-SIMETH 200-200-20 MG/5ML PO SUSP
30.0000 mL | Freq: Once | ORAL | Status: AC
  Administered 2011-09-01: 30 mL via ORAL

## 2011-09-01 MED ORDER — ASPIRIN 325 MG PO TABS
325.0000 mg | ORAL_TABLET | Freq: Once | ORAL | Status: AC
  Administered 2011-09-01: 325 mg via ORAL
  Filled 2011-09-01: qty 1

## 2011-09-01 NOTE — Progress Notes (Signed)
Report given to [place], [place] in 2900.  Pt transferred to 2900 and accompanied by Wes, Rapid Response RN.  Offered to call patient's family or friends-patient declines at this time.  Will continue to monitor.

## 2011-09-01 NOTE — Progress Notes (Signed)
CRITICAL VALUE ALERT  Critical value received: CK MB 6.2 Date of notification: 09/01/2011 Time of notification: 1530  Critical value read back: Y  Nurse who received alert:  Carloyn Jaeger, RN, CCRN MD notified (1st page): Not notified.  MD aware of previously elevated CKMB; Troponin negative.  Time of first page: NA  MD notified (2nd page):NA  Time of second page:NA  Responding MD: NA  Time MD responded:  NA

## 2011-09-01 NOTE — Progress Notes (Signed)
Patient complaining of constipation.  Last bowel movement per patient and per nursing report was 08/31/11.  Patient abdomen is distended; states the pressure from her abdomen makes it uncomfortable to lay down.  Dr. Annalee Genta notified of patient complaint; new order received.  Will continue to monitor.

## 2011-09-01 NOTE — Progress Notes (Signed)
Subjective:    [name] is an elderly female with a history of thyroidectomy. She has a history of hypertension, TIAs, previous coronary artery disease, Crohn's disease, and COPD. We saw her last night for rapid atrial fibrillation.  She received several doses of IV metoprolol and converted to normal sinus rhythm. She remains in normal sinus rhythm. She feels much better this morning. She is on atenolol 25 mg a day as a home medication.      Marland Kitchen alum & mag hydroxide-simeth  30 mL Oral Once  . aspirin  325 mg Oral Once  . atenolol  25 mg Oral Daily  . calcium carbonate  1 tablet Oral TID  . calcium gluconate  1 g Intravenous Once  . levothyroxine  125 mcg Oral QAC breakfast  . mesalamine  1,000 mg Oral QID  . metoprolol  2.5 mg Intravenous Once  . metoprolol  5 mg Intravenous Once  . metoprolol tartrate  25 mg Oral Q6H  . pantoprazole  40 mg Oral Daily      . dextrose 5 % and 0.45% NaCl 1,000 mL (08/31/11 1744)    Objective:  Vital Signs in the last 24 hours: Blood pressure 157/73, pulse 80, temperature 97.9 F (36.6 C), temperature source Oral, resp. rate 22, height 5\' 6"  (1.676 m), weight 206 lb 3.2 oz (93.532 kg), SpO2 97.00%. Temp:  [97.6 F (36.4 C)-98.2 F (36.8 C)] 97.9 F (36.6 C) (12/01 0742) Pulse Rate:  [79-144] 80  (12/01 0700) Resp:  [16-22] 22  (12/01 0500) BP: (130-167)/(53-97) 157/73 mmHg (12/01 0700) SpO2:  [90 %-97 %] 97 % (12/01 0700)  Intake/Output from previous day: 11/30 0701 - 12/01 0700 In: 1325.4 [P.O.:290; I.V.:1035.4] Out: 401 [Urine:400; Stool:1] Intake/Output from this shift:    Physical Exam: The patient is alert and oriented x 3.  The mood and affect are normal.   Skin: warm and dry.  Color is normal.    HEENT:   the sclera are nonicteric.  The mucous membranes are moist.  The carotids are 2+ without bruits.  There is no thyromegaly.  There is no JVD.    Lungs: clear.  The chest wall is non tender.    Heart: regular rate with  a normal S1 and S2.  There are no murmurs, gallops, or rubs. The PMI is not displaced.     Abdomen: good bowel sounds.  There is no guarding or rebound.  There is no hepatosplenomegaly or tenderness.  There are no masses.   Extremities:  no clubbing, cyanosis, or edema.  The legs are without rashes.  The distal pulses are intact.   Neuro:  Cranial nerves II - XII are intact.  Motor and sensory functions are intact.     Lab Results:  [facility] 08/30/11 0658  WBC 6.1  HGB 13.8  PLT 290    Basename 08/30/11 0658  NA 135  K 4.5  CL 94*  CO2 32  GLUCOSE 102*  BUN 13  CREATININE 0.64    Basename 09/01/11 0401  TROPONINI <0.30   Her initial set of cardiac markers are negative.  Assessment/Plan:   1. Atrial fibrillation: The patient has now converted back to normal sinus rhythm. She does not appear to have had a myocardial infarction or a pulmonary list. I suspected that the atrial fibrillation was due to her surgery.  We should watch her for one more day. I think that she will be stable to go home tomorrow if she maintains  sinus rhythm.  We will change her atenolol to metoprolol 25 mg twice a day.  We can give her IV metoprolol as needed.   Disposition: We'll keep in the step down unit today. She'll be able to be discharged tomorrow if she remains stable. She'll followup with Dr. Simona Huh in [place].  Vesta Mixer, Montez Hageman., MD, [facility] 09/01/2011, 10:56 AM LOS: Day 2

## 2011-09-01 NOTE — Consult Note (Signed)
Reason for Consult: atrial fibrillation with RVR Referring Physician: Dr. Suzanna Obey.   [name] is an 77 y.o. female.  HPI: 77 yo woman s/p thyroidectomy doing well with PMH of HTN, TIAs, prior MI/CAD, Crohn's disease, COPD who woke up about 3:45 and felt constipated. She had relief with some medication and BM but chest pain and feeling strange returned with further fullness in abdomen/constipation sensation. Vitals obtained and HR as high as 150s leading to Cardiology consult. On exam, she's comfortable and HR irregular ranging from 128-140. Metoprolol 2.5 mg IV x1 given now. Otherwise, no other changes, no fever/chills, no sick contacts. She tells me she's had a fast HR in the past and she takes atenolol x 15 years. She also tells me that she has had an MI and that she thinks she may have a blockage that may need to be addressed.   Past Medical History  Diagnosis Date  . Essential hypertension, benign   . 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 Crohn's disease   . Spinal stenosis   . PSVT (paroxysmal supraventricular tachycardia)   . History of TIAs   . GERD (gastroesophageal reflux disease)   . 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 Carotid artery disease     RICA 50-69% 12/09  . PONV (postoperative nausea and vomiting)     years ago had N/V, but not recently  . Myocardial infarction   . COPD (chronic obstructive pulmonary disease)   . Thyroid mass   . Incontinence of urine   . Anxiety   . Arthritis     Past Surgical History  Procedure Date  . Left thoracotomy with wedge resection 2008    Left lower lobe  . Right tonsillectomy 2005  . Left thyroidectomy 2006  . Left breast biopsy 2011  . US echocardiography   . Cholecystectomy   . Cardiovascular stress test   . Abdominal hysterectomy   . Thyroidectomy 08/30/2011    Procedure: THYROIDECTOMY;  Surgeon: Leonette Most;  Location: [facility]  OR;  Service: ENT;  Laterality: Right;    Family History  Problem Relation Age of Onset  . Coronary artery disease Father     Died age 63  . Dementia Mother     Social History:  reports that she quit smoking about 17 years ago. Her smoking use included Cigarettes. She has never used smokeless tobacco. She reports that she does not drink alcohol or use illicit drugs.  Allergies:  Allergies  Allergen Reactions  . Codeine Nausea And Vomiting  . Demerol Other (See Comments)    Blood pressure  . Morphine And Related Nausea And Vomiting  . Penicillins Hives    Medications: I have reviewed the patient's current medications.  Results for orders placed during the hospital encounter of 08/30/11 (from the past 48 hour(s))  SURGICAL PCR SCREEN     Status: Normal   Collection Time   08/30/11  6:58 AM      Component Value Range Comment   MRSA, PCR NEGATIVE  NEGATIVE     Staphylococcus aureus NEGATIVE  NEGATIVE    BASIC METABOLIC PANEL     Status: Abnormal   Collection Time   08/30/11  6:58 AM      Component Value Range Comment   Sodium 135  135 - 145 (mEq/L)    Potassium 4.5  3.5 - 5.1 (mEq/L)  Chloride 94 (*) 96 - 112 (mEq/L)    CO2 32  19 - 32 (mEq/L)    Glucose, Bld 102 (*) 70 - 99 (mg/dL)    BUN 13  6 - 23 (mg/dL)    Creatinine, Ser 8.29  0.50 - 1.10 (mg/dL)    Calcium 9.1  8.4 - 10.5 (mg/dL)    GFR calc non Af Amer 81 (*) >90 (mL/min)    GFR calc Af Amer >90  >90 (mL/min)   CBC     Status: Abnormal   Collection Time   08/30/11  6:58 AM      Component Value Range Comment   WBC 6.1  4.0 - 10.5 (K/uL)    RBC 4.78  3.87 - 5.11 (MIL/uL)    Hemoglobin 13.8  12.0 - 15.0 (g/dL)    HCT 56.2  13.0 - 86.5 (%)    MCV 88.7  78.0 - 100.0 (fL)    MCH 28.9  26.0 - 34.0 (pg)    MCHC 32.5  30.0 - 36.0 (g/dL)    RDW 78.4 (*) 69.6 - 15.5 (%)    Platelets 290  150 - 400 (K/uL)   CALCIUM     Status: Normal   Collection Time   08/30/11  2:25 PM      Component Value Range Comment    Calcium 8.5  8.4 - 10.5 (mg/dL)   CALCIUM     Status: Normal   Collection Time   08/30/11  6:23 PM      Component Value Range Comment   Calcium 8.6  8.4 - 10.5 (mg/dL)   CALCIUM     Status: Abnormal   Collection Time   08/30/11 11:29 PM      Component Value Range Comment   Calcium 7.9 (*) 8.4 - 10.5 (mg/dL)   CALCIUM     Status: Abnormal   Collection Time   08/31/11  6:28 AM      Component Value Range Comment   Calcium 7.8 (*) 8.4 - 10.5 (mg/dL)   CALCIUM     Status: Abnormal   Collection Time   08/31/11 12:07 PM      Component Value Range Comment   Calcium 7.5 (*) 8.4 - 10.5 (mg/dL)   CALCIUM     Status: Abnormal   Collection Time   08/31/11  5:26 PM      Component Value Range Comment   Calcium 7.9 (*) 8.4 - 10.5 (mg/dL)   CALCIUM     Status: Normal   Collection Time   09/01/11 12:25 AM      Component Value Range Comment   Calcium 8.6  8.4 - 10.5 (mg/dL)     No results found.  Review of Systems  Constitutional: Positive for malaise/fatigue. Negative for fever, chills and diaphoresis.  HENT: Negative for hearing loss.   Eyes: Negative for blurred vision and double vision.  Respiratory: Negative for cough and hemoptysis.   Cardiovascular: Positive for chest pain and leg swelling. Negative for orthopnea.  Gastrointestinal: Positive for heartburn. Negative for nausea and vomiting.  Genitourinary: Negative for urgency and hematuria.  Musculoskeletal: Positive for joint pain. Negative for myalgias.  Skin: Negative for itching and rash.  Neurological: Negative for tingling, tremors, sensory change, weakness and headaches.  Psychiatric/Behavioral: Negative for depression, suicidal ideas and substance abuse.   Blood pressure 137/92, pulse 137, temperature 97.6 F (36.4 C), temperature source Oral, resp. rate 22, height 5\' 6"  (1.676 m), weight 93.532 kg (206 lb 3.2 oz), SpO2 93.00%.  Physical Exam  Constitutional: She is oriented to person, place, and time. She appears  well-developed and well-nourished. She appears distressed.  HENT:  Head: Normocephalic and atraumatic.  Nose: Nose normal.  Mouth/Throat: Oropharynx is clear and moist. No oropharyngeal exudate.  Eyes: Conjunctivae and EOM are normal. Pupils are equal, round, and reactive to light. No scleral icterus.  Neck: Normal range of motion. Neck supple. JVD present. No tracheal deviation present. No thyromegaly present.       Neck scar healing well; JVD 8 cm  Cardiovascular: Intact distal pulses.   No murmur heard.      Irregularly irregular  Respiratory: Effort normal and breath sounds normal. No respiratory distress. She has no wheezes.  GI: Soft. Bowel sounds are normal. She exhibits no distension. There is no tenderness. There is no rebound.  Musculoskeletal: Normal range of motion. She exhibits edema. She exhibits no tenderness.  Neurological: She is alert and oriented to person, place, and time. No cranial nerve deficit. Coordination normal.  Skin: Skin is warm and dry. No rash noted. She is not diaphoretic. No erythema.  Psychiatric: She has a normal mood and affect. Her behavior is normal.  Labs reviewed above; stable, normal calcium, BMP unrevealing, CBC stable EKG reviewed at bedside; atrial fibrillation with RVR Problem List S/p Thyroidectomy Multiple other medical problems CAD with prior MI HTN Atrial fibrillation Assessment/Plan: 77 yo woman s/p thyroidectomy with stable calcium levels now with atrial fibrillation +RVR.   Atrial fibrillation +RVR: currently giving metoprolol IV x 3 with initial good response. If continued response will transition to q6h PO metoprolol. If unresponsive will change to diltiazem.  - will assess for ischemic etiology, infectious drivers - trend troponins, transfer to telmetry - urinalysis - am labs with cbc/bmp Chest/Abdominal pain: trending troponins/CKMB, if does not respond to rate control and enzymes negative then expand differential to include  aortic disease and/or gastric/esophageal etiologies CAD: will give ASA 325 mg now - following closely  Turon Kilmer 09/01/2011, 4:24 AM

## 2011-09-01 NOTE — Progress Notes (Signed)
[name] is a 77 y.o. female patient. 1. GERD (gastroesophageal reflux disease)    Past Medical History  Diagnosis Date  . Essential hypertension, benign   . 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 Crohn's disease   . Spinal stenosis   . PSVT (paroxysmal supraventricular tachycardia)   . History of TIAs   . GERD (gastroesophageal reflux disease)   . 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 Carotid artery disease     RICA 50-69% 12/09  . PONV (postoperative nausea and vomiting)     years ago had N/V, but not recently  . Myocardial infarction   . COPD (chronic obstructive pulmonary disease)   . Thyroid mass   . Incontinence of urine   . Anxiety   . Arthritis    Current Facility-Administered Medications  Medication Dose Route Frequency Provider Last Rate Last Dose  . ALPRAZolam Prudy Feeler) tablet 0.25 mg  0.25 mg Oral QHS PRN Margit Banda Byers   0.25 mg at 08/31/11 2109  . alum & mag hydroxide-simeth (MAALOX/MYLANTA) 200-200-20 MG/5ML suspension 30 mL  30 mL Oral Once Leeann Must, MD   30 mL at 09/01/11 0431  . aspirin tablet 325 mg  325 mg Oral Once Leeann Must, MD      . atenolol (TENORMIN) tablet 25 mg  25 mg Oral Daily Leonette Most   25 mg at 08/31/11 7829  . bisacodyl (DULCOLAX) suppository 10 mg  10 mg Rectal Q6H PRN Barbee Cough   10 mg at 08/31/11 2330  . calcium carbonate (OS-CAL - dosed in mg of elemental calcium) tablet 500 mg of elemental calcium  1 tablet Oral TID Jonny Ruiz M Byers   500 mg of elemental calcium at 08/31/11 2106  . calcium gluconate 1 g in sodium chloride 0.9 % 100 mL IVPB  1 g Intravenous Once Leonette Most   1 g at 08/31/11 1505  . dextrose 5 %-0.45 % sodium chloride infusion   Intravenous Continuous Leonette Most 125 mL/hr at 08/31/11 1744 1,000 mL at 08/31/11 1744  . HYDROmorphone (DILAUDID) tablet 2 mg  2 mg Oral Q4H PRN Leonette Most   2 mg at 08/31/11 0252  .  levothyroxine (SYNTHROID, LEVOTHROID) tablet 125 mcg  125 mcg Oral QAC breakfast Leonette Most   125 mcg at 08/31/11 1505  . mesalamine (PENTASA) CR capsule 1,000 mg  1,000 mg Oral QID Margit Banda Byers   1,000 mg at 08/31/11 2106  . metoprolol (LOPRESSOR) injection 2.5 mg  2.5 mg Intravenous Once Leeann Must, MD   2.5 mg at 09/01/11 0430  . metoprolol (LOPRESSOR) injection 5 mg  5 mg Intravenous Once Leeann Must, MD   5 mg at 09/01/11 0452  . metoprolol tartrate (LOPRESSOR) tablet 25 mg  25 mg Oral Q6H Leeann Must, MD      . ondansetron [facility]) tablet 4 mg  4 mg Oral Q4H PRN Leonette Most       Or  . ondansetron [facility]) injection 4 mg  4 mg Intravenous Q4H PRN Margit Banda Byers   4 mg at 08/31/11 1627  . pantoprazole (PROTONIX) EC tablet 40 mg  40 mg Oral Daily John M Byers   40 mg at 08/31/11 1250   Allergies  Allergen Reactions  . Codeine Nausea And Vomiting  . Demerol Other (See Comments)    Blood pressure  .  Morphine And Related Nausea And Vomiting  . Penicillins Hives   Active Problems: Post op hypocalcemia, atrial fib  Blood pressure 157/73, pulse 80, temperature 97.9 F (36.6 C), temperature source Oral, resp. rate 22, height 5\' 6"  (1.676 m), weight 93.532 kg (206 lb 3.2 oz), SpO2 97.00%.  SubjectiveNo complaints, No chest pain  Objective Incision intact no swelling Airway and voice stable Nl HR, BP stable  Assessment & Plan 1. Ca levels stable 2. Monitor cardiac condition and await [place] [facility]. 3. Plan d/c when cleared.    Jeraline Marcinek L 09/01/2011

## 2011-09-01 NOTE — Progress Notes (Signed)
Dr. Leeann Must assessed patient at [phone]; new orders received.  Waiting for room to be cleaned on stepdown unit.  Will continue to monitor.

## 2011-09-01 NOTE — Progress Notes (Signed)
Patient complaining of epigastric pain and states it is the same pain she felt when she had a previous myocardial infarction.  Vitals: blood pressure 167/80, heart rate 114-130, oxygen 94% on 3L via nasal cannula, respiratory rate 18, temperature 97.80F.  Lorenza Burton, RN with rapid response notified and assessed patient.  EKG was obtained.  Dr. Annalee Genta notified of patient vitals, symptoms, and results of EKG.  New order for cardiology consult, cardiac enzymes, and to transfer the patient to the stepdown unit.  Will continue to monitor.

## 2011-09-02 DIAGNOSIS — I4891 Unspecified atrial fibrillation: Secondary | ICD-10-CM

## 2011-09-02 LAB — CALCIUM: Calcium: 8.4 mg/dL (ref 8.4–10.5)

## 2011-09-02 MED ORDER — WARFARIN VIDEO: Freq: Once | Status: DC

## 2011-09-02 MED ORDER — METOPROLOL TARTRATE 50 MG PO TABS
50.0000 mg | ORAL_TABLET | Freq: Two times a day (BID) | ORAL | Status: DC
Start: 2011-09-02 — End: 2011-09-03
  Administered 2011-09-02 – 2011-09-03 (×2): 50 mg via ORAL
  Filled 2011-09-02 (×3): qty 1

## 2011-09-02 MED ORDER — METOPROLOL TARTRATE 1 MG/ML IV SOLN
5.0000 mg | Freq: Four times a day (QID) | INTRAVENOUS | Status: DC | PRN
  Administered 2011-09-02: 5 mg via INTRAVENOUS
  Filled 2011-09-02: qty 5

## 2011-09-02 MED ORDER — METOPROLOL TARTRATE 25 MG PO TABS
25.0000 mg | ORAL_TABLET | Freq: Two times a day (BID) | ORAL | Status: AC
  Administered 2011-09-02: 25 mg via ORAL

## 2011-09-02 MED ORDER — COUMADIN BOOK
Freq: Once | Status: AC
  Administered 2011-09-02: 12:00:00
  Filled 2011-09-02: qty 1

## 2011-09-02 MED ORDER — HEPARIN SOD (PORCINE) IN D5W 100 UNIT/ML IV SOLN
1400.0000 [IU]/h | INTRAVENOUS | Status: DC
  Administered 2011-09-02: 1200 [IU]/h via INTRAVENOUS
  Administered 2011-09-03: 1400 [IU]/h via INTRAVENOUS
  Filled 2011-09-02 (×3): qty 250

## 2011-09-02 MED ORDER — WARFARIN SODIUM 5 MG PO TABS
5.0000 mg | ORAL_TABLET | Freq: Once | ORAL | Status: AC
  Administered 2011-09-02: 5 mg via ORAL
  Filled 2011-09-02: qty 1

## 2011-09-02 NOTE — Progress Notes (Signed)
ANTICOAGULATION CONSULT NOTE - Initial Consult  Pharmacy Consult for Heparin/Warfarin Indication: Atrial Fibrillation  Allergies  Allergen Reactions  . Codeine Nausea And Vomiting  . Demerol Other (See Comments)    Blood pressure  . Morphine And Related Nausea And Vomiting  . Penicillins Hives    Patient Measurements: Height: 5\' 6"  (167.6 cm) Weight: 206 lb 3.2 oz (93.532 kg) IBW/kg (Calculated) : 59.3  Adjusted Weight 79.9 kg  Vital Signs: Temp: 98.2 F (36.8 C) (12/02 1148) Temp src: Oral (12/02 1148) BP: 137/63 mmHg (12/02 0939) Pulse Rate: 111  (12/02 0939)  Labs:  Basename 09/01/11 1936 09/01/11 1348 09/01/11 0401  HGB -- -- --  HCT -- -- --  PLT -- -- --  APTT -- -- --  LABPROT -- -- --  INR -- -- --  HEPARINUNFRC -- -- --  CREATININE -- -- --  CKTOTAL 89 89 93  CKMB 6.2* 6.2* 5.3*  TROPONINI <0.30 <0.30 <0.30   Estimated Creatinine Clearance: 62.5 ml/min (by C-G formula based on Cr of 0.64).  Medical History: Past Medical History  Diagnosis Date  . Essential hypertension, benign   . 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 Crohn's disease   . Spinal stenosis   . PSVT (paroxysmal supraventricular tachycardia)   . History of TIAs   . GERD (gastroesophageal reflux disease)   . 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 Carotid artery disease     RICA 50-69% 12/09  . PONV (postoperative nausea and vomiting)     years ago had N/V, but not recently  . Myocardial infarction   . COPD (chronic obstructive pulmonary disease)   . Thyroid mass   . Incontinence of urine   . Anxiety   . Arthritis     Medications:  Prescriptions prior to admission  Medication Sig Dispense Refill  . ALPRAZolam (XANAX) 0.25 MG tablet Take 0.25 mg by mouth at bedtime as needed. For sleep.      Marland Kitchen aspirin EC 81 MG tablet Take 1 tablet (81 mg total) by mouth daily.  150 tablet  2  . atenolol  (TENORMIN) 25 MG tablet Take 25 mg by mouth daily.        . cholecalciferol (VITAMIN D) 1000 UNITS tablet Take 1,000 Units by mouth daily.        Marland Kitchen esomeprazole (NEXIUM) 40 MG capsule Take 1 capsule (40 mg total) by mouth daily.  30 capsule  5  . fluticasone (FLONASE) 50 MCG/ACT nasal spray Place 2 sprays into the nose daily.        . mesalamine (PENTASA) 250 MG CR capsule Take 1,000 mg by mouth 4 (four) times daily.         Assessment: 77 yo admitted for surgical removal of thyroid mass, now with Atrial Fibrillation and plans to start anticoagulation due to high risk of recurrent CVA or thrombotic event.  Her baseline protime is within normal limits and she is currently without noted bleeding complications.  Will start IV heparin without a bolus and titrate based on her Xa levels.    Goal of Therapy:  INR 2-3 Xa 0.3-0.5   Plan:   Begin IV heparin at 1200 units/hr  Obtain a heparin level 8 hours after start  Daily heparin level/CBC  Warfarin 5 mg x 1 today  Obtain a PT/INR daily while on Warfarin  Warfarin teaching book and video  Nadara Mustard, PharmD., MS Pager:  [phone] 09/02/2011,12:01 PM

## 2011-09-02 NOTE — Progress Notes (Signed)
    Subjective:    [name] is an elderly female with a history of thyroidectomy. She has a history of hypertension, TIAs, previous coronary artery disease, Crohn's disease, and COPD. We saw her last night for rapid atrial fibrillation.  She received several doses of IV metoprolol and converted to normal sinus rhythm.   She was in NSR for most of the day but  She went back into A-fib this AM     . calcium carbonate  1 tablet Oral TID  . fluticasone  1 spray Each Nare Daily  . levalbuterol  0.63 mg Nebulization Q6H  . levothyroxine  125 mcg Oral QAC breakfast  . mesalamine  1,000 mg Oral QID  . metoprolol tartrate  25 mg Oral BID  . pantoprazole  40 mg Oral Daily      . dextrose 5 % and 0.45% NaCl 10 mL/hr at 09/02/11 0000    Objective:  Vital Signs in the last 24 hours: Blood pressure 137/63, pulse 111, temperature 98.3 F (36.8 C), temperature source Oral, resp. rate 20, height 5\' 6"  (1.676 m), weight 206 lb 3.2 oz (93.532 kg), SpO2 94.00%. Temp:  [97.9 F (36.6 C)-98.4 F (36.9 C)] 98.3 F (36.8 C) (12/02 0719) Pulse Rate:  [47-111] 111  (12/02 0939) Resp:  [20] 20  (12/01 1608) BP: (137-171)/(59-123) 137/63 mmHg (12/02 0939) SpO2:  [90 %-98 %] 94 % (12/02 0800)  Intake/Output from previous day: 12/01 0701 - 12/02 0700 In: 110 [I.V.:110] Out: 950 [Urine:950] Intake/Output from this shift: Total I/O In: -  Out: 600 [Urine:600]  Physical Exam: The patient is alert and oriented x 3.  The mood and affect are normal.   Skin: warm and dry.  Color is normal.    HEENT:   the sclera are nonicteric.  The mucous membranes are moist.  The carotids are 2+ without bruits.  There is no thyromegaly.  There is no JVD.  Surgical scar over thyroid area.  Lungs: clear.  The chest wall is non tender.    Heart: Irreg. Irreg.  Tachycardic  Abdomen: good bowel sounds.  There is no guarding or rebound.  There is no hepatosplenomegaly or tenderness.  There are no masses.    Extremities:  no clubbing, cyanosis, or edema.  The legs are without rashes.  The distal pulses are intact.   Neuro:  Cranial nerves II - XII are intact.  Motor and sensory functions are intact.     Lab Results: No results found for this basename: WBC:2,HGB:2,PLT:2 in the last 72 hours No results found for this basename: NA:2,K:2,CL:2,CO2:2,GLUCOSE:2,BUN:2,CREATININE:2 in the last 72 hours  Basename 09/01/11 1936 09/01/11 1348  TROPONINI <0.30 <0.30   Her initial set of cardiac markers are negative. Tele:  A-Fib. With RVR  Assessment/Plan:   1. Atrial fibrillation: The patient has now  Converted back into A-Fib.  Will give her an extra metoprolol 25 mg PO and 5 mg IV.  Will increase her scheduled dose to Metoprolol 50 mg BID.  Echo in Sept. Reveals normal LV function  She is 77 years old, has a Hx of HTN, hx of TIA.  She will need heparin and coumadin.  D/W Dr. Annalee Genta who has given the OK to start heparin today.  Transfer to our service since all the current issues are cardiac.  ENT will follow along to monitor the thyroid wound.   Vesta Mixer, Montez Hageman., MD, [facility] 09/02/2011, 11:11 AM LOS: Day 3

## 2011-09-02 NOTE — Progress Notes (Signed)
Patient ID: [name], female   DOB: [birth year], 77 y.o.   MRN: [id number] Subjective: Pt stable, no neck complaints  Objective: Vital signs in last 24 hours: Temp:  [97.9 F (36.6 C)-98.4 F (36.9 C)] 98.2 F (36.8 C) (12/02 1148) Pulse Rate:  [47-111] 98  (12/02 1241) Resp:  [20] 20  (12/01 1608) BP: (103-165)/(59-123) 121/89 mmHg (12/02 1241) SpO2:  [90 %-97 %] 96 % (12/02 1432) Weight change:  Last BM Date: 09/01/11  Intake/Output from previous day: 12/01 0701 - 12/02 0700 In: 110 [I.V.:110] Out: 950 [Urine:950] Intake/Output this shift: Total I/O In: -  Out: 600 [Urine:600]   CMP     Component Value Date/Time   NA 135 08/30/2011 0658   K 4.5 08/30/2011 0658   CL 94* 08/30/2011 0658   CO2 32 08/30/2011 0658   GLUCOSE 102* 08/30/2011 0658   BUN 13 08/30/2011 0658   CREATININE 0.64 08/30/2011 0658   CALCIUM 8.2* 09/02/2011 1243   PROT 6.8 07/05/2010 1503   ALBUMIN 3.5 07/05/2010 1503   AST 14 07/05/2010 1503   ALT 9 07/05/2010 1503   ALKPHOS 73 07/05/2010 1503   BILITOT 0.4 07/05/2010 1503   GFRNONAA 81* 08/30/2011 0658   GFRAA >90 08/30/2011 7829      PHYSICAL EXAM:  Neck: Incision intact, no swelling or erythema   Assessment/Plan: POD #3 s/p Procedure(s): THYROIDECTOMY Post op hypocalcemia improved with Ca tx, stable from a surgical standpt. Pt with Afib, agree with Dr. Harvie Bridge plan to begin anticoag tx. Will Follow.    [name] L 09/02/2011, 3:44 PM

## 2011-09-03 ENCOUNTER — Telehealth: Payer: Self-pay | Admitting: Cardiology

## 2011-09-03 DIAGNOSIS — I4891 Unspecified atrial fibrillation: Secondary | ICD-10-CM

## 2011-09-03 DIAGNOSIS — J449 Chronic obstructive pulmonary disease, unspecified: Secondary | ICD-10-CM

## 2011-09-03 DIAGNOSIS — K219 Gastro-esophageal reflux disease without esophagitis: Secondary | ICD-10-CM

## 2011-09-03 DIAGNOSIS — Z9889 Other specified postprocedural states: Secondary | ICD-10-CM

## 2011-09-03 LAB — PROTIME-INR: INR: 0.96 (ref 0.00–1.49)

## 2011-09-03 MED ORDER — CALCIUM GLUCONATE 500 MG PO TABS: 500.0000 mg | ORAL_TABLET | Freq: Three times a day (TID) | ORAL | Status: DC

## 2011-09-03 MED ORDER — METOPROLOL TARTRATE 50 MG PO TABS: 50.0000 mg | ORAL_TABLET | Freq: Two times a day (BID) | ORAL | Status: DC

## 2011-09-03 NOTE — Progress Notes (Signed)
Patient ID: Hanifa K [name], female   DOB: 1927-02-24, 77 y.o.   MRN: [id number]    Subjective:    Mrs. [name] is an elderly female with a history of thyroidectomy. She has a history of hypertension, TIAs, previous coronary artery disease, Crohn's disease, and COPD. She has had PAF post thyroidectomy.  In NSR today.  Not ideal candidate for amiodarone with recent thyroid surgery.  Wants to go home  Sees Dr Durenda Hurt in [place]     . calcium carbonate  1 tablet Oral TID  . coumadin book   Does not apply Once  . fluticasone  1 spray Each Nare Daily  . levalbuterol  0.63 mg Nebulization Q6H  . mesalamine  1,000 mg Oral QID  . metoprolol tartrate  50 mg Oral BID  . metoprolol tartrate  25 mg Oral BID  . pantoprazole  40 mg Oral Daily  . warfarin  5 mg Oral ONCE-1800  . warfarin   Does not apply Once  . DISCONTD: metoprolol tartrate  25 mg Oral BID      . dextrose 5 % and 0.45% NaCl 10 mL/hr at 09/02/11 1800  . heparin Stopped (09/03/11 0752)    Objective:  Vital Signs in the last 24 hours: Blood pressure 147/76, pulse 66, temperature 97.6 F (36.4 C), temperature source Oral, resp. rate 20, height 5\' 6"  (1.676 m), weight 93.532 kg (206 lb 3.2 oz), SpO2 95.00%. Temp:  [97.6 F (36.4 C)-98.7 F (37.1 C)] 97.6 F (36.4 C) (12/03 0400) Pulse Rate:  [66-111] 66  (12/03 0600) BP: (103-155)/(59-89) 147/76 mmHg (12/03 0400) SpO2:  [91 %-97 %] 95 % (12/03 0600)  Intake/Output from previous day: 12/02 0701 - 12/03 0700 In: 714.9 [P.O.:380; I.V.:334.9] Out: 1425 [Urine:1425] Intake/Output from this shift:    Physical Exam: The patient is alert and oriented x 3.  The mood and affect are normal.   Skin: warm and dry.  Color is normal.    HEENT:   the sclera are nonicteric.  The mucous membranes are moist.  The carotids are 2+ without bruits.  There is no thyromegaly.  There is no JVD.  Surgical scar over thyroid area.  Lungs: clear.  The chest wall is non tender.    Heart:  Irreg. Irreg.  Tachycardic  Abdomen: good bowel sounds.  There is no guarding or rebound.  There is no hepatosplenomegaly or tenderness.  There are no masses.   Extremities:  no clubbing, cyanosis, or edema.  The legs are without rashes.  The distal pulses are intact.   Neuro:  Cranial nerves II - XII are intact.  Motor and sensory functions are intact.     Lab Results: No results found for this basename: WBC:2,HGB:2,PLT:2 in the last 72 hours No results found for this basename: NA:2,K:2,CL:2,CO2:2,GLUCOSE:2,BUN:2,CREATININE:2 in the last 72 hours  Basename 09/01/11 1936 09/01/11 1348  TROPONINI <0.30 <0.30   Telemetry:  NSR rate 78  Assessment/Plan:   PAF:  Patient wants to go home Currently in NSR.  Low normal EF and presumed CAD by myovue in 10/12 inferior scar mild periinfarct ischemia D/C heparin.  Send home on 50 bid of lopressor.  Outpatient F/U with SM to consider amiodarone and coumadin.  Would let her recover from surgery And let neck wound heal before deciding on full dose coumadin.         Charlton Haws 7:56 AM 09/03/2011; 09/03/2011, 7:52 AM LOS: Day 4

## 2011-09-03 NOTE — Discharge Summary (Signed)
Discharge Summary   Patient ID: Latoya [name],  MRN: [id number], DOB/AGE: 1927/05/09 77 y.o.  Admit date: 08/30/2011 Discharge date: 09/03/2011  Discharge Diagnoses Principal Problem:  *Atrial fibrillation Active Problems:  Chest pain  Preoperative evaluation to rule out surgical contraindication  Essential hypertension, benign  Coronary atherosclerosis of native coronary artery  S/P thyroidectomy  GERD (gastroesophageal reflux disease)  COPD (chronic obstructive pulmonary disease)  Constipation  Hypocalcemia   Allergies Allergies  Allergen Reactions  . Codeine Nausea And Vomiting  . Demerol Other (See Comments)    Blood pressure  . Morphine And Related Nausea And Vomiting  . Penicillins Hives    Procedures  Right thyroidectomy  Patient was taken aberrantly supine position after general endotracheal tube anesthesia was placed in the supine position prepped and draped in the usual sterile manner the lesion was made after calibrating the Nims monitor endotracheal tube. An incision was made in the same location as previous. The flap was elevated superiorly and inferiorly and the retractor was positioned. The midline was identified and divided. The isthmus of the thyroid was identified. Dissection was carried out to the right dissecting out the thyroid gland carefully layer by layer along its capsule. What appeared to be a parathyroid gland was identified inferiorly and carefully dissected off the gland and preserved. The upper pole was dissected clamping off the vessels layer by layer. The dissection is taken into the region of the recurrent nerve where it was identified. The nerve monitor was used to stimulate the nerve there was good stimulation and then the dissection was carried up above this nerve removing berry's ligament and dissecting the thyroid gland off the trachea. Sent for permanent section. Wound was irrigated copiously and a #7 JP drain was positioned to the strap  muscles were closed with interrupted 3-0 chromic. The skin was closed interrupted 3-0 chromic and then a Dermabond to close the skin. The whole drain was suture with a 3-0 nylon. Patient is awake and brought to cover stable condition counts correct.  History of Present Illness: This is [name] is a 47-year-old female with past medical history significant for CAD (RCA 50-69% on 12/09, Myoview consistent with inferior wall scar and peri-infarct ischemia), carotid artery disease, history of TIAs, hypertension, Crohn's disease, lung cancer, COPD and history of left thyroidectomy admitted to [facility] for scheduled right thyroidectomy on 11/29.  Hospital Course: FNA of right thyroid mass revealed benign cells, however patient was emphatic about having the mass removed. She underwent the aforementioned operation without incident and was stable postop. 02 saturations dipped to 84%- consistent with her baseline status- improved with O2 supplementation.  On POD #1, she was found to be mildly hypocalcemic at 7.9 and she was supplemented to resolution. She also complained of constipation which was relieved by stool softener.  On POD #2, she complained of epigastric pain consistent with a previous MI she had experienced. She was found to be tachycardic in the 150s, EKG revealed atrial fibrillation and cardiac enzymes were ordered. She was subsequently transferred to step down and started on Metroprolol with successful rate-control. She was started on heparin and Coumadin for anticoagulation. She later converted to NSR and cardiac enzymes were negative x3. Metoprolol IV was switched to PO and will replace her atenolol upon discharge.  She is currently stable in good condition and will be discharged today. She has recovered well, is currently in normal sinus rhythm; however her postop status lends her to be a poor Coumadin candidate  at this. She will followup later this week with Dr. Diona Browner whereupon further  recommendations of full dose Coumadin amiodarone will be assessed. She will be discharged on Synthroid and will followup with ENT in one week.  Discharge Vitals:  Blood pressure 161/67, pulse 66, temperature 98.2 F (36.8 C), temperature source Oral, resp. rate 20, height 5\' 6"  (1.676 m), weight 93.532 kg (206 lb 3.2 oz), SpO2 97.00%.   Weight change:   Labs:  Lab 09/03/11 0720 09/02/11 2321 09/02/11 1750 08/30/11 0658  NA -- -- -- 135  K -- -- -- 4.5  CL -- -- -- 94*  CO2 -- -- -- 32  BUN -- -- -- 13  CREATININE -- -- -- 0.64  CALCIUM 8.1* 8.1* 8.4 --  PROT -- -- -- --  BILITOT -- -- -- --  ALKPHOS -- -- -- --  ALT -- -- -- --  AST -- -- -- --  AMYLASE -- -- -- --  LIPASE -- -- -- --  GLUCOSE -- -- -- 102*   No results found for this basename: HGBA1C in the last 72 hours Recent Labs  Basename 09/01/11 1936 09/01/11 1348 09/01/11 0401   CKTOTAL 89 89 93   CKMB 6.2* 6.2* 5.3*   CKMBINDEX -- -- --   TROPONINI <0.30 <0.30 <0.30   No results found for this basename: POCBNP in the last 72 hours No results found for this basename: CHOL,HDL,LDLCALC,TRIG,CHOLHDL,LDLDIRECT in the last 72 hours No results found for this basename: TSH,T4TOTAL,FREET3,T3FREE,THYROIDAB in the last 72 hours  Disposition:  Discharge Orders    Future Appointments: Provider: Department: Dept Phone: Center:   09/06/2011 2:00 PM [facility] [phone] None   09/07/2011 3:20 PM [name], MD [facility] [phone] [id number]     Follow-up Information    Follow up with Nona Dell, MD on 09/07/2011. (At 3:20 PM. )    Contact information:   [address] [place] [address] [phone]        Discharge Medications:  Current Discharge Medication List    START taking these medications   Details  HYDROmorphone (DILAUDID) 2 MG tablet Take 1 tablet (2 mg total) by mouth every 4 (four) hours as needed for pain. Qty: 30 tablet, Refills: 0     levothyroxine (SYNTHROID) 125 MCG tablet Take 1 tablet (125 mcg total) by mouth daily. Qty: 30 tablet, Refills: 3    metoprolol (LOPRESSOR) 50 MG tablet Take 1 tablet (50 mg total) by mouth 2 (two) times daily. Qty: 60 tablet, Refills: 3      CONTINUE these medications which have NOT CHANGED   Details  ALPRAZolam (XANAX) 0.25 MG tablet Take 0.25 mg by mouth at bedtime as needed. For sleep.    aspirin EC 81 MG tablet Take 1 tablet (81 mg total) by mouth daily. Qty: 150 tablet, Refills: 2    cholecalciferol (VITAMIN D) 1000 UNITS tablet Take 1,000 Units by mouth daily.      esomeprazole (NEXIUM) 40 MG capsule Take 1 capsule (40 mg total) by mouth daily. Qty: 30 capsule, Refills: 5   Associated Diagnoses: GERD (gastroesophageal reflux disease)    fluticasone (FLONASE) 50 MCG/ACT nasal spray Place 2 sprays into the nose daily.      mesalamine (PENTASA) 250 MG CR capsule Take 1,000 mg by mouth 4 (four) times daily.       STOP taking these medications     atenolol (TENORMIN) 25 MG tablet Comments:  Reason for Stopping:  Outstanding Labs/Studies: Calcium low at 8.1 today, will be discharged on PO calcium supplementation with ENT follow-up in 1 week.  Duration of Discharge Encounter: 40 minutes including physician time.  Signed, Hurman Horn, PA-C 09/03/2011, 10:21 AM

## 2011-09-03 NOTE — Progress Notes (Signed)
After phone call with Dr. Jearld Fenton concerning calcium dosing.  Friday's calcium order was entered for the pt to receive 6 doses then automatically d/c'd after the 6th dose.

## 2011-09-03 NOTE — Progress Notes (Signed)
ANTICOAGULATION CONSULT NOTE - Follow Up Consult  Pharmacy Consult for Heparin Indication: atrial fibrillation (new onset)  Allergies  Allergen Reactions  . Codeine Nausea And Vomiting  . Demerol Other (See Comments)    Blood pressure  . Morphine And Related Nausea And Vomiting  . Penicillins Hives    Patient Measurements: Height: 5\' 6"  (167.6 cm) Weight: 206 lb 3.2 oz (93.532 kg) IBW/kg (Calculated) : 59.3  Heparin Dosing Weight: 80 kg  Vital Signs: Temp: 98.7 F (37.1 C) (12/02 2000) Temp src: Oral (12/02 2000) BP: 142/78 mmHg (12/02 2148) Pulse Rate: 108  (12/02 2148)  Labs:  Basename 09/02/11 2320 09/01/11 1936 09/01/11 1348 09/01/11 0401  HGB -- -- -- --  HCT -- -- -- --  PLT -- -- -- --  APTT -- -- -- --  LABPROT -- -- -- --  INR -- -- -- --  HEPARINUNFRC 0.19* -- -- --  CREATININE -- -- -- --  CKTOTAL -- 89 89 93  CKMB -- 6.2* 6.2* 5.3*  TROPONINI -- <0.30 <0.30 <0.30   Estimated Creatinine Clearance: 62.5 ml/min (by C-G formula based on Cr of 0.64).  Goal of Therapy:  Heparin level 0.3-0.7 units/ml  Assessment: 77 yo F with new onset Afib in the setting of surgery for thyroid mass removal.  Started on heparin and warfarin yesterday.  Heparin level is sub therapeutic on 1200 units/hr.   Plan:  Increase heparin rate to 1400 units/hr.  Recheck heparin level in 6 hours.  Rucker Pridgeon, Judie Bonus 09/03/2011,1:15 AM

## 2011-09-03 NOTE — Discharge Summary (Signed)
Patient examined and rounded on this am.  D/C today and F/U with primary cardiologist to see if further antiarrythmic Rx and coumadin needed

## 2011-09-03 NOTE — Telephone Encounter (Signed)
Called patient about an appointment and changing appointment.  She has questions about medications (coumadin) (heprin).  Please contact patient, she was recently discharged from hospital.

## 2011-09-06 ENCOUNTER — Encounter (HOSPITAL_COMMUNITY): Payer: Medicare Other

## 2011-09-07 ENCOUNTER — Ambulatory Visit: Payer: Medicare Other | Admitting: Cardiology

## 2011-09-07 ENCOUNTER — Ambulatory Visit (INDEPENDENT_AMBULATORY_CARE_PROVIDER_SITE_OTHER): Payer: Medicare Other | Admitting: Adult Health

## 2011-09-07 ENCOUNTER — Encounter: Payer: Self-pay | Admitting: Adult Health

## 2011-09-07 DIAGNOSIS — I2510 Atherosclerotic heart disease of native coronary artery without angina pectoris: Secondary | ICD-10-CM

## 2011-09-07 DIAGNOSIS — I4891 Unspecified atrial fibrillation: Secondary | ICD-10-CM

## 2011-09-07 DIAGNOSIS — I10 Essential (primary) hypertension: Secondary | ICD-10-CM

## 2011-09-07 DIAGNOSIS — E782 Mixed hyperlipidemia: Secondary | ICD-10-CM | POA: Insufficient documentation

## 2011-09-07 DIAGNOSIS — J449 Chronic obstructive pulmonary disease, unspecified: Secondary | ICD-10-CM

## 2011-09-07 DIAGNOSIS — E7800 Pure hypercholesterolemia, unspecified: Secondary | ICD-10-CM

## 2011-09-07 MED ORDER — HYDROCHLOROTHIAZIDE 25 MG PO TABS: ORAL_TABLET | ORAL | Status: DC

## 2011-09-07 NOTE — Assessment & Plan Note (Signed)
She apparently has been on Crestor in the past, but was taken off of this around the time of chemo and radiation for lung CA. She has not been checked recently and therefore, I will have labs drawn fasting lipids and LFT's.

## 2011-09-07 NOTE — Assessment & Plan Note (Addendum)
She has some LEE and shortness of breath, which apparently is chronic for her. However, I will add HCTZ 12.5 mg to her medication regimen to assist with this on a prn basis. Dyspnea will be an ongoing problem with her obesity and COPD. Wt loss is recommended. Checking for sleep apnea with the possibility of OSA or Hypoventilation syndrome associated with her weight would be reasonable.  I have spent 25 minutes with this patient and her multiple complaints.

## 2011-09-07 NOTE — Assessment & Plan Note (Signed)
Currently she remains in normal sinus rhythm. She may have had an adrenaline surge post-operatively without BB on board causing the episode. She was not found to be a candidate for coumadin.  She was placed on lopressor 50 mg BID on discharge and kept off of atenolol.  She is not happy about the BID dosing. I have offered to change it to Metoprolol XL or go back to atenolol.but she states that she just filled her Rx and does not want to change at this time. She is to see Dr. Diona Browner in one month. At that time it can be changed at his discretion.

## 2011-09-07 NOTE — Progress Notes (Signed)
HPI: [name] is a difficult 77 y/o patient of Dr. Ival Bible we are seeing post-operatively after thyroidectomy and removal of right thyroid mass.. She has a history of CAD with remote inferior MI. F/U nuclear stress test prior to surgery demonstrated inferior scar and peri-infarct ischemia. Normal EF of 50%.Hypertension, GERD, COPD.    She had a complicated post-op period which included transient atrial fib with RVR and chest discomfort. Cardiac enzymes were found to be negative and she  converted to NSR after institution of metoprolol. She was on atenolol pre-operatively but this apparently was not restarted during admission.She was started on heparin and coumadin but this was discontinued as she was found to be a poor candidate for its use secondary to gait unsteadiness and high risk for falls.   She comes today with multiple complaints, many questions, and very talkative about her illness.  It is difficult to focus her complaints to one topic.  Main cardiac complaint is weakness, LEE, and dyspnea.  Allergies  Allergen Reactions  . Codeine Nausea And Vomiting  . Demerol Other (See Comments)    Blood pressure  . Morphine And Related Nausea And Vomiting  . Penicillins Hives    Current Outpatient Prescriptions  Medication Sig Dispense Refill  . ALPRAZolam (XANAX) 0.25 MG tablet Take 0.25 mg by mouth at bedtime as needed. For sleep.      Marland Kitchen aspirin EC 81 MG tablet Take 1 tablet (81 mg total) by mouth daily.  150 tablet  2  . calcium gluconate 500 MG tablet Take 1 tablet (500 mg total) by mouth 3 (three) times daily.  30 tablet  0  . cholecalciferol (VITAMIN D) 1000 UNITS tablet Take 1,000 Units by mouth daily.        Marland Kitchen esomeprazole (NEXIUM) 40 MG capsule Take 1 capsule (40 mg total) by mouth daily.  30 capsule  5  . fluticasone (FLONASE) 50 MCG/ACT nasal spray Place 2 sprays into the nose daily.        Marland Kitchen HYDROmorphone (DILAUDID) 2 MG tablet Take 1 tablet (2 mg total) by mouth every 4  (four) hours as needed for pain.  30 tablet  0  . levothyroxine (SYNTHROID) 125 MCG tablet Take 1 tablet (125 mcg total) by mouth daily.  30 tablet  3  . mesalamine (PENTASA) 250 MG CR capsule Take 1,000 mg by mouth 4 (four) times daily.       . metoprolol (LOPRESSOR) 50 MG tablet Take 1 tablet (50 mg total) by mouth 2 (two) times daily.  60 tablet  3  . hydrochlorothiazide (HYDRODIURIL) 25 MG tablet Take 25 mg (1tablet) as needed for fluid retention  30 tablet  6    Past Medical History  Diagnosis Date  . Essential hypertension, benign   . 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 Crohn's disease   . Spinal stenosis   . PSVT (paroxysmal supraventricular tachycardia)   . History of TIAs   . GERD (gastroesophageal reflux disease)   . 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 Carotid artery disease     RICA 50-69% 12/09  . PONV (postoperative nausea and vomiting)     years ago had N/V, but not recently  . Myocardial infarction   . COPD (chronic obstructive pulmonary disease)   . Thyroid mass   . Incontinence of urine   . Anxiety   . Arthritis  Past Surgical History  Procedure Date  . Left thoracotomy with wedge resection 2008    Left lower lobe  . Right tonsillectomy 2005  . Left thyroidectomy 2006  . Left breast biopsy 2011  . US echocardiography   . Cholecystectomy   . Cardiovascular stress test   . Abdominal hysterectomy   . Thyroidectomy 08/30/2011    Procedure: THYROIDECTOMY;  Surgeon: Leonette Most;  Location: [facility] OR;  Service: ENT;  Laterality: Right;    YNW:GNFAOZ of systems complete and found to be negative unless listed above PHYSICAL EXAM BP 121/71  Pulse 69  Ht 5\' 6"  (1.676 m)  Wt 200 lb (90.719 kg)  BMI 32.28 kg/m2  SpO2 92%  General: Well developed, well nourished, morbidly obese in no acute distress Head: Eyes PERRLA, No xanthomas.   Normal cephalic and  atramatic  Lungs: Clear bilaterally to auscultation with mild crackles in the RLL. Heart: HRRR S1 S2, 1/6 systolic murmur.  Pulses are 2+ & equal.            No carotid bruit. No JVD.  No abdominal bruits. No femoral bruits. Abdomen: Bowel sounds are positive, abdomen soft and non-tender without masses or  Hernia's noted.Obese Msk:  Back normal, normal gait. Normal strength and tone for age. Extremities: No clubbing, cyanosis 1+ pitting edema.  Neuro: Alert and oriented X 3. Psych:  Flat affect, responds appropriately.  EKG:NSR with LAFB. Anterior T-wave flattening noted with notched T-wave in lead V6.  ASSESSMENT AND PLAN

## 2011-09-07 NOTE — Patient Instructions (Signed)
Your physician recommends that you schedule a follow-up appointment in: 1 month with Dr Diona Browner  Your physician recommends that you return for lab work in: Next week  Your physician has recommended you make the following change in your medication:  START Hydrochlorothiazide 25 mg as needed for fluid retention  Your physician recommends that you schedule a follow-up appointment in: Follow up with Dr Ouida Sills as soon as possible

## 2011-09-12 ENCOUNTER — Encounter (HOSPITAL_COMMUNITY): Payer: Medicare Other | Attending: Oncology

## 2011-09-12 DIAGNOSIS — C779 Secondary and unspecified malignant neoplasm of lymph node, unspecified: Secondary | ICD-10-CM | POA: Insufficient documentation

## 2011-09-12 DIAGNOSIS — C50919 Malignant neoplasm of unspecified site of unspecified female breast: Secondary | ICD-10-CM | POA: Insufficient documentation

## 2011-09-12 LAB — HEPATIC FUNCTION PANEL
ALT: 9 U/L (ref 0–35)
Alkaline Phosphatase: 73 U/L (ref 39–117)
Bilirubin, Direct: 0.1 mg/dL (ref 0.0–0.3)
Indirect Bilirubin: 0.4 mg/dL (ref 0.0–0.9)

## 2011-09-12 MED ORDER — HEPARIN SOD (PORK) LOCK FLUSH 100 UNIT/ML IV SOLN
INTRAVENOUS | Status: AC
  Filled 2011-09-12: qty 5

## 2011-09-12 NOTE — Progress Notes (Signed)
[name] presented for Portacath access and flush. Proper placement of portacath confirmed by CXR. Portacath located left chest wall accessed with  H 20 needle. Good blood return present. Portacath flushed with 20ml NS and 500U/33ml Heparin and needle removed intact. Procedure without incident. Patient tolerated procedure well.

## 2011-09-13 ENCOUNTER — Ambulatory Visit: Payer: Medicare Other | Admitting: Cardiology

## 2011-10-10 ENCOUNTER — Ambulatory Visit (INDEPENDENT_AMBULATORY_CARE_PROVIDER_SITE_OTHER): Payer: Medicare Other | Admitting: Cardiology

## 2011-10-10 ENCOUNTER — Encounter: Payer: Self-pay | Admitting: Cardiology

## 2011-10-10 VITALS — BP 146/80 | HR 65 | Resp 18 | Ht 65.0 in | Wt 200.0 lb

## 2011-10-10 DIAGNOSIS — I2510 Atherosclerotic heart disease of native coronary artery without angina pectoris: Secondary | ICD-10-CM

## 2011-10-10 DIAGNOSIS — I4891 Unspecified atrial fibrillation: Secondary | ICD-10-CM

## 2011-10-10 DIAGNOSIS — E782 Mixed hyperlipidemia: Secondary | ICD-10-CM

## 2011-10-10 MED ORDER — METOPROLOL TARTRATE 50 MG PO TABS: 50.0000 mg | ORAL_TABLET | Freq: Two times a day (BID) | ORAL | Status: DC

## 2011-10-10 MED ORDER — ASPIRIN EC 81 MG PO TBEC: 81.0000 mg | DELAYED_RELEASE_TABLET | Freq: Every day | ORAL | Status: AC

## 2011-10-10 NOTE — Assessment & Plan Note (Signed)
To start omega-3 supplements 1000 mg BID. She reports statin intolerances and does not want to consider these. Did not want to redraw labs for FLP.

## 2011-10-10 NOTE — Progress Notes (Signed)
Clinical Summary [name] is a 78 y.o.female presenting for followup. She was seen last month by Ms. Lawrence following thyroid surgery.  She had followup labwork that was to include FLP and LFT although only the LFT was drawn. We discussed this and she did not want to redraw the labs. She reports significant statin intolerances.  She reports no angina. Has several other chronic complaints.  We reviewed the history of atrial fibrillation perioperatively and current plan. She is in sinus rhythm on examination today.  We reviewed her stress test results again and discussed plan for medical therapy.   Allergies  Allergen Reactions  . Codeine Nausea And Vomiting  . Demerol Other (See Comments)    Blood pressure  . Morphine And Related Nausea And Vomiting  . Penicillins Hives    Current Outpatient Prescriptions  Medication Sig Dispense Refill  . ALPRAZolam (XANAX) 0.25 MG tablet Take 0.25 mg by mouth at bedtime as needed. For sleep.      . calcium carbonate (TUMS - DOSED IN MG ELEMENTAL CALCIUM) 500 MG chewable tablet Chew 1 tablet by mouth 2 (two) times daily.      . cholecalciferol (VITAMIN D) 1000 UNITS tablet Take 1,000 Units by mouth daily.        Marland Kitchen esomeprazole (NEXIUM) 40 MG capsule Take 1 capsule (40 mg total) by mouth daily.  30 capsule  5  . fluticasone (FLONASE) 50 MCG/ACT nasal spray Place 2 sprays into the nose daily.        . hydrochlorothiazide (HYDRODIURIL) 25 MG tablet Take 25 mg (1tablet) as needed for fluid retention  30 tablet  6  . levothyroxine (SYNTHROID) 125 MCG tablet Take 1 tablet (125 mcg total) by mouth daily.  30 tablet  3  . mesalamine (PENTASA) 250 MG CR capsule Take 1,000 mg by mouth 4 (four) times daily.       . metoprolol (LOPRESSOR) 50 MG tablet Take 1 tablet (50 mg total) by mouth 2 (two) times daily.  60 tablet  6  . OMEGA 3 1000 MG CAPS Take 1,000 mg by mouth 2 (two) times daily.      Marland Kitchen aspirin EC 81 MG tablet Take 1 tablet (81 mg total) by  mouth daily.  150 tablet  2    Past Medical History  Diagnosis Date  . Essential hypertension, benign   . 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 Crohn's disease   . Spinal stenosis   . PSVT (paroxysmal supraventricular tachycardia)   . History of TIAs   . GERD (gastroesophageal reflux disease)   . 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 Carotid artery disease     RICA 50-69% 12/09  . PONV (postoperative nausea and vomiting)     years ago had N/V, but not recently  . Myocardial infarction   . COPD (chronic obstructive pulmonary disease)   . Thyroid mass   . Incontinence of urine   . Anxiety   . Arthritis   . Atrial fibrillation     Postoperative - suboptimal Coumadin candidate  . CAD (coronary artery disease), native coronary artery     Social History [name] reports that she quit smoking about 17 years ago. Her smoking use included Cigarettes. She has never used smokeless tobacco. [name] reports that she does not drink alcohol.  Review of Systems Outlined above. Chronic arm and back pain. Occasional  palpitations. Stomach upset with ASA. Fatigued. Otherwise negative.  Physical Examination Filed Vitals:   10/10/11 0859  BP: 146/80  Pulse: 65  Resp: 18   Overweight woman in no acute distress.  HEENT: Conjuctivae and lids normal, oropharynx clear.  Neck: Supple, no JVD or obvious bruit.  Lungs: Clear, decreased breath sounds at bases.  Cardiac: Regular rate and rhythm, no S3.  Abdomen: Soft, NABS.  Skin: Warm and dry.  Extremities: Venous stasis.  Musculoskeletal: Mild kyphosis.  Neuropsychiatric: Alert and oriented x 3, calm.   Problem List and Plan

## 2011-10-10 NOTE — Assessment & Plan Note (Signed)
Noted perioperatively while off beta blocker. No anticoagulation planned. Continue ASA and metoprolol.

## 2011-10-10 NOTE — Patient Instructions (Signed)
Your physician has recommended you make the following change in your medication: Resume coated Aspirin 81 with food daily, and start taking Omega 3 1000 mg twice daily  Your physician recommends that you schedule a follow-up appointment in: 6 months

## 2011-10-10 NOTE — Assessment & Plan Note (Signed)
Based on Myoview results and managed medically at this time. Suggest taking ASA with food. Continue beta blocker.

## 2011-10-17 ENCOUNTER — Encounter (HOSPITAL_COMMUNITY): Payer: Self-pay | Admitting: Cardiovascular Disease

## 2011-10-24 ENCOUNTER — Encounter (HOSPITAL_COMMUNITY): Payer: Medicare Other | Attending: Oncology

## 2011-10-24 DIAGNOSIS — Z452 Encounter for adjustment and management of vascular access device: Secondary | ICD-10-CM

## 2011-10-24 DIAGNOSIS — C760 Malignant neoplasm of head, face and neck: Secondary | ICD-10-CM

## 2011-10-24 DIAGNOSIS — I779 Disorder of arteries and arterioles, unspecified: Secondary | ICD-10-CM | POA: Insufficient documentation

## 2011-10-24 MED ORDER — SODIUM CHLORIDE 0.9 % IV SOLN: INTRAVENOUS | Status: DC

## 2011-10-24 MED ORDER — HEPARIN SOD (PORK) LOCK FLUSH 100 UNIT/ML IV SOLN
INTRAVENOUS | Status: AC
  Administered 2011-10-24: 500 [IU] via INTRAVENOUS
  Filled 2011-10-24: qty 5

## 2011-10-24 MED ORDER — HEPARIN SOD (PORK) LOCK FLUSH 100 UNIT/ML IV SOLN
500.0000 [IU] | Freq: Once | INTRAVENOUS | Status: AC
  Administered 2011-10-24: 500 [IU] via INTRAVENOUS
  Filled 2011-10-24: qty 5

## 2011-10-24 MED ORDER — SODIUM CHLORIDE 0.9 % IJ SOLN
INTRAMUSCULAR | Status: AC
  Administered 2011-10-24: 10 mL
  Filled 2011-10-24: qty 10

## 2011-10-24 NOTE — Progress Notes (Signed)
Tolerated port flush well.  Good blood return. 

## 2011-11-07 ENCOUNTER — Other Ambulatory Visit (HOSPITAL_COMMUNITY): Payer: Self-pay | Admitting: Internal Medicine

## 2011-11-07 DIAGNOSIS — Z139 Encounter for screening, unspecified: Secondary | ICD-10-CM

## 2011-11-13 ENCOUNTER — Ambulatory Visit (HOSPITAL_COMMUNITY): Payer: Medicare Other

## 2011-11-13 ENCOUNTER — Telehealth: Payer: Self-pay | Admitting: Cardiology

## 2011-11-13 NOTE — Telephone Encounter (Signed)
**Note De-Identified  Obfuscation** While in [facility] on 09-03-11 pt. became tachycardic in the 150's and an EKG revealed atrial fibrillation so pt. was started on Lopressor IV with successful rate-control. Lopressor was switched to PO at 50 mg bid at discharge and Atenolol was d/c'd. Pt. C/o shaking, nausea, dizziness and states she cant breath at night. She wants to switch back to Atenolol or have decrease in Lopressor dose. Please advise./LV

## 2011-11-13 NOTE — Telephone Encounter (Signed)
PT HAS BEEN ON LOPRESSOR FOR ABOUT 2 MONTHS NOW AND BEFORE THAT SHE WAS ON ATENOLOL FOR 15 YRS.  SHE WOULD EITHER LIKE TO GO BACK TO ATENOLOL OR CUT LOPRESSOR DOSE IN HALF, SHE FEELS THAT THIS MEDICATION IS MAKING HER HAVE NAUSEA, DIZZINESS AND TO BE CONFUSED.

## 2011-11-14 MED ORDER — ATENOLOL 25 MG PO TABS: 25.0000 mg | ORAL_TABLET | Freq: Every day | ORAL | Status: DC

## 2011-11-14 NOTE — Telephone Encounter (Signed)
**Note De-Identified  Obfuscation** Pt. Advised, she verbalized understanding. RX sent to [facility] to fill and deliver./LV

## 2011-11-14 NOTE — Telephone Encounter (Signed)
She may switch back to Atenolol 25 mg daily and stop Lopressor.

## 2011-12-05 ENCOUNTER — Encounter (HOSPITAL_COMMUNITY): Payer: Medicare Other | Attending: Oncology

## 2011-12-05 DIAGNOSIS — Z452 Encounter for adjustment and management of vascular access device: Secondary | ICD-10-CM

## 2011-12-05 DIAGNOSIS — C760 Malignant neoplasm of head, face and neck: Secondary | ICD-10-CM

## 2011-12-05 DIAGNOSIS — I2510 Atherosclerotic heart disease of native coronary artery without angina pectoris: Secondary | ICD-10-CM

## 2011-12-05 MED ORDER — SODIUM CHLORIDE 0.9 % IJ SOLN
INTRAMUSCULAR | Status: AC
  Administered 2011-12-05: 10 mL via INTRAVENOUS
  Filled 2011-12-05: qty 10

## 2011-12-05 MED ORDER — HEPARIN SOD (PORK) LOCK FLUSH 100 UNIT/ML IV SOLN
500.0000 [IU] | Freq: Once | INTRAVENOUS | Status: AC
  Administered 2011-12-05: 500 [IU] via INTRAVENOUS
  Filled 2011-12-05: qty 5

## 2011-12-05 MED ORDER — HEPARIN SOD (PORK) LOCK FLUSH 100 UNIT/ML IV SOLN
INTRAVENOUS | Status: AC
  Administered 2011-12-05: 500 [IU] via INTRAVENOUS
  Filled 2011-12-05: qty 5

## 2011-12-05 MED ORDER — SODIUM CHLORIDE 0.9 % IJ SOLN
10.0000 mL | INTRAMUSCULAR | Status: DC | PRN
  Administered 2011-12-05: 10 mL via INTRAVENOUS
  Filled 2011-12-05: qty 10

## 2011-12-05 NOTE — Progress Notes (Signed)
Tolerated port flush well.  Good blood return. 

## 2011-12-11 ENCOUNTER — Telehealth: Payer: Self-pay | Admitting: Adult Health

## 2011-12-11 NOTE — Telephone Encounter (Signed)
Patient was prescribed Hydrochlorothiazide on an as needed basis.  Wants to know how often she can take it. / tg

## 2011-12-11 NOTE — Telephone Encounter (Signed)
**Note De-Identified  Obfuscation** Pt. advised to take dose of Hydrochlorothiazide 25 mg now and another in the morning if swelling remains. Also, she states she will call office later in the week if swelling is not improved./LV

## 2012-01-08 ENCOUNTER — Emergency Department (HOSPITAL_COMMUNITY): Payer: Medicare Other

## 2012-01-08 ENCOUNTER — Encounter (HOSPITAL_COMMUNITY): Payer: Self-pay | Admitting: *Deleted

## 2012-01-08 ENCOUNTER — Emergency Department (HOSPITAL_COMMUNITY)
Admission: EM | Admit: 2012-01-08 | Discharge: 2012-01-08 | Disposition: A | Discharge status: Home / Self Care | Payer: Medicare Other | Attending: Emergency Medicine | Admitting: Emergency Medicine

## 2012-01-08 DIAGNOSIS — Z8673 Personal history of transient ischemic attack (TIA), and cerebral infarction without residual deficits: Secondary | ICD-10-CM | POA: Insufficient documentation

## 2012-01-08 DIAGNOSIS — J4489 Other specified chronic obstructive pulmonary disease: Secondary | ICD-10-CM | POA: Insufficient documentation

## 2012-01-08 DIAGNOSIS — I2510 Atherosclerotic heart disease of native coronary artery without angina pectoris: Secondary | ICD-10-CM | POA: Insufficient documentation

## 2012-01-08 DIAGNOSIS — Z85118 Personal history of other malignant neoplasm of bronchus and lung: Secondary | ICD-10-CM | POA: Insufficient documentation

## 2012-01-08 DIAGNOSIS — R062 Wheezing: Secondary | ICD-10-CM | POA: Insufficient documentation

## 2012-01-08 DIAGNOSIS — R0989 Other specified symptoms and signs involving the circulatory and respiratory systems: Secondary | ICD-10-CM | POA: Insufficient documentation

## 2012-01-08 DIAGNOSIS — Z7982 Long term (current) use of aspirin: Secondary | ICD-10-CM | POA: Insufficient documentation

## 2012-01-08 DIAGNOSIS — I4891 Unspecified atrial fibrillation: Secondary | ICD-10-CM | POA: Insufficient documentation

## 2012-01-08 DIAGNOSIS — I252 Old myocardial infarction: Secondary | ICD-10-CM | POA: Insufficient documentation

## 2012-01-08 DIAGNOSIS — I779 Disorder of arteries and arterioles, unspecified: Secondary | ICD-10-CM | POA: Insufficient documentation

## 2012-01-08 DIAGNOSIS — I10 Essential (primary) hypertension: Secondary | ICD-10-CM | POA: Insufficient documentation

## 2012-01-08 DIAGNOSIS — J449 Chronic obstructive pulmonary disease, unspecified: Secondary | ICD-10-CM | POA: Insufficient documentation

## 2012-01-08 DIAGNOSIS — R609 Edema, unspecified: Secondary | ICD-10-CM | POA: Insufficient documentation

## 2012-01-08 DIAGNOSIS — M129 Arthropathy, unspecified: Secondary | ICD-10-CM | POA: Insufficient documentation

## 2012-01-08 DIAGNOSIS — Z79899 Other long term (current) drug therapy: Secondary | ICD-10-CM | POA: Insufficient documentation

## 2012-01-08 DIAGNOSIS — R0602 Shortness of breath: Secondary | ICD-10-CM | POA: Insufficient documentation

## 2012-01-08 DIAGNOSIS — J40 Bronchitis, not specified as acute or chronic: Secondary | ICD-10-CM

## 2012-01-08 DIAGNOSIS — K219 Gastro-esophageal reflux disease without esophagitis: Secondary | ICD-10-CM | POA: Insufficient documentation

## 2012-01-08 DIAGNOSIS — R0609 Other forms of dyspnea: Secondary | ICD-10-CM | POA: Insufficient documentation

## 2012-01-08 LAB — DIFFERENTIAL
Eosinophils Absolute: 0.1 10*3/uL (ref 0.0–0.7)
Eosinophils Relative: 1 % (ref 0–5)
Lymphocytes Relative: 15 % (ref 12–46)
Lymphs Abs: 1 10*3/uL (ref 0.7–4.0)
Monocytes Relative: 9 % (ref 3–12)
Neutrophils Relative %: 75 % (ref 43–77)

## 2012-01-08 LAB — URINE MICROSCOPIC-ADD ON

## 2012-01-08 LAB — URINALYSIS, ROUTINE W REFLEX MICROSCOPIC
Bilirubin Urine: NEGATIVE
Glucose, UA: NEGATIVE mg/dL
Specific Gravity, Urine: <1.005 — ABNORMAL LOW (ref 1.005–1.030)
Urobilinogen, UA: 0.2 mg/dL (ref 0.0–1.0)

## 2012-01-08 LAB — BASIC METABOLIC PANEL
BUN: 12 mg/dL (ref 6–23)
CO2: 33 mEq/L — ABNORMAL HIGH (ref 19–32)
GFR calc non Af Amer: 84 mL/min — ABNORMAL LOW (ref >90)
Glucose, Bld: 114 mg/dL — ABNORMAL HIGH (ref 70–99)
Potassium: 3.8 mEq/L (ref 3.5–5.1)

## 2012-01-08 LAB — CBC
Hemoglobin: 14.5 g/dL (ref 12.0–15.0)
MCH: 29.1 pg (ref 26.0–34.0)
MCV: 88.4 fL (ref 78.0–100.0)
RBC: 4.98 MIL/uL (ref 3.87–5.11)
WBC: 6.6 10*3/uL (ref 4.0–10.5)

## 2012-01-08 MED ORDER — DOXYCYCLINE HYCLATE 100 MG PO TABS
100.0000 mg | ORAL_TABLET | Freq: Once | ORAL | Status: AC
Start: 2012-01-08 — End: 2012-01-08
  Administered 2012-01-08: 100 mg via ORAL
  Filled 2012-01-08: qty 1

## 2012-01-08 MED ORDER — PREDNISONE 20 MG PO TABS
60.0000 mg | ORAL_TABLET | Freq: Once | ORAL | Status: AC
  Administered 2012-01-08: 60 mg via ORAL
  Filled 2012-01-08: qty 3

## 2012-01-08 MED ORDER — DOXYCYCLINE HYCLATE 100 MG PO CAPS: 100.0000 mg | ORAL_CAPSULE | Freq: Two times a day (BID) | ORAL | Status: AC

## 2012-01-08 MED ORDER — IPRATROPIUM BROMIDE 0.02 % IN SOLN
0.5000 mg | Freq: Once | RESPIRATORY_TRACT | Status: AC
  Administered 2012-01-08: 0.5 mg via RESPIRATORY_TRACT
  Filled 2012-01-08: qty 2.5

## 2012-01-08 MED ORDER — AEROCHAMBER Z-STAT PLUS/MEDIUM MISC: 1.0000 | Freq: Once | Status: DC

## 2012-01-08 MED ORDER — ALBUTEROL SULFATE HFA 108 (90 BASE) MCG/ACT IN AERS
2.0000 | INHALATION_SPRAY | RESPIRATORY_TRACT | Status: DC | PRN
  Administered 2012-01-08: 2 via RESPIRATORY_TRACT
  Filled 2012-01-08: qty 6.7

## 2012-01-08 MED ORDER — ALBUTEROL SULFATE (5 MG/ML) 0.5% IN NEBU
5.0000 mg | INHALATION_SOLUTION | Freq: Once | RESPIRATORY_TRACT | Status: AC
  Administered 2012-01-08: 5 mg via RESPIRATORY_TRACT
  Filled 2012-01-08: qty 1

## 2012-01-08 MED ORDER — PREDNISONE 10 MG PO TABS: ORAL_TABLET | ORAL | Status: DC

## 2012-01-08 MED ORDER — PREDNISONE (PAK) 10 MG PO TABS: 10.0000 mg | ORAL_TABLET | ORAL | Status: DC

## 2012-01-08 NOTE — Discharge Instructions (Signed)
Use the inhaler, 2 puffs every 4-5 hours as needed for cough or trouble breathing. Start the antibiotic prescription today.   Bronchitis Bronchitis is a problem of the air tubes leading to your lungs. This problem makes it hard for air to get in and out of the lungs. You may cough a lot because your air tubes are narrow. Going without care can cause lasting (chronic) bronchitis. HOME CARE   Drink enough fluids to keep your pee (urine) clear or pale yellow.   Use a cool mist humidifier.   Quit smoking if you smoke. If you keep smoking, the bronchitis might not get better.   Only take medicine as told by your doctor.  GET HELP RIGHT AWAY IF:   Coughing keeps you awake.   You start to wheeze.   You become more sick or weak.   You have a hard time breathing or get short of breath.   You cough up blood.   Coughing lasts more than 2 weeks.   You have a fever.   Your baby is older than 3 months with a rectal temperature of 102 F (38.9 C) or higher.   Your baby is 47 months old or younger with a rectal temperature of 100.4 F (38 C) or higher.  MAKE SURE YOU:  Understand these instructions.   Will watch your condition.   Will get help right away if you are not doing well or get worse.  Document Released: 03/05/2008 Document Revised: 09/06/2011 Document Reviewed: 08/19/2009 [facility] Patient Information 2012 [place], [place].

## 2012-01-08 NOTE — ED Provider Notes (Signed)
History     CSN: [id number]  Arrival date & time 01/08/12  0023   First MD Initiated Contact with Patient 01/08/12 [phone]      Chief Complaint  Patient presents with  . Shortness of Breath    (Consider location/radiation/quality/duration/timing/severity/associated sxs/prior treatment) HPI Comments: [name] is a 78 y.o. Female who has had shortness of breath that started yesterday. The shortness of breath is intermittent. It occurs mostly when she lies down. She has no associated fever, chills, nausea, vomiting, or chest pain. She has been producing sputum that is yellow in color. She has not had a similar problem in the past. She is using her usual medication without relief. She does not have a history of bronchitis or asthma.  Patient is a 78 y.o. female presenting with shortness of breath.  Shortness of Breath  Associated symptoms include shortness of breath.    Past Medical History  Diagnosis Date  . Essential hypertension, benign   . 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 Crohn's disease   . Spinal stenosis   . PSVT (paroxysmal supraventricular tachycardia)   . History of TIAs   . GERD (gastroesophageal reflux disease)   . 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 Carotid artery disease     RICA 50-69% 12/09  . PONV (postoperative nausea and vomiting)     years ago had N/V, but not recently  . Myocardial infarction   . COPD (chronic obstructive pulmonary disease)   . Thyroid mass   . Incontinence of urine   . Anxiety   . Arthritis   . Atrial fibrillation     Postoperative - suboptimal Coumadin candidate  . CAD (coronary artery disease), native coronary artery     Past Surgical History  Procedure Date  . Left thoracotomy with wedge resection 2008    Left lower lobe  . Right tonsillectomy 2005  . Left thyroidectomy 2006  . Left breast biopsy 2011  . US echocardiography     . Cholecystectomy   . Cardiovascular stress test   . Abdominal hysterectomy   . Thyroidectomy 08/30/2011    Procedure: THYROIDECTOMY;  Surgeon: Leonette Most;  Location: [facility] OR;  Service: ENT;  Laterality: Right;    Family History  Problem Relation Age of Onset  . Coronary artery disease Father     Died age 74  . Dementia Mother     History  Substance Use Topics  . Smoking status: Former Smoker    Types: Cigarettes    Quit date: 10/20/1993  . Smokeless tobacco: Never Used  . Alcohol Use: No    OB History    Grav Para Term Preterm Abortions TAB SAB Ect Mult Living                  Review of Systems  Respiratory: Positive for shortness of breath.   All other systems reviewed and are negative.    Allergies  Codeine; Demerol; Morphine and related; and Penicillins  Home Medications   Current Outpatient Rx  Name Route Sig Dispense Refill  . ALPRAZOLAM 0.25 MG PO TABS Oral Take 0.25 mg by mouth at bedtime as needed. For sleep.    . ASPIRIN EC 81 MG PO TBEC Oral Take 1 tablet (81 mg total) by mouth daily. 150 tablet 2  . ATENOLOL 25 MG PO TABS Oral Take 1 tablet (25 mg total)  by mouth daily. 30 tablet 6    To replace Lopressor/Please deliver to pt's addres ...  . CALCIUM CARBONATE ANTACID 500 MG PO CHEW Oral Chew 1 tablet by mouth 2 (two) times daily.    Marland Kitchen VITAMIN D 1000 UNITS PO TABS Oral Take 1,000 Units by mouth daily.      Marland Kitchen DOXYCYCLINE HYCLATE 100 MG PO CAPS Oral Take 1 capsule (100 mg total) by mouth 2 (two) times daily. 20 capsule 0  . ESOMEPRAZOLE MAGNESIUM 40 MG PO CPDR Oral Take 1 capsule (40 mg total) by mouth daily. 30 capsule 5  . FLUTICASONE PROPIONATE 50 MCG/ACT NA SUSP Nasal Place 2 sprays into the nose daily.      Marland Kitchen HYDROCHLOROTHIAZIDE 25 MG PO TABS  Take 25 mg (1tablet) as needed for fluid retention 30 tablet 6  . LEVOTHYROXINE SODIUM 125 MCG PO TABS Oral Take 1 tablet (125 mcg total) by mouth daily. 30 tablet 3  . MESALAMINE ER 250 MG PO CPCR Oral Take  1,000 mg by mouth 4 (four) times daily.     . OMEGA 3 1000 MG PO CAPS Oral Take 1,000 mg by mouth 2 (two) times daily.    Marland Kitchen PREDNISONE 10 MG PO TABS  Take q day 6,5,4,3,2,1 21 tablet 0    BP 138/67  Pulse 82  Temp(Src) 98.1 F (36.7 C) (Oral)  Resp 20  Ht 5\' 5"  (1.651 m)  Wt 200 lb (90.719 kg)  BMI 33.28 kg/m2  SpO2 94%  Physical Exam  Nursing note and vitals reviewed. Constitutional: She is oriented to person, place, and time. She appears well-developed and well-nourished.  HENT:  Head: Normocephalic and atraumatic.  Eyes: Conjunctivae and EOM are normal. Pupils are equal, round, and reactive to light.  Neck: Normal range of motion and phonation normal. Neck supple.  Cardiovascular: Normal rate, regular rhythm and intact distal pulses.   Pulmonary/Chest: She is in respiratory distress (mild). She has wheezes. She has no rales. She exhibits no tenderness.       Scattered rhonchi and wheezes. Decreased breath sounds bilaterally.  Abdominal: Soft. She exhibits no distension. There is no tenderness. There is no guarding.  Musculoskeletal: Normal range of motion. She exhibits edema (2+ bilateral lower legs).  Neurological: She is alert and oriented to person, place, and time. She has normal strength. She exhibits normal muscle tone.  Skin: Skin is warm and dry.  Psychiatric: She has a normal mood and affect. Her behavior is normal. Judgment and thought content normal.    ED Course  Procedures (including critical care time)    Date: 01/08/2012  Rate: 71  Rhythm: normal sinus rhythm  QRS Axis: normal  Intervals: normal  ST/T Wave abnormalities: normal  Conduction Disutrbances:none  Narrative Interpretation: AF resolved and rate slower  Old EKG Reviewed: changes noted Emergency department treatment: Nebulizer and prednisone and doxycycline  03:37- Pt is feeling better. Lungs have improved air mvt.   Labs Reviewed  CBC - Abnormal; Notable for the following:    RDW 15.6 (*)     All other components within normal limits  BASIC METABOLIC PANEL - Abnormal; Notable for the following:    Sodium 132 (*)    Chloride 92 (*)    CO2 33 (*)    Glucose, Bld 114 (*)    GFR calc non Af Amer 84 (*)    All other components within normal limits  URINALYSIS, ROUTINE W REFLEX MICROSCOPIC - Abnormal; Notable for the following:  Specific Gravity, Urine <1.005 (*)    Hgb urine dipstick TRACE (*)    All other components within normal limits  DIFFERENTIAL  URINE MICROSCOPIC-ADD ON   Dg Chest 2 View  01/08/2012  *RADIOLOGY REPORT*  Clinical Data: Shortness of breath. Lung cancer.  CHEST - 2 VIEW  Comparison: 12/24/2010.  Findings: Central line tip proximal superior vena cava.  Cardiomegaly.  Calcified tortuous aorta.  Postsurgical changes left mid to lower lung zone with scarring unchanged. This limits evaluation for subtle infiltrate at this level.  If recurrent tumor is of high clinical concern CT imaging recommended.  Central pulmonary vascular prominence without frank pulmonary edema.  No pneumothorax.  Compression deformity upper to mid thoracic vertebra unchanged.  IMPRESSION: Similar appearance to the prior exam as discussed above.  Original Report Authenticated By: Fuller Canada, M.D.     1. Bronchitis       MDM  Evaluation consistent with bronchitis. She is a nonsmoker. No evidence for pneumonia, serious bacterial infection or metabolic instability    Plan: Home Medications- Albuterol inhaler, Doxycycline; Home Treatments- Rest; Recommended follow up- PCP prn    Flint Melter, MD 01/08/12 [phone]

## 2012-01-08 NOTE — ED Notes (Signed)
C/o shortness of breath onset yesterday; reports worsening shortness of breath with lying flat. Alert, answers questions appropriately; spo2 96% on O2 2L/min per [place].

## 2012-01-08 NOTE — ED Notes (Signed)
Pt reports SOB began yesterday and continued through day. Pt denies having respiratory medications available. States she feels she is retaining fluid and reporting productive cough.

## 2012-01-08 NOTE — ED Notes (Signed)
Left in c/o son for transport home

## 2012-01-16 ENCOUNTER — Encounter (HOSPITAL_COMMUNITY): Payer: Medicare Other

## 2012-01-17 ENCOUNTER — Telehealth: Payer: Self-pay | Admitting: Cardiology

## 2012-01-17 NOTE — Telephone Encounter (Signed)
Patient states that she had to go ER on 01/04/12.  Was diagnosed with Bronchitis. Patient was wandering is she could be prescribed 02 for home use at night. Please return patient's phone call. / tg

## 2012-01-17 NOTE — Telephone Encounter (Signed)
**Note De-Identified  Obfuscation** Pt. Is advised to contact Dr. Ouida Sills, her PCP. She verbalized understanding./LV

## 2012-01-22 ENCOUNTER — Encounter (HOSPITAL_COMMUNITY): Payer: Medicare Other | Attending: Oncology

## 2012-01-22 ENCOUNTER — Encounter (HOSPITAL_COMMUNITY): Payer: Self-pay

## 2012-01-22 DIAGNOSIS — Z452 Encounter for adjustment and management of vascular access device: Secondary | ICD-10-CM

## 2012-01-22 DIAGNOSIS — Z95828 Presence of other vascular implants and grafts: Secondary | ICD-10-CM | POA: Insufficient documentation

## 2012-01-22 DIAGNOSIS — C760 Malignant neoplasm of head, face and neck: Secondary | ICD-10-CM

## 2012-01-22 DIAGNOSIS — Z9889 Other specified postprocedural states: Secondary | ICD-10-CM | POA: Insufficient documentation

## 2012-01-22 HISTORY — DX: Presence of other vascular implants and grafts: Z95.828

## 2012-01-22 MED ORDER — HEPARIN SOD (PORK) LOCK FLUSH 100 UNIT/ML IV SOLN
INTRAVENOUS | Status: AC
  Filled 2012-01-22: qty 5

## 2012-01-22 MED ORDER — SODIUM CHLORIDE 0.9 % IJ SOLN
INTRAMUSCULAR | Status: AC
Start: 2012-01-22 — End: 2012-01-22
  Filled 2012-01-22: qty 110

## 2012-01-22 MED ORDER — SODIUM CHLORIDE 0.9 % IJ SOLN
10.0000 mL | INTRAMUSCULAR | Status: DC | PRN
  Administered 2012-01-22: 10 mL via INTRAVENOUS
  Filled 2012-01-22: qty 10

## 2012-01-22 MED ORDER — HEPARIN SOD (PORK) LOCK FLUSH 100 UNIT/ML IV SOLN
500.0000 [IU] | Freq: Once | INTRAVENOUS | Status: AC
  Administered 2012-01-22: 500 [IU] via INTRAVENOUS
  Filled 2012-01-22: qty 5

## 2012-02-14 ENCOUNTER — Other Ambulatory Visit (HOSPITAL_COMMUNITY): Payer: Self-pay | Admitting: Radiology

## 2012-02-14 DIAGNOSIS — C3490 Malignant neoplasm of unspecified part of unspecified bronchus or lung: Secondary | ICD-10-CM

## 2012-02-20 ENCOUNTER — Ambulatory Visit (HOSPITAL_COMMUNITY)
Admission: RE | Admit: 2012-02-20 | Discharge: 2012-02-20 | Disposition: A | Discharge status: Home / Self Care | Payer: Medicare Other | Source: Ambulatory Visit | Attending: Radiology | Admitting: Radiology

## 2012-02-20 DIAGNOSIS — C3490 Malignant neoplasm of unspecified part of unspecified bronchus or lung: Secondary | ICD-10-CM | POA: Insufficient documentation

## 2012-02-20 DIAGNOSIS — Z8719 Personal history of other diseases of the digestive system: Secondary | ICD-10-CM | POA: Insufficient documentation

## 2012-02-20 DIAGNOSIS — R933 Abnormal findings on diagnostic imaging of other parts of digestive tract: Secondary | ICD-10-CM | POA: Insufficient documentation

## 2012-03-04 ENCOUNTER — Encounter (HOSPITAL_COMMUNITY): Payer: Medicare Other | Attending: Oncology

## 2012-03-04 DIAGNOSIS — Z452 Encounter for adjustment and management of vascular access device: Secondary | ICD-10-CM

## 2012-03-04 DIAGNOSIS — Z95828 Presence of other vascular implants and grafts: Secondary | ICD-10-CM

## 2012-03-04 DIAGNOSIS — Z9889 Other specified postprocedural states: Secondary | ICD-10-CM | POA: Insufficient documentation

## 2012-03-04 DIAGNOSIS — C760 Malignant neoplasm of head, face and neck: Secondary | ICD-10-CM

## 2012-03-04 MED ORDER — HEPARIN SOD (PORK) LOCK FLUSH 100 UNIT/ML IV SOLN
500.0000 [IU] | Freq: Once | INTRAVENOUS | Status: AC
  Administered 2012-03-04: 500 [IU] via INTRAVENOUS
  Filled 2012-03-04: qty 5

## 2012-03-04 MED ORDER — HEPARIN SOD (PORK) LOCK FLUSH 100 UNIT/ML IV SOLN
INTRAVENOUS | Status: AC
  Filled 2012-03-04: qty 5

## 2012-03-04 MED ORDER — SODIUM CHLORIDE 0.9 % IJ SOLN
INTRAMUSCULAR | Status: AC
  Filled 2012-03-04: qty 10

## 2012-03-04 MED ORDER — SODIUM CHLORIDE 0.9 % IJ SOLN
10.0000 mL | INTRAMUSCULAR | Status: DC | PRN
  Administered 2012-03-04: 10 mL via INTRAVENOUS
  Filled 2012-03-04: qty 10

## 2012-03-04 NOTE — Progress Notes (Signed)
[name] presented for Portacath access and flush. Proper placement of portacath confirmed by CXR. Portacath located rt  chest wall accessed with  H 20 needle. Good blood return present. Portacath flushed with 20ml NS and 500U/5ml Heparin and needle removed intact. Procedure without incident. Patient tolerated procedure well.   

## 2012-04-15 ENCOUNTER — Encounter (HOSPITAL_COMMUNITY): Payer: Medicare Other | Attending: Oncology

## 2012-04-15 DIAGNOSIS — I779 Disorder of arteries and arterioles, unspecified: Secondary | ICD-10-CM | POA: Insufficient documentation

## 2012-04-15 DIAGNOSIS — C760 Malignant neoplasm of head, face and neck: Secondary | ICD-10-CM

## 2012-04-15 DIAGNOSIS — Z452 Encounter for adjustment and management of vascular access device: Secondary | ICD-10-CM

## 2012-04-15 MED ORDER — HEPARIN SOD (PORK) LOCK FLUSH 100 UNIT/ML IV SOLN
500.0000 [IU] | Freq: Once | INTRAVENOUS | Status: AC
  Administered 2012-04-15: 500 [IU] via INTRAVENOUS
  Filled 2012-04-15: qty 5

## 2012-04-15 MED ORDER — SODIUM CHLORIDE 0.9 % IJ SOLN
INTRAMUSCULAR | Status: AC
  Filled 2012-04-15: qty 10

## 2012-04-15 MED ORDER — HEPARIN SOD (PORK) LOCK FLUSH 100 UNIT/ML IV SOLN
INTRAVENOUS | Status: AC
  Filled 2012-04-15: qty 5

## 2012-04-15 MED ORDER — SODIUM CHLORIDE 0.9 % IJ SOLN
10.0000 mL | INTRAMUSCULAR | Status: DC | PRN
  Administered 2012-04-15: 10 mL via INTRAVENOUS
  Filled 2012-04-15: qty 10

## 2012-04-15 NOTE — Progress Notes (Signed)
Tolerated port flush well. 

## 2012-04-16 ENCOUNTER — Encounter (INDEPENDENT_AMBULATORY_CARE_PROVIDER_SITE_OTHER): Payer: Self-pay | Admitting: *Deleted

## 2012-04-29 ENCOUNTER — Encounter (INDEPENDENT_AMBULATORY_CARE_PROVIDER_SITE_OTHER): Payer: Self-pay | Admitting: Internal Medicine

## 2012-04-29 ENCOUNTER — Ambulatory Visit (INDEPENDENT_AMBULATORY_CARE_PROVIDER_SITE_OTHER): Payer: Medicare Other | Admitting: Internal Medicine

## 2012-04-29 VITALS — BP 136/56 | HR 60 | Temp 97.8°F | Ht 63.0 in | Wt 200.8 lb

## 2012-04-29 DIAGNOSIS — R1310 Dysphagia, unspecified: Secondary | ICD-10-CM | POA: Insufficient documentation

## 2012-04-29 NOTE — Progress Notes (Signed)
Subjective:     Patient ID: [name], female   DOB: 1927/08/28, 78 y.o.   MRN: [id number]  HPI [name] is a 78 yr old female presenting today with a quivering in her abdomen. Sometimes she has lower abdomen pain.  She usually has a BM about once every 2 days.  She c/o solid dysphagia. She has no problems eating soft foods.  It feels like foods are lodging in her lower esophagus. Chicken breast, cornbread and stew beef will lodge in her esophagus. Symptoms since having her thyroid removed in November.  EGD 10/2008: Non critical Schatzki ring, Erosive reflux esophagitis. Small hiatal hernia.  Review of Systems see hpi Current Outpatient Prescriptions  Medication Sig Dispense Refill  . ALPRAZolam (XANAX) 0.25 MG tablet Take 0.25 mg by mouth at bedtime as needed. For sleep.      Marland Kitchen aspirin EC 81 MG tablet Take 1 tablet (81 mg total) by mouth daily.  150 tablet  2  . atenolol (TENORMIN) 25 MG tablet Take 1 tablet (25 mg total) by mouth daily.  30 tablet  6  . calcium carbonate (TUMS - DOSED IN MG ELEMENTAL CALCIUM) 500 MG chewable tablet Chew 1 tablet by mouth 2 (two) times daily.      . cholecalciferol (VITAMIN D) 1000 UNITS tablet Take 1,000 Units by mouth daily.        Marland Kitchen esomeprazole (NEXIUM) 40 MG capsule Take 1 capsule (40 mg total) by mouth daily.  30 capsule  5  . fluticasone (FLONASE) 50 MCG/ACT nasal spray Place 2 sprays into the nose daily.        . hydrochlorothiazide (HYDRODIURIL) 25 MG tablet Take 25 mg (1tablet) as needed for fluid retention  30 tablet  6  . levothyroxine (SYNTHROID, LEVOTHROID) 125 MCG tablet Take 88 mcg by mouth daily.      . mesalamine (PENTASA) 250 MG CR capsule Take 1,000 mg by mouth 4 (four) times daily.       . OMEGA 3 1000 MG CAPS Take 1,000 mg by mouth 2 (two) times daily.      Marland Kitchen DISCONTD: levothyroxine (SYNTHROID) 125 MCG tablet Take 1 tablet (125 mcg total) by mouth daily.  30 tablet  3   Past Medical History  Diagnosis Date  . Essential  hypertension, benign   . 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 Crohn's disease   . Spinal stenosis   . PSVT (paroxysmal supraventricular tachycardia)   . History of TIAs   . GERD (gastroesophageal reflux disease)   . 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 Carotid artery disease     RICA 50-69% 12/09  . PONV (postoperative nausea and vomiting)     years ago had N/V, but not recently  . Myocardial infarction   . COPD (chronic obstructive pulmonary disease)   . Thyroid mass   . Incontinence of urine   . Anxiety   . Arthritis   . Atrial fibrillation     Postoperative - suboptimal Coumadin candidate  . CAD (coronary artery disease), native coronary artery   . Port-a-cath in place 01/22/2012   Past Surgical History  Procedure Date  . Left thoracotomy with wedge resection 2008    Left lower lobe  . Right tonsillectomy 2005  . Left thyroidectomy 2006  . Left breast biopsy 2011  . US echocardiography   . Cholecystectomy   . Cardiovascular stress  test   . Abdominal hysterectomy   . Thyroidectomy 08/30/2011    Procedure: THYROIDECTOMY;  Surgeon: Leonette Most;  Location: [facility] OR;  Service: ENT;  Laterality: Right;   History   Social History  . Marital Status: Widowed    Spouse Name: N/A    Number of Children: N/A  . Years of Education: N/A   Occupational History  . Not on file.   Social History Main Topics  . Smoking status: Former Smoker    Types: Cigarettes    Quit date: 10/20/1993  . Smokeless tobacco: Never Used  . Alcohol Use: No  . Drug Use: No  . Sexually Active: Not on file   Other Topics Concern  . Not on file   Social History Narrative  . No narrative on file   Family Status  Relation Status Death Age  . Father Deceased     MI age 28  . Mother Deceased     age 74   History   Social History  . Marital Status: Widowed    Spouse Name: N/A    Number of Children:  N/A  . Years of Education: N/A   Occupational History  . Not on file.   Social History Main Topics  . Smoking status: Former Smoker    Types: Cigarettes    Quit date: 10/20/1993  . Smokeless tobacco: Never Used  . Alcohol Use: No  . Drug Use: No  . Sexually Active: Not on file   Other Topics Concern  . Not on file   Social History Narrative  . No narrative on file   Allergies  Allergen Reactions  . Codeine Nausea And Vomiting  . Demerol Other (See Comments)    Blood pressure  . Morphine And Related Nausea And Vomiting  . Penicillins Hives        Objective:   Physical Exam Filed Vitals:   04/29/12 0933  Height: 5\' 3"  (1.6 m)  Weight: 200 lb 12.8 oz (91.082 kg)   Alert and oriented. Skin warm and dry. Oral mucosa is moist.   . Sclera anicteric, conjunctivae is pink. Thyroid not enlarged. No cervical lymphadenopathy. Lungs clear. Heartrate irregularAbdomen is soft. Bowel sounds are positive. No hepatomegaly. No abdominal masses felt. No tenderness.  No edema to lower extremities.       Assessment:    Solid foods dysphagia. Esophageal ring needs to be ruled out.    Plan:    EGD/ED.The risks and benefits such as perforation, bleeding, and infection were reviewed with the patient and is agreeable.

## 2012-04-29 NOTE — Patient Instructions (Addendum)
EGD/ED. The risks and benefits such as perforation, bleeding, and infection were reviewed with the patient and is agreeable. 

## 2012-05-01 ENCOUNTER — Other Ambulatory Visit (INDEPENDENT_AMBULATORY_CARE_PROVIDER_SITE_OTHER): Payer: Self-pay | Admitting: *Deleted

## 2012-05-01 ENCOUNTER — Encounter (INDEPENDENT_AMBULATORY_CARE_PROVIDER_SITE_OTHER): Payer: Self-pay | Admitting: *Deleted

## 2012-05-01 DIAGNOSIS — R1310 Dysphagia, unspecified: Secondary | ICD-10-CM

## 2012-05-02 ENCOUNTER — Encounter (HOSPITAL_COMMUNITY): Payer: Self-pay | Admitting: Pharmacy Technician

## 2012-05-07 ENCOUNTER — Ambulatory Visit (INDEPENDENT_AMBULATORY_CARE_PROVIDER_SITE_OTHER): Payer: Medicare Other | Admitting: Cardiology

## 2012-05-07 ENCOUNTER — Encounter: Payer: Self-pay | Admitting: Cardiology

## 2012-05-07 VITALS — BP 162/70 | HR 69 | Wt 197.0 lb

## 2012-05-07 DIAGNOSIS — I2510 Atherosclerotic heart disease of native coronary artery without angina pectoris: Secondary | ICD-10-CM

## 2012-05-07 DIAGNOSIS — R600 Localized edema: Secondary | ICD-10-CM | POA: Insufficient documentation

## 2012-05-07 DIAGNOSIS — R609 Edema, unspecified: Secondary | ICD-10-CM

## 2012-05-07 DIAGNOSIS — I4891 Unspecified atrial fibrillation: Secondary | ICD-10-CM

## 2012-05-07 DIAGNOSIS — R0602 Shortness of breath: Secondary | ICD-10-CM

## 2012-05-07 MED ORDER — ISOSORBIDE MONONITRATE ER 30 MG PO TB24: 30.0000 mg | ORAL_TABLET | Freq: Every day | ORAL | Status: DC

## 2012-05-07 MED ORDER — FUROSEMIDE 20 MG PO TABS: 20.0000 mg | ORAL_TABLET | ORAL | Status: DC | PRN

## 2012-05-07 MED ORDER — POTASSIUM CHLORIDE ER 10 MEQ PO TBCR: 10.0000 meq | EXTENDED_RELEASE_TABLET | ORAL | Status: DC | PRN

## 2012-05-07 NOTE — Patient Instructions (Addendum)
Your physician recommends that you schedule a follow-up appointment in: 3 - 4 weeks  Your physician recommends that you return for lab work in: Prior to your follow up visit.  You will receive a reminder letter.  Your physician recommends that you schedule a follow-up appointment in:  1 - STOP HCTZ 2 - START Lasix (furosemide) 20 mg as needed for edema 3 - START Potassium 10 meq along with lasix 4 - START IMDUR 30 mg daily  Your physician has requested that you have an echocardiogram. Echocardiography is a painless test that uses sound waves to create images of your heart. It provides your doctor with information about the size and shape of your heart and how well your heart's chambers and valves are working. This procedure takes approximately one hour. There are no restrictions for this procedure.

## 2012-05-07 NOTE — Assessment & Plan Note (Signed)
History difficult to follow, although she does seem to be describing angina. She had an abnormal Myoview from last year demonstrating evidence of inferior scar and peri-infarct ischemia we have been managing her medically in light of her medical complexity. She is on aspirin and beta blocker therapy. We will add Imdur to see if this improves her angina. May need additional blood pressure lowering agents, although she is very hesitant in general to take medications. Office follow up is arranged.

## 2012-05-07 NOTE — Assessment & Plan Note (Signed)
Will discontinue HCTZ and change to Lasix 20 mg p.r.n. to daily with potassium 10 mEq. Followup BMET for next visit. An echocardiogram is also being obtained to followup on LV function, mainly to exclude any developing cardiomyopathy.

## 2012-05-07 NOTE — Assessment & Plan Note (Signed)
This has not been recurrent as yet following perioperative episode.

## 2012-05-07 NOTE — Progress Notes (Signed)
Clinical Summary [name] is a medically complex 78 y.o.female presenting for followup. She was seen in January. She presents today with several complaints. Difficult to stay on track as she has fairly tangential history. She does seem to be having angina symptoms with activity, describes nausea and some feeling of chest discomfort when she pushes herself. She also has been having lower extremity edema, states it is worse in general, although she has chronic swelling of her right leg with venous stasis. She states she feels too weak to take HCTZ regularly.  Labwork from April showed potassium 3.8, BUN 12, creatinine 0.5. Her weight is down 3 pounds from the last visit. I reviewed her ECG from [facility] which showed sinus rhythm.  Chest/abdminal CT in May showed no obvious metastatic disease.  She tells that she will be having an endoscopy soon with Dr. Karilyn Cota.  Today we reviewed her cardiac testing from last year, discussed diagnosis of CAD and angina, also reviewed some medication changes and we will consider.   Allergies  Allergen Reactions  . Codeine Nausea And Vomiting  . Demerol Other (See Comments)    Blood pressure  . Morphine And Related Nausea And Vomiting  . Penicillins Hives and Swelling    Current Outpatient Prescriptions  Medication Sig Dispense Refill  . ALPRAZolam (XANAX) 0.25 MG tablet Take 0.25 mg by mouth at bedtime as needed. For sleep.      Marland Kitchen aspirin EC 81 MG tablet Take 1 tablet (81 mg total) by mouth daily.  150 tablet  2  . atenolol (TENORMIN) 25 MG tablet Take 1 tablet (25 mg total) by mouth daily.  30 tablet  6  . calcium carbonate (TUMS - DOSED IN MG ELEMENTAL CALCIUM) 500 MG chewable tablet Chew 1 tablet by mouth 2 (two) times daily.      Marland Kitchen esomeprazole (NEXIUM) 40 MG capsule Take 1 capsule (40 mg total) by mouth daily.  30 capsule  5  . fluticasone (FLONASE) 50 MCG/ACT nasal spray Place 2 sprays into the nose daily.        Marland Kitchen ipratropium (ATROVENT HFA) 17  MCG/ACT inhaler Inhale 2 puffs into the lungs every 6 (six) hours. Shortness of breath      . levothyroxine (SYNTHROID, LEVOTHROID) 88 MCG tablet Take 88 mcg by mouth daily.      Marland Kitchen lidocaine (LIDODERM) 5 % Place 1 patch onto the skin daily as needed. Remove & Discard patch within 12 hours or as directed by MD. Pain.      . mesalamine (PENTASA) 250 MG CR capsule Take 1,000 mg by mouth 4 (four) times daily.       . OMEGA 3 1000 MG CAPS Take 1,000 mg by mouth 2 (two) times daily.      . furosemide (LASIX) 20 MG tablet Take 1 tablet (20 mg total) by mouth as needed.  30 tablet  6  . isosorbide mononitrate (IMDUR) 30 MG 24 hr tablet Take 1 tablet (30 mg total) by mouth daily.  30 tablet  12  . potassium chloride (K-DUR) 10 MEQ tablet Take 1 tablet (10 mEq total) by mouth as needed.  30 tablet  6    Past Medical History  Diagnosis Date  . Essential hypertension, benign   . 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 Crohn's disease   . Spinal stenosis   . PSVT (paroxysmal supraventricular tachycardia)   . History of TIAs   .  GERD (gastroesophageal reflux disease)   . 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 Carotid artery disease     RICA 50-69% 12/09  . PONV (postoperative nausea and vomiting)     years ago had N/V, but not recently  . Myocardial infarction   . COPD (chronic obstructive pulmonary disease)   . Thyroid mass   . Incontinence of urine   . Anxiety   . Arthritis   . Atrial fibrillation     Postoperative - suboptimal Coumadin candidate  . CAD (coronary artery disease), native coronary artery   . Port-a-cath in place 01/22/2012    Social History [name] reports that she quit smoking about 18 years ago. Her smoking use included Cigarettes. She has never used smokeless tobacco. [name] reports that she does not drink alcohol.  Review of Systems Negative except as outlined  above.  Physical Examination Filed Vitals:   05/07/12 1337  BP: 162/70  Pulse: 69   Overweight woman in no acute distress.  HEENT: Conjuctivae and lids normal, oropharynx clear.  Neck: Supple, no JVD or obvious bruit.  Lungs: Clear, decreased breath sounds at bases.  Cardiac: Regular rate and rhythm, soft systolic murmur, no S3.  Abdomen: Soft, NABS.  Skin: Warm and dry.  Extremities: Venous stasis and spider veins, right worse than left. 2+ edema on the left. Musculoskeletal: Mild kyphosis.  Neuropsychiatric: Alert and oriented x 3, tangential historian.    Problem List and Plan   Coronary atherosclerosis of native coronary artery History difficult to follow, although she does seem to be describing angina. She had an abnormal Myoview from last year demonstrating evidence of inferior scar and peri-infarct ischemia we have been managing her medically in light of her medical complexity. She is on aspirin and beta blocker therapy. We will add Imdur to see if this improves her angina. May need additional blood pressure lowering agents, although she is very hesitant in general to take medications. Office follow up is arranged.  Atrial fibrillation This has not been recurrent as yet following perioperative episode.  Edema Will discontinue HCTZ and change to Lasix 20 mg p.r.n. to daily with potassium 10 mEq. Followup BMET for next visit. An echocardiogram is also being obtained to followup on LV function, mainly to exclude any developing cardiomyopathy.    [name], M.D., F.A.C.C.

## 2012-05-08 ENCOUNTER — Encounter: Payer: Self-pay | Admitting: *Deleted

## 2012-05-09 ENCOUNTER — Other Ambulatory Visit (HOSPITAL_COMMUNITY): Payer: Medicare Other

## 2012-05-13 ENCOUNTER — Ambulatory Visit (HOSPITAL_COMMUNITY)
Admission: RE | Admit: 2012-05-13 | Discharge: 2012-05-13 | Disposition: A | Discharge status: Home / Self Care | Payer: Medicare Other | Source: Ambulatory Visit | Attending: Cardiology | Admitting: Cardiology

## 2012-05-13 DIAGNOSIS — I2510 Atherosclerotic heart disease of native coronary artery without angina pectoris: Secondary | ICD-10-CM | POA: Insufficient documentation

## 2012-05-13 DIAGNOSIS — I517 Cardiomegaly: Secondary | ICD-10-CM

## 2012-05-13 DIAGNOSIS — R0602 Shortness of breath: Secondary | ICD-10-CM | POA: Insufficient documentation

## 2012-05-13 NOTE — Progress Notes (Signed)
*  PRELIMINARY RESULTS* Echocardiogram 2D Echocardiogram has been performed.  Caswell Corwin 05/13/2012, 12:57 PM

## 2012-05-16 ENCOUNTER — Ambulatory Visit (HOSPITAL_COMMUNITY)
Admission: RE | Admit: 2012-05-16 | Discharge: 2012-05-16 | Disposition: A | Discharge status: Home / Self Care | Payer: Medicare Other | Source: Ambulatory Visit | Attending: Internal Medicine | Admitting: Internal Medicine

## 2012-05-16 ENCOUNTER — Encounter (HOSPITAL_COMMUNITY)
Admission: RE | Disposition: A | Discharge status: Home / Self Care | Payer: Self-pay | Source: Ambulatory Visit | Attending: Internal Medicine

## 2012-05-16 ENCOUNTER — Encounter (HOSPITAL_COMMUNITY): Payer: Self-pay | Admitting: *Deleted

## 2012-05-16 DIAGNOSIS — K219 Gastro-esophageal reflux disease without esophagitis: Secondary | ICD-10-CM

## 2012-05-16 DIAGNOSIS — J449 Chronic obstructive pulmonary disease, unspecified: Secondary | ICD-10-CM | POA: Insufficient documentation

## 2012-05-16 DIAGNOSIS — K449 Diaphragmatic hernia without obstruction or gangrene: Secondary | ICD-10-CM | POA: Insufficient documentation

## 2012-05-16 DIAGNOSIS — Z79899 Other long term (current) drug therapy: Secondary | ICD-10-CM | POA: Insufficient documentation

## 2012-05-16 DIAGNOSIS — K209 Esophagitis, unspecified: Secondary | ICD-10-CM

## 2012-05-16 DIAGNOSIS — R1310 Dysphagia, unspecified: Secondary | ICD-10-CM | POA: Insufficient documentation

## 2012-05-16 DIAGNOSIS — I10 Essential (primary) hypertension: Secondary | ICD-10-CM | POA: Insufficient documentation

## 2012-05-16 DIAGNOSIS — J4489 Other specified chronic obstructive pulmonary disease: Secondary | ICD-10-CM | POA: Insufficient documentation

## 2012-05-16 SURGERY — ESOPHAGOGASTRODUODENOSCOPY (EGD) WITH ESOPHAGEAL DILATION
Anesthesia: Moderate Sedation

## 2012-05-16 MED ORDER — STERILE WATER FOR IRRIGATION IR SOLN
Status: DC | PRN
  Administered 2012-05-16: 10:00:00

## 2012-05-16 MED ORDER — MIDAZOLAM HCL 5 MG/5ML IJ SOLN
INTRAMUSCULAR | Status: AC
  Filled 2012-05-16: qty 10

## 2012-05-16 MED ORDER — BUTAMBEN-TETRACAINE-BENZOCAINE 2-2-14 % EX AERO
INHALATION_SPRAY | CUTANEOUS | Status: DC | PRN
  Administered 2012-05-16: 2 via TOPICAL

## 2012-05-16 MED ORDER — PANTOPRAZOLE SODIUM 40 MG PO TBEC: 40.0000 mg | DELAYED_RELEASE_TABLET | Freq: Every day | ORAL | Status: DC

## 2012-05-16 MED ORDER — SODIUM CHLORIDE 0.45 % IV SOLN
Freq: Once | INTRAVENOUS | Status: AC
  Administered 2012-05-16: 20 mL/h via INTRAVENOUS

## 2012-05-16 MED ORDER — FENTANYL CITRATE 0.05 MG/ML IJ SOLN
INTRAMUSCULAR | Status: AC
  Filled 2012-05-16: qty 2

## 2012-05-16 MED ORDER — MIDAZOLAM HCL 5 MG/5ML IJ SOLN
INTRAMUSCULAR | Status: DC | PRN
  Administered 2012-05-16 (×2): 2 mg via INTRAVENOUS

## 2012-05-16 MED ORDER — FENTANYL CITRATE 0.05 MG/ML IJ SOLN
INTRAMUSCULAR | Status: DC | PRN
  Administered 2012-05-16: 25 ug via INTRAVENOUS

## 2012-05-16 NOTE — Op Note (Signed)
EGD PROCEDURE REPORT  PATIENT:  [name]  MR#:  [id number] Birthdate:  1927-09-11, 78 y.o., female Endoscopist:  Dr. Malissa Hippo, MD Referred By:  Dr. Carylon Perches, MD Procedure Date: 05/16/2012  Procedure:   EGD with ED.  Indications:  Patient is 78 year old Caucasian female with multiple medical problems including chronic GERD who presents with solid food dysphagia. She has history of erosive esophagitis and Schatzki's ring. Last EGD/ED was in January 2010.            Informed Consent:  The risks, benefits, alternatives & imponderables which include, but are not limited to, bleeding, infection, perforation, drug reaction and potential missed lesion have been reviewed.  The potential for biopsy, lesion removal, esophageal dilation, etc. have also been discussed.  Questions have been answered.  All parties agreeable.  Please see history & physical in medical record for more information.  Medications:  Demerol 25mg  IV Versed 4 mg IV Cetacaine spray topically for oropharyngeal anesthesia  Description of procedure:  The endoscope was introduced through the mouth and advanced to the second portion of the duodenum without difficulty or limitations. The mucosal surfaces were surveyed very carefully during advancement of the scope and upon withdrawal.  Findings:  Esophagus:  Mucosa of the esophagus was normal. There was edema and erythema at GE junction. No ring or stricture was identified. GEJ:  39 cm Hiatus:  41 cm Stomach:  Stomach was empty and distended very well with insufflation. Folds in the proximal stomach were normal. Examination mucosa at gastric body, antrum, pyloric channel, angularis, fundus and cardia was normal. Duodenum:  Bulbar mucosa was normal. Scope was passed into the second part of the duodenum there mucosa and folds are normal.  Therapeutic/Diagnostic Maneuvers Performed:  Esophagus was dilated by passing 56 [place] Maloney dilator to full insertion. Esophageal  mucosa was reexamined post dilation and no mucosal disruption induced.  Complications:  None  Impression: Small sliding hiatal hernia with changes of esophagitis at GE junction. Normal examination of stomach first and second part of the duodenum. Esophagus dilated by passing 56 [place] Maloney dilator but no mucosal disruption induced.  Recommendations:  Anti-reflux measures reinforced. Pantoprazole 40 mg by mouth every morning. Patient will call office with progress report in one week.  Shuntavia Yerby U  05/16/2012  10:21 AM  CC: Dr. Carylon Perches, MD & Dr. Bonnetta Barry ref. provider found

## 2012-05-16 NOTE — H&P (Signed)
[name] is an 78 y.o. female.   Chief Complaint: Patient is here for EGD and ED. HPI: Patient is 78 year old Caucasian female with chronic GERD now presents with dysphagia to solids. The symptoms started about a year ago she has had too many other issues going on she she could not come earlier. She has heartburn once or twice a week despite taking medication. She denies anorexia or weight loss. Since last EGD was in January 2010 she was found to have erosive reflux esophagitis and Schatzki's ring. Her esophagus was dilated to the [place] to 20 [place].  Past Medical History  Diagnosis Date  . Essential hypertension, benign   . 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 Crohn's disease   . Spinal stenosis   . PSVT (paroxysmal supraventricular tachycardia)   . History of TIAs   . GERD (gastroesophageal reflux disease)   . 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 Carotid artery disease     RICA 50-69% 12/09  . PONV (postoperative nausea and vomiting)     years ago had N/V, but not recently  . Myocardial infarction   . COPD (chronic obstructive pulmonary disease)   . Thyroid mass   . Incontinence of urine   . Anxiety   . Arthritis   . Atrial fibrillation     Postoperative - suboptimal Coumadin candidate  . CAD (coronary artery disease), native coronary artery   . Port-a-cath in place 01/22/2012    Past Surgical History  Procedure Date  . Left thoracotomy with wedge resection 2008    Left lower lobe  . Right tonsillectomy 2005  . Left thyroidectomy 2006  . Left breast biopsy 2011  . US echocardiography   . Cholecystectomy   . Cardiovascular stress test   . Abdominal hysterectomy   . Thyroidectomy 08/30/2011    Procedure: THYROIDECTOMY;  Surgeon: Leonette Most;  Location: [facility] OR;  Service: ENT;  Laterality: Right;    Family History  Problem Relation Age of Onset  . Coronary artery disease  Father     Died age 40  . Dementia Mother    Social History:  reports that she quit smoking about 18 years ago. Her smoking use included Cigarettes. She has never used smokeless tobacco. She reports that she does not drink alcohol or use illicit drugs.  Allergies:  Allergies  Allergen Reactions  . Codeine Nausea And Vomiting  . Demerol Other (See Comments)    Blood pressure  . Morphine And Related Nausea And Vomiting  . Penicillins Hives and Swelling    Medications Prior to Admission  Medication Sig Dispense Refill  . ALPRAZolam (XANAX) 0.25 MG tablet Take 0.25 mg by mouth at bedtime as needed. For sleep.      Marland Kitchen aspirin EC 81 MG tablet Take 1 tablet (81 mg total) by mouth daily.  150 tablet  2  . atenolol (TENORMIN) 25 MG tablet Take 1 tablet (25 mg total) by mouth daily.  30 tablet  6  . calcium carbonate (TUMS - DOSED IN MG ELEMENTAL CALCIUM) 500 MG chewable tablet Chew 1 tablet by mouth 2 (two) times daily.      Marland Kitchen ipratropium (ATROVENT HFA) 17 MCG/ACT inhaler Inhale 2 puffs into the lungs every 6 (six) hours. Shortness of breath      . isosorbide mononitrate (IMDUR) 30 MG 24 hr tablet Take 1 tablet (30 mg total)  by mouth daily.  30 tablet  12  . levothyroxine (SYNTHROID, LEVOTHROID) 88 MCG tablet Take 88 mcg by mouth daily.      . mesalamine (PENTASA) 250 MG CR capsule Take 1,000 mg by mouth 4 (four) times daily.       . OMEGA 3 1000 MG CAPS Take 1,000 mg by mouth 2 (two) times daily.      . potassium chloride (K-DUR) 10 MEQ tablet Take 1 tablet (10 mEq total) by mouth as needed.  30 tablet  6  . fluticasone (FLONASE) 50 MCG/ACT nasal spray Place 2 sprays into the nose daily.        . furosemide (LASIX) 20 MG tablet Take 1 tablet (20 mg total) by mouth as needed.  30 tablet  6  . lidocaine (LIDODERM) 5 % Place 1 patch onto the skin daily as needed. Remove & Discard patch within 12 hours or as directed by MD. Pain.        No results found for this or any previous visit (from the  past 48 hour(s)). No results found.  ROS  Blood pressure 162/68, pulse 58, temperature 97.7 F (36.5 C), temperature source Oral, resp. rate 20, SpO2 92.00%. Physical Exam  Constitutional: She appears well-developed and well-nourished.  HENT:  Mouth/Throat: Oropharynx is clear and moist.  Eyes: Conjunctivae are normal. No scleral icterus.  Neck: No thyromegaly present.  Cardiovascular: Normal rate, regular rhythm and normal heart sounds.   No murmur heard. Respiratory: Effort normal and breath sounds normal.  GI: Soft. She exhibits no distension and no mass. There is no tenderness.  Musculoskeletal: She exhibits no edema.  Neurological: She is alert.  Skin: Skin is warm and dry.     Assessment/Plan Solid food dysphagia. Chronic GERD and history of Schatzki's ring. EGD an EGD.  REHMAN,NAJEEB U 05/16/2012, 10:04 AM

## 2012-05-27 ENCOUNTER — Encounter (HOSPITAL_COMMUNITY): Payer: Medicare Other | Attending: Oncology

## 2012-05-27 DIAGNOSIS — C760 Malignant neoplasm of head, face and neck: Secondary | ICD-10-CM

## 2012-05-27 DIAGNOSIS — I779 Disorder of arteries and arterioles, unspecified: Secondary | ICD-10-CM | POA: Insufficient documentation

## 2012-05-27 DIAGNOSIS — Z452 Encounter for adjustment and management of vascular access device: Secondary | ICD-10-CM

## 2012-05-27 MED ORDER — HEPARIN SOD (PORK) LOCK FLUSH 100 UNIT/ML IV SOLN
500.0000 [IU] | Freq: Once | INTRAVENOUS | Status: AC
  Administered 2012-05-27: 500 [IU] via INTRAVENOUS
  Filled 2012-05-27: qty 5

## 2012-05-27 MED ORDER — SODIUM CHLORIDE 0.9 % IJ SOLN
INTRAMUSCULAR | Status: AC
  Filled 2012-05-27: qty 10

## 2012-05-27 MED ORDER — SODIUM CHLORIDE 0.9 % IJ SOLN
10.0000 mL | INTRAMUSCULAR | Status: DC | PRN
  Administered 2012-05-27: 10 mL via INTRAVENOUS
  Filled 2012-05-27: qty 10

## 2012-05-27 MED ORDER — HEPARIN SOD (PORK) LOCK FLUSH 100 UNIT/ML IV SOLN
INTRAVENOUS | Status: AC
  Filled 2012-05-27: qty 5

## 2012-05-27 NOTE — Progress Notes (Signed)
Tolerated port flush well. 

## 2012-05-29 ENCOUNTER — Encounter: Payer: Self-pay | Admitting: Cardiology

## 2012-05-29 ENCOUNTER — Ambulatory Visit (INDEPENDENT_AMBULATORY_CARE_PROVIDER_SITE_OTHER): Payer: Medicare Other | Admitting: Cardiology

## 2012-05-29 VITALS — BP 100/60 | HR 65 | Ht 64.5 in | Wt 203.0 lb

## 2012-05-29 DIAGNOSIS — I2510 Atherosclerotic heart disease of native coronary artery without angina pectoris: Secondary | ICD-10-CM

## 2012-05-29 DIAGNOSIS — I779 Disorder of arteries and arterioles, unspecified: Secondary | ICD-10-CM

## 2012-05-29 DIAGNOSIS — R0989 Other specified symptoms and signs involving the circulatory and respiratory systems: Secondary | ICD-10-CM

## 2012-05-29 NOTE — Patient Instructions (Addendum)
Your physician wants you to follow-up in: 4 months with Dr. Diona Browner.  You will receive a reminder letter in the mail two months in advance. If you don't receive a letter, please call our office to schedule the follow-up appointment.  Your physician has requested that you have a carotid duplex at [facility]. This test is an ultrasound of the carotid arteries in your neck. It looks at blood flow through these arteries that supply the brain with blood. Allow one hour for this exam. There are no restrictions or special instructions.

## 2012-05-29 NOTE — Assessment & Plan Note (Signed)
We are managing her medically. No change in current regimen, outlined above.

## 2012-05-29 NOTE — Assessment & Plan Note (Signed)
Due for followup carotid Dopplers with moderate disease on the right.

## 2012-05-29 NOTE — Progress Notes (Signed)
Clinical Summary [name] is a 78 y.o.female presenting for followup. She was seen in early August. Returns today with no specific complaints of progressive chest pain. As is usually the case, she has a number of other complaints discussed. Seems to be doing about the same in terms of her functional capacity. She reports compliance with her medications. Leg edema is better.  Echocardiogram recently showed LVEF 55-60%. We discussed the results.  Allergies  Allergen Reactions  . Codeine Nausea And Vomiting  . Demerol Other (See Comments)    Blood pressure  . Morphine And Related Nausea And Vomiting  . Penicillins Hives and Swelling    Current Outpatient Prescriptions  Medication Sig Dispense Refill  . ALPRAZolam (XANAX) 0.25 MG tablet Take 0.25 mg by mouth at bedtime as needed. For sleep.      Marland Kitchen aspirin EC 81 MG tablet Take 1 tablet (81 mg total) by mouth daily.  150 tablet  2  . atenolol (TENORMIN) 25 MG tablet Take 1 tablet (25 mg total) by mouth daily.  30 tablet  6  . calcium carbonate (TUMS - DOSED IN MG ELEMENTAL CALCIUM) 500 MG chewable tablet Chew 1 tablet by mouth 2 (two) times daily.      . fluticasone (FLONASE) 50 MCG/ACT nasal spray Place 2 sprays into the nose daily.        . furosemide (LASIX) 20 MG tablet Take 1 tablet (20 mg total) by mouth as needed.  30 tablet  6  . ipratropium (ATROVENT HFA) 17 MCG/ACT inhaler Inhale 2 puffs into the lungs every 6 (six) hours. Shortness of breath      . isosorbide mononitrate (IMDUR) 30 MG 24 hr tablet Take 30 mg by mouth as needed.      Marland Kitchen levothyroxine (SYNTHROID, LEVOTHROID) 88 MCG tablet Take 88 mcg by mouth daily.      Marland Kitchen lidocaine (LIDODERM) 5 % Place 1 patch onto the skin daily as needed. Remove & Discard patch within 12 hours or as directed by MD. Pain.      . mesalamine (PENTASA) 250 MG CR capsule Take 1,000 mg by mouth 4 (four) times daily.       . OMEGA 3 1000 MG CAPS Take 1,000 mg by mouth 2 (two) times daily.      .  pantoprazole (PROTONIX) 40 MG tablet Take 1 tablet (40 mg total) by mouth daily.  30 tablet  11  . potassium chloride (K-DUR) 10 MEQ tablet Take 1 tablet (10 mEq total) by mouth as needed.  30 tablet  6  . DISCONTD: isosorbide mononitrate (IMDUR) 30 MG 24 hr tablet Take 1 tablet (30 mg total) by mouth daily.  30 tablet  12    Past Medical History  Diagnosis Date  . Essential hypertension, benign   . 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 Crohn's disease   . Spinal stenosis   . PSVT (paroxysmal supraventricular tachycardia)   . History of TIAs   . GERD (gastroesophageal reflux disease)   . 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 Carotid artery disease     RICA 50-69% 12/09  . PONV (postoperative nausea and vomiting)     years ago had N/V, but not recently  . Myocardial infarction   . COPD (chronic obstructive pulmonary disease)   . Thyroid mass   . Incontinence of urine   . Anxiety   .  Arthritis   . Atrial fibrillation     Postoperative - suboptimal Coumadin candidate  . CAD (coronary artery disease), native coronary artery   . Port-a-cath in place 01/22/2012    Past Surgical History  Procedure Date  . Left thoracotomy with wedge resection 2008    Left lower lobe  . Right tonsillectomy 2005  . Left thyroidectomy 2006  . Left breast biopsy 2011  . US echocardiography   . Cholecystectomy   . Cardiovascular stress test   . Abdominal hysterectomy   . Thyroidectomy 08/30/2011    Procedure: THYROIDECTOMY;  Surgeon: Leonette Most;  Location: [facility] OR;  Service: ENT;  Laterality: Right;    Social History [name] reports that she quit smoking about 18 years ago. Her smoking use included Cigarettes. She has never used smokeless tobacco. [name] reports that she does not drink alcohol.  Review of Systems No palpitations. Occasional headaches. Feeling of shakiness inside. Intermittent shakes  her hands. No frank syncope. No reported bleeding episodes.  Physical Examination Filed Vitals:   05/29/12 1419  BP: 100/60  Pulse: 65    Overweight woman in no acute distress.  HEENT: Conjuctivae and lids normal, oropharynx clear.  Neck: Supple, no JVD or obvious bruit.  Lungs: Clear, decreased breath sounds at bases.  Cardiac: Regular rate and rhythm, soft systolic murmur, no S3.  Abdomen: Soft, NABS.  Skin: Warm and dry.  Extremities: Venous stasis and spider veins, right worse than left, trace edema.   Problem List and Plan   Coronary atherosclerosis of native coronary artery We are managing her medically. No change in current regimen, outlined above.  Carotid artery disease Due for followup carotid Dopplers with moderate disease on the right.    [name], M.D., F.A.C.C.

## 2012-05-30 NOTE — Addendum Note (Signed)
Addended by: Reather Laurence A on: 05/30/2012 09:04 AM   Modules accepted: Orders

## 2012-06-04 ENCOUNTER — Ambulatory Visit (HOSPITAL_COMMUNITY)
Admission: RE | Admit: 2012-06-04 | Discharge: 2012-06-04 | Disposition: A | Discharge status: Home / Self Care | Payer: Medicare Other | Source: Ambulatory Visit | Attending: Cardiology | Admitting: Cardiology

## 2012-06-04 DIAGNOSIS — I2510 Atherosclerotic heart disease of native coronary artery without angina pectoris: Secondary | ICD-10-CM | POA: Insufficient documentation

## 2012-06-04 DIAGNOSIS — I10 Essential (primary) hypertension: Secondary | ICD-10-CM | POA: Insufficient documentation

## 2012-06-04 DIAGNOSIS — R0989 Other specified symptoms and signs involving the circulatory and respiratory systems: Secondary | ICD-10-CM | POA: Insufficient documentation

## 2012-06-04 DIAGNOSIS — F17200 Nicotine dependence, unspecified, uncomplicated: Secondary | ICD-10-CM | POA: Insufficient documentation

## 2012-06-04 DIAGNOSIS — I6529 Occlusion and stenosis of unspecified carotid artery: Secondary | ICD-10-CM | POA: Insufficient documentation

## 2012-07-08 ENCOUNTER — Encounter (HOSPITAL_COMMUNITY): Payer: Medicare Other

## 2012-07-15 ENCOUNTER — Encounter (HOSPITAL_COMMUNITY): Payer: Medicare Other

## 2012-07-16 ENCOUNTER — Encounter (HOSPITAL_COMMUNITY): Payer: Medicare Other | Attending: Oncology

## 2012-07-16 VITALS — BP 145/75 | HR 88 | Temp 97.4°F | Resp 18

## 2012-07-16 DIAGNOSIS — Z9889 Other specified postprocedural states: Secondary | ICD-10-CM | POA: Insufficient documentation

## 2012-07-16 DIAGNOSIS — Z23 Encounter for immunization: Secondary | ICD-10-CM

## 2012-07-16 DIAGNOSIS — Z95828 Presence of other vascular implants and grafts: Secondary | ICD-10-CM

## 2012-07-16 MED ORDER — SODIUM CHLORIDE 0.9 % IJ SOLN
INTRAMUSCULAR | Status: AC
  Filled 2012-07-16: qty 10

## 2012-07-16 MED ORDER — SODIUM CHLORIDE 0.9 % IJ SOLN
10.0000 mL | INTRAMUSCULAR | Status: DC | PRN
  Administered 2012-07-16: 10 mL via INTRAVENOUS
  Filled 2012-07-16: qty 10

## 2012-07-16 MED ORDER — INFLUENZA VIRUS VACC SPLIT PF IM SUSP
0.5000 mL | Freq: Once | INTRAMUSCULAR | Status: AC
  Administered 2012-07-16: 0.5 mL via INTRAMUSCULAR

## 2012-07-16 MED ORDER — INFLUENZA VIRUS VACC SPLIT PF IM SUSP
INTRAMUSCULAR | Status: AC
  Filled 2012-07-16: qty 0.5

## 2012-07-16 MED ORDER — HEPARIN SOD (PORK) LOCK FLUSH 100 UNIT/ML IV SOLN
INTRAVENOUS | Status: AC
  Filled 2012-07-16: qty 5

## 2012-07-16 MED ORDER — HEPARIN SOD (PORK) LOCK FLUSH 100 UNIT/ML IV SOLN
500.0000 [IU] | Freq: Once | INTRAVENOUS | Status: AC
  Administered 2012-07-16: 500 [IU] via INTRAVENOUS
  Filled 2012-07-16: qty 5

## 2012-07-16 NOTE — Progress Notes (Signed)
[name] presented for Portacath access and flush. Proper placement of portacath confirmed by CXR. Portacath located right chest wall accessed with  H 20 needle. Good blood return present. Portacath flushed with 20ml NS and 500U/5ml Heparin and needle removed intact. Procedure without incident. Patient tolerated procedure well.   

## 2012-08-26 ENCOUNTER — Encounter (HOSPITAL_COMMUNITY): Payer: Medicare Other

## 2012-09-02 ENCOUNTER — Ambulatory Visit: Payer: Medicare Other | Admitting: Cardiology

## 2012-09-02 ENCOUNTER — Other Ambulatory Visit: Payer: Self-pay | Admitting: Cardiology

## 2012-09-09 ENCOUNTER — Encounter (HOSPITAL_COMMUNITY): Payer: Medicare Other | Attending: Oncology

## 2012-09-09 DIAGNOSIS — C760 Malignant neoplasm of head, face and neck: Secondary | ICD-10-CM

## 2012-09-09 DIAGNOSIS — Z95828 Presence of other vascular implants and grafts: Secondary | ICD-10-CM

## 2012-09-09 DIAGNOSIS — Z452 Encounter for adjustment and management of vascular access device: Secondary | ICD-10-CM

## 2012-09-09 MED ORDER — HEPARIN SOD (PORK) LOCK FLUSH 100 UNIT/ML IV SOLN
INTRAVENOUS | Status: AC
  Filled 2012-09-09: qty 5

## 2012-09-09 MED ORDER — SODIUM CHLORIDE 0.9 % IJ SOLN
10.0000 mL | INTRAMUSCULAR | Status: DC | PRN
  Administered 2012-09-09: 10 mL via INTRAVENOUS
  Filled 2012-09-09: qty 10

## 2012-09-09 MED ORDER — HEPARIN SOD (PORK) LOCK FLUSH 100 UNIT/ML IV SOLN
500.0000 [IU] | Freq: Once | INTRAVENOUS | Status: AC
  Administered 2012-09-09: 500 [IU] via INTRAVENOUS
  Filled 2012-09-09: qty 5

## 2012-09-09 NOTE — Progress Notes (Signed)
Tolerated port flush well.  Good blood return. 

## 2012-10-07 ENCOUNTER — Ambulatory Visit: Payer: Medicare Other | Admitting: Cardiology

## 2012-10-14 ENCOUNTER — Ambulatory Visit (INDEPENDENT_AMBULATORY_CARE_PROVIDER_SITE_OTHER): Payer: Medicare Other | Admitting: Cardiology

## 2012-10-14 ENCOUNTER — Encounter: Payer: Self-pay | Admitting: Cardiology

## 2012-10-14 VITALS — BP 120/61 | HR 67 | Ht 64.0 in | Wt 201.8 lb

## 2012-10-14 DIAGNOSIS — I2510 Atherosclerotic heart disease of native coronary artery without angina pectoris: Secondary | ICD-10-CM

## 2012-10-14 DIAGNOSIS — I10 Essential (primary) hypertension: Secondary | ICD-10-CM

## 2012-10-14 DIAGNOSIS — I779 Disorder of arteries and arterioles, unspecified: Secondary | ICD-10-CM

## 2012-10-14 NOTE — Patient Instructions (Addendum)
Your physician recommends that you schedule a follow-up appointment in: 6 MONTHS WITH SM  Your physician recommends that you return for lab work in: NEXT WEEK TO BE ADDED TO CURRENT LAB ORDERS FOR THYROID) SLIPS GIVEN TODAY (BMET,MAGNESIUM)  Your physician recommends that you continue on your current medications as directed. Please refer to the Current Medication list given to you today.

## 2012-10-14 NOTE — Assessment & Plan Note (Signed)
Good blood pressure control today. 

## 2012-10-14 NOTE — Assessment & Plan Note (Signed)
Continue medical therapy and observation. ECG is stable compared to prior tracing. LVEF 49% with inferior scar by most recent Myoview. She continues on intermittent Lasix and potassium, will followup BMET and magnesium levels.

## 2012-10-14 NOTE — Assessment & Plan Note (Signed)
Moderate disease, stable by last Doppler in 2013.

## 2012-10-14 NOTE — Progress Notes (Signed)
Clinical Summary [name] is a medically complex 79 y.o.female presenting for followup. She was last seen in August 2013. She reports with several different complaints today. Mentions that approximately 5 weeks ago she awoke with bad reflux symptoms, got out of bed and felt a weight in her shoulders and arms, intense overall, but resolved within 5 minutes. She has had no recurrent symptoms since that time.  Carotid Dopplers in September 2013 showed stable bilateral ICA disease, 50-69% on the right, and less than 50% left. We reviewed this today.  ECG today shows sinus rhythm, LAD, stable compared to prior tracing. Myoview from October 2012 showed LVEF 49% with inferior scar but no ischemia. Plan to continue medical therapy at this time.  She has been using Lasix and potassium intermittently, no recent followup electrolytes.   Allergies  Allergen Reactions  . Codeine Nausea And Vomiting  . Demerol Other (See Comments)    Blood pressure  . Morphine And Related Nausea And Vomiting  . Penicillins Hives and Swelling    Current Outpatient Prescriptions  Medication Sig Dispense Refill  . ALPRAZolam (XANAX) 0.25 MG tablet Take 0.25 mg by mouth at bedtime as needed. For sleep.      . calcium carbonate (TUMS - DOSED IN MG ELEMENTAL CALCIUM) 500 MG chewable tablet Chew 1 tablet by mouth 2 (two) times daily.      . fluticasone (FLONASE) 50 MCG/ACT nasal spray Place 2 sprays into the nose daily.        . furosemide (LASIX) 20 MG tablet Take 1 tablet (20 mg total) by mouth as needed.  30 tablet  6  . ipratropium (ATROVENT HFA) 17 MCG/ACT inhaler Inhale 2 puffs into the lungs every 6 (six) hours. Shortness of breath      . isosorbide mononitrate (IMDUR) 30 MG 24 hr tablet Take 30 mg by mouth as needed.      Marland Kitchen levothyroxine (SYNTHROID, LEVOTHROID) 88 MCG tablet Take 88 mcg by mouth daily.      Marland Kitchen lidocaine (LIDODERM) 5 % Place 1 patch onto the skin daily as needed. Remove & Discard patch within 12  hours or as directed by MD. Pain.      . mesalamine (PENTASA) 250 MG CR capsule Take 1,000 mg by mouth 4 (four) times daily.       . OMEGA 3 1000 MG CAPS Take 1,000 mg by mouth 2 (two) times daily.      . pantoprazole (PROTONIX) 40 MG tablet Take 1 tablet (40 mg total) by mouth daily.  30 tablet  11  . potassium chloride (K-DUR,KLOR-CON) 10 MEQ tablet Take 10 mEq by mouth as needed.       Marland Kitchen PROAIR HFA 108 (90 BASE) MCG/ACT inhaler       . TENORMIN 25 MG tablet TAKE (1) TABLET BY MOUTH ONCE DAILY.  30 tablet  4    Past Medical History  Diagnosis Date  . Essential hypertension, benign   . 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 Crohn's disease   . Spinal stenosis   . PSVT (paroxysmal supraventricular tachycardia)   . History of TIAs   . GERD (gastroesophageal reflux disease)   . 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 Carotid artery disease     RICA 50-69% 12/09  . PONV (postoperative nausea and vomiting)     years ago had N/V, but not recently  .  Myocardial infarction   . COPD (chronic obstructive pulmonary disease)   . Thyroid mass   . Incontinence of urine   . Anxiety   . Arthritis   . Atrial fibrillation     Postoperative - suboptimal Coumadin candidate  . CAD (coronary artery disease), native coronary artery   . Port-a-cath in place 01/22/2012    Social History [name] reports that she quit smoking about 18 years ago. Her smoking use included Cigarettes. She has never used smokeless tobacco. [name] reports that she does not drink alcohol.  Review of Systems Intermittent abdominal bloating, reflux symptoms, forgetfulness, epigastric discomfort, back pain. Otherwise negative.  Physical Examination Filed Vitals:   10/14/12 1406  BP: 120/61  Pulse: 67   Filed Weights   10/14/12 1406  Weight: 201 lb 12 oz (91.513 kg)    Overweight woman in no acute distress.  HEENT:  Conjuctivae and lids normal, oropharynx clear.  Neck: Supple, no JVD or obvious bruit.  Lungs: Clear, decreased breath sounds at bases.  Cardiac: Regular rate and rhythm, soft systolic murmur, no S3.  Abdomen: Soft, NABS.  Skin: Warm and dry.  Extremities: Venous stasis and spider veins, right worse than left, trace edema.   Problem List and Plan   Coronary atherosclerosis of native coronary artery Continue medical therapy and observation. ECG is stable compared to prior tracing. LVEF 49% with inferior scar by most recent Myoview. She continues on intermittent Lasix and potassium, will followup BMET and magnesium levels.  Essential hypertension, benign Good blood pressure control today.  Carotid artery disease Moderate disease, stable by last Doppler in 2013.    [name], M.D., F.A.C.C.

## 2012-10-21 ENCOUNTER — Encounter (HOSPITAL_COMMUNITY): Payer: Medicare Other | Attending: Oncology

## 2012-10-21 ENCOUNTER — Encounter: Payer: Self-pay | Admitting: *Deleted

## 2012-10-21 DIAGNOSIS — Z452 Encounter for adjustment and management of vascular access device: Secondary | ICD-10-CM

## 2012-10-21 DIAGNOSIS — Z95828 Presence of other vascular implants and grafts: Secondary | ICD-10-CM

## 2012-10-21 DIAGNOSIS — C760 Malignant neoplasm of head, face and neck: Secondary | ICD-10-CM

## 2012-10-21 DIAGNOSIS — Z9889 Other specified postprocedural states: Secondary | ICD-10-CM | POA: Insufficient documentation

## 2012-10-21 LAB — BASIC METABOLIC PANEL
CO2: 35 mEq/L — ABNORMAL HIGH (ref 19–32)
Chloride: 98 mEq/L (ref 96–112)
Sodium: 139 mEq/L (ref 135–145)

## 2012-10-21 MED ORDER — SODIUM CHLORIDE 0.9 % IJ SOLN
10.0000 mL | INTRAMUSCULAR | Status: DC | PRN
  Administered 2012-10-21: 10 mL via INTRAVENOUS
  Filled 2012-10-21: qty 10

## 2012-10-21 MED ORDER — HEPARIN SOD (PORK) LOCK FLUSH 100 UNIT/ML IV SOLN
500.0000 [IU] | Freq: Once | INTRAVENOUS | Status: AC
  Administered 2012-10-21: 500 [IU] via INTRAVENOUS
  Filled 2012-10-21: qty 5

## 2012-10-21 MED ORDER — HEPARIN SOD (PORK) LOCK FLUSH 100 UNIT/ML IV SOLN
INTRAVENOUS | Status: AC
  Filled 2012-10-21: qty 5

## 2012-10-21 NOTE — Progress Notes (Signed)
[name] presented for Portacath access and flush. Proper placement of portacath confirmed by CXR. Portacath located right chest wall accessed with  H 20 needle. Good blood return present. Portacath flushed with 20ml NS and 500U/5ml Heparin and needle removed intact. Procedure without incident. Patient tolerated procedure well.   

## 2012-10-31 ENCOUNTER — Other Ambulatory Visit: Payer: Self-pay | Admitting: Internal Medicine

## 2012-11-04 ENCOUNTER — Other Ambulatory Visit: Payer: Self-pay | Admitting: Internal Medicine

## 2012-11-05 ENCOUNTER — Other Ambulatory Visit: Payer: Self-pay | Admitting: Internal Medicine

## 2012-11-06 ENCOUNTER — Other Ambulatory Visit: Payer: Self-pay | Admitting: Internal Medicine

## 2012-11-07 NOTE — Telephone Encounter (Signed)
This prescription was called by Dr.Rehman to [facility] [place]. I have verified with Pharmacy/Lynn that this was rec'd.  

## 2012-11-07 NOTE — Telephone Encounter (Signed)
This prescription was called by Dr.Rehman to [facility] / [place]. I have verified with Pharmacy/Lynn that this was rec'd.

## 2012-12-02 ENCOUNTER — Encounter (HOSPITAL_COMMUNITY): Payer: Medicare Other

## 2012-12-09 ENCOUNTER — Encounter (HOSPITAL_COMMUNITY): Payer: Medicare Other | Attending: Oncology

## 2012-12-09 DIAGNOSIS — Z9889 Other specified postprocedural states: Secondary | ICD-10-CM | POA: Insufficient documentation

## 2012-12-09 DIAGNOSIS — C760 Malignant neoplasm of head, face and neck: Secondary | ICD-10-CM

## 2012-12-09 MED ORDER — HEPARIN SOD (PORK) LOCK FLUSH 100 UNIT/ML IV SOLN
500.0000 [IU] | Freq: Once | INTRAVENOUS | Status: AC
  Administered 2012-12-09: 500 [IU] via INTRAVENOUS
  Filled 2012-12-09: qty 5

## 2012-12-09 MED ORDER — SODIUM CHLORIDE 0.9 % IJ SOLN
10.0000 mL | INTRAMUSCULAR | Status: DC | PRN
  Administered 2012-12-09: 10 mL via INTRAVENOUS
  Filled 2012-12-09: qty 10

## 2012-12-09 MED ORDER — HEPARIN SOD (PORK) LOCK FLUSH 100 UNIT/ML IV SOLN
INTRAVENOUS | Status: AC
  Filled 2012-12-09: qty 5

## 2012-12-09 NOTE — Progress Notes (Signed)
Lorren K Hendry presented for Portacath access and flush. Proper placement of portacath confirmed by CXR. Portacath located rt  chest wall accessed with  H 20 needle. Good blood return present. Portacath flushed with 20ml NS and 500U/5ml Heparin and needle removed intact. Procedure without incident. Patient tolerated procedure well.   

## 2013-01-20 ENCOUNTER — Encounter (HOSPITAL_COMMUNITY): Payer: Medicare Other | Attending: Oncology

## 2013-01-20 DIAGNOSIS — Z95828 Presence of other vascular implants and grafts: Secondary | ICD-10-CM

## 2013-01-20 DIAGNOSIS — Z452 Encounter for adjustment and management of vascular access device: Secondary | ICD-10-CM

## 2013-01-20 DIAGNOSIS — C760 Malignant neoplasm of head, face and neck: Secondary | ICD-10-CM

## 2013-01-20 DIAGNOSIS — I779 Disorder of arteries and arterioles, unspecified: Secondary | ICD-10-CM | POA: Insufficient documentation

## 2013-01-20 DIAGNOSIS — Z9889 Other specified postprocedural states: Secondary | ICD-10-CM | POA: Insufficient documentation

## 2013-01-20 MED ORDER — HEPARIN SOD (PORK) LOCK FLUSH 100 UNIT/ML IV SOLN
500.0000 [IU] | Freq: Once | INTRAVENOUS | Status: AC
  Administered 2013-01-20: 500 [IU] via INTRAVENOUS
  Filled 2013-01-20: qty 5

## 2013-01-20 MED ORDER — HEPARIN SOD (PORK) LOCK FLUSH 100 UNIT/ML IV SOLN
INTRAVENOUS | Status: AC
  Filled 2013-01-20: qty 5

## 2013-01-20 MED ORDER — SODIUM CHLORIDE 0.9 % IJ SOLN
10.0000 mL | INTRAMUSCULAR | Status: DC | PRN
  Administered 2013-01-20: 10 mL via INTRAVENOUS
  Filled 2013-01-20: qty 10

## 2013-01-20 NOTE — Progress Notes (Signed)
[name] presented for Portacath access and flush. Proper placement of portacath confirmed by CXR. Portacath located right chest wall accessed with  H 20 needle. Good blood return present. Portacath flushed with 20ml NS and 500U/5ml Heparin and needle removed intact. Procedure without incident. Patient tolerated procedure well.   

## 2013-01-30 ENCOUNTER — Other Ambulatory Visit: Payer: Self-pay | Admitting: Cardiology

## 2013-02-04 ENCOUNTER — Other Ambulatory Visit (HOSPITAL_COMMUNITY): Payer: Self-pay | Admitting: Internal Medicine

## 2013-02-04 DIAGNOSIS — Z139 Encounter for screening, unspecified: Secondary | ICD-10-CM

## 2013-02-06 ENCOUNTER — Ambulatory Visit (HOSPITAL_COMMUNITY)
Admission: RE | Admit: 2013-02-06 | Discharge: 2013-02-06 | Disposition: A | Discharge status: Home / Self Care | Payer: Medicare Other | Source: Ambulatory Visit | Attending: Internal Medicine | Admitting: Internal Medicine

## 2013-02-06 DIAGNOSIS — Z139 Encounter for screening, unspecified: Secondary | ICD-10-CM

## 2013-02-06 DIAGNOSIS — Z1231 Encounter for screening mammogram for malignant neoplasm of breast: Secondary | ICD-10-CM | POA: Insufficient documentation

## 2013-03-04 ENCOUNTER — Encounter (HOSPITAL_COMMUNITY): Payer: Medicare Other

## 2013-03-10 ENCOUNTER — Encounter (HOSPITAL_COMMUNITY): Payer: Medicare Other | Attending: Oncology

## 2013-03-10 DIAGNOSIS — Z9889 Other specified postprocedural states: Secondary | ICD-10-CM | POA: Insufficient documentation

## 2013-03-10 DIAGNOSIS — Z452 Encounter for adjustment and management of vascular access device: Secondary | ICD-10-CM

## 2013-03-10 DIAGNOSIS — Z95828 Presence of other vascular implants and grafts: Secondary | ICD-10-CM

## 2013-03-10 DIAGNOSIS — C760 Malignant neoplasm of head, face and neck: Secondary | ICD-10-CM

## 2013-03-10 MED ORDER — SODIUM CHLORIDE 0.9 % IJ SOLN
10.0000 mL | INTRAMUSCULAR | Status: DC | PRN
  Administered 2013-03-10: 10 mL via INTRAVENOUS
  Filled 2013-03-10: qty 10

## 2013-03-10 MED ORDER — HEPARIN SOD (PORK) LOCK FLUSH 100 UNIT/ML IV SOLN
INTRAVENOUS | Status: AC
  Filled 2013-03-10: qty 5

## 2013-03-10 MED ORDER — HEPARIN SOD (PORK) LOCK FLUSH 100 UNIT/ML IV SOLN
500.0000 [IU] | Freq: Once | INTRAVENOUS | Status: AC
  Administered 2013-03-10: 500 [IU] via INTRAVENOUS
  Filled 2013-03-10: qty 5

## 2013-03-10 NOTE — Progress Notes (Signed)
[name] presented for Portacath access and flush. Proper placement of portacath confirmed by CXR. Portacath located  chest wall accessed with  H 20 needle. Good blood return present. Portacath flushed with 20ml NS and 500U/48ml Heparin and needle removed intact. Procedure without incident. Patient tolerated procedure well.

## 2013-03-23 ENCOUNTER — Encounter (HOSPITAL_COMMUNITY): Payer: Self-pay | Admitting: *Deleted

## 2013-03-23 ENCOUNTER — Emergency Department (HOSPITAL_COMMUNITY): Payer: Medicare Other

## 2013-03-23 ENCOUNTER — Emergency Department (HOSPITAL_COMMUNITY)
Admission: EM | Admit: 2013-03-23 | Discharge: 2013-03-23 | Disposition: A | Discharge status: Home / Self Care | Payer: Medicare Other | Attending: Emergency Medicine | Admitting: Emergency Medicine

## 2013-03-23 DIAGNOSIS — Z8673 Personal history of transient ischemic attack (TIA), and cerebral infarction without residual deficits: Secondary | ICD-10-CM | POA: Insufficient documentation

## 2013-03-23 DIAGNOSIS — Z9889 Other specified postprocedural states: Secondary | ICD-10-CM | POA: Insufficient documentation

## 2013-03-23 DIAGNOSIS — K219 Gastro-esophageal reflux disease without esophagitis: Secondary | ICD-10-CM | POA: Insufficient documentation

## 2013-03-23 DIAGNOSIS — J209 Acute bronchitis, unspecified: Secondary | ICD-10-CM | POA: Insufficient documentation

## 2013-03-23 DIAGNOSIS — Z880 Allergy status to penicillin: Secondary | ICD-10-CM | POA: Insufficient documentation

## 2013-03-23 DIAGNOSIS — J3489 Other specified disorders of nose and nasal sinuses: Secondary | ICD-10-CM | POA: Insufficient documentation

## 2013-03-23 DIAGNOSIS — R6883 Chills (without fever): Secondary | ICD-10-CM | POA: Insufficient documentation

## 2013-03-23 DIAGNOSIS — Z8679 Personal history of other diseases of the circulatory system: Secondary | ICD-10-CM | POA: Insufficient documentation

## 2013-03-23 DIAGNOSIS — J449 Chronic obstructive pulmonary disease, unspecified: Secondary | ICD-10-CM | POA: Insufficient documentation

## 2013-03-23 DIAGNOSIS — C3490 Malignant neoplasm of unspecified part of unspecified bronchus or lung: Secondary | ICD-10-CM | POA: Insufficient documentation

## 2013-03-23 DIAGNOSIS — E079 Disorder of thyroid, unspecified: Secondary | ICD-10-CM | POA: Insufficient documentation

## 2013-03-23 DIAGNOSIS — I2510 Atherosclerotic heart disease of native coronary artery without angina pectoris: Secondary | ICD-10-CM | POA: Insufficient documentation

## 2013-03-23 DIAGNOSIS — I252 Old myocardial infarction: Secondary | ICD-10-CM | POA: Insufficient documentation

## 2013-03-23 DIAGNOSIS — Z87891 Personal history of nicotine dependence: Secondary | ICD-10-CM | POA: Insufficient documentation

## 2013-03-23 DIAGNOSIS — J4489 Other specified chronic obstructive pulmonary disease: Secondary | ICD-10-CM | POA: Insufficient documentation

## 2013-03-23 DIAGNOSIS — Z79899 Other long term (current) drug therapy: Secondary | ICD-10-CM | POA: Insufficient documentation

## 2013-03-23 DIAGNOSIS — J40 Bronchitis, not specified as acute or chronic: Secondary | ICD-10-CM

## 2013-03-23 DIAGNOSIS — IMO0002 Reserved for concepts with insufficient information to code with codable children: Secondary | ICD-10-CM | POA: Insufficient documentation

## 2013-03-23 DIAGNOSIS — R042 Hemoptysis: Secondary | ICD-10-CM | POA: Insufficient documentation

## 2013-03-23 DIAGNOSIS — R0602 Shortness of breath: Secondary | ICD-10-CM | POA: Insufficient documentation

## 2013-03-23 DIAGNOSIS — Z8612 Personal history of poliomyelitis: Secondary | ICD-10-CM | POA: Insufficient documentation

## 2013-03-23 DIAGNOSIS — Z8719 Personal history of other diseases of the digestive system: Secondary | ICD-10-CM | POA: Insufficient documentation

## 2013-03-23 DIAGNOSIS — Z8739 Personal history of other diseases of the musculoskeletal system and connective tissue: Secondary | ICD-10-CM | POA: Insufficient documentation

## 2013-03-23 DIAGNOSIS — F411 Generalized anxiety disorder: Secondary | ICD-10-CM | POA: Insufficient documentation

## 2013-03-23 LAB — BASIC METABOLIC PANEL
CO2: 32 mEq/L (ref 19–32)
Calcium: 9.2 mg/dL (ref 8.4–10.5)
GFR calc non Af Amer: 76 mL/min — ABNORMAL LOW (ref >90)
Potassium: 3.9 mEq/L (ref 3.5–5.1)
Sodium: 136 mEq/L (ref 135–145)

## 2013-03-23 LAB — CBC WITH DIFFERENTIAL/PLATELET
Basophils Relative: 0 % (ref 0–1)
HCT: 42 % (ref 36.0–46.0)
Hemoglobin: 13.8 g/dL (ref 12.0–15.0)
Lymphocytes Relative: 19 % (ref 12–46)
MCHC: 32.9 g/dL (ref 30.0–36.0)
Monocytes Absolute: 0.6 10*3/uL (ref 0.1–1.0)
Monocytes Relative: 8 % (ref 3–12)
Neutro Abs: 4.9 10*3/uL (ref 1.7–7.7)
Neutrophils Relative %: 71 % (ref 43–77)
RBC: 4.49 MIL/uL (ref 3.87–5.11)
WBC: 6.9 10*3/uL (ref 4.0–10.5)

## 2013-03-23 MED ORDER — LEVOFLOXACIN 750 MG PO TABS
750.0000 mg | ORAL_TABLET | Freq: Once | ORAL | Status: AC
  Administered 2013-03-23: 750 mg via ORAL
  Filled 2013-03-23: qty 1

## 2013-03-23 MED ORDER — PREDNISONE 50 MG PO TABS
60.0000 mg | ORAL_TABLET | Freq: Once | ORAL | Status: AC
  Administered 2013-03-23: 60 mg via ORAL
  Filled 2013-03-23: qty 1

## 2013-03-23 MED ORDER — PREDNISONE 20 MG PO TABS: ORAL_TABLET | ORAL | Status: DC

## 2013-03-23 MED ORDER — LEVOFLOXACIN 750 MG PO TABS: 750.0000 mg | ORAL_TABLET | Freq: Every day | ORAL | Status: DC

## 2013-03-23 MED ORDER — ALBUTEROL SULFATE (5 MG/ML) 0.5% IN NEBU
2.5000 mg | INHALATION_SOLUTION | RESPIRATORY_TRACT | Status: DC
  Administered 2013-03-23: 2.5 mg via RESPIRATORY_TRACT
  Filled 2013-03-23: qty 0.5

## 2013-03-23 MED ORDER — IPRATROPIUM BROMIDE 0.02 % IN SOLN
0.5000 mg | RESPIRATORY_TRACT | Status: DC
  Administered 2013-03-23: 0.5 mg via RESPIRATORY_TRACT
  Filled 2013-03-23: qty 2.5

## 2013-03-23 NOTE — ED Provider Notes (Signed)
History  This chart was scribed for Gilda Crease, * by Manuela Schwartz, ED scribe. This patient was seen in room APA05/APA05 and the patient's care was started at 1926.  CSN: [id number] Arrival date & time 03/23/13  4098  First MD Initiated Contact with Patient 03/23/13 1939     Chief Complaint  Patient presents with  . Cough   The history is provided by the patient. No language interpreter was used.  HPI Comments: [name] is a 79 y.o. female who presents to the Emergency Department w/hx of COPD, lung CA, complaining of a constant gradually worsening moderate cough onset 2 days ago. [name] states associated SOB, and chills. [name] uses 02 at home at nighttime and an inhaler during the day. [name] has had x2 lung surgeries for lung CA, radiation and is currently followed by [facility] oncology for her lung cancer treatment.    Past Medical History  Diagnosis Date  . Essential hypertension, benign   . Crohn's disease   . Spinal stenosis   . PSVT (paroxysmal supraventricular tachycardia)   . History of TIAs   . GERD (gastroesophageal reflux disease)   . Carotid artery disease     RICA 50-69% 12/09  . PONV (postoperative nausea and vomiting)     years ago had N/V, but not recently  . Myocardial infarction   . COPD (chronic obstructive pulmonary disease)   . Thyroid mass   . Incontinence of urine   . Anxiety   . Arthritis   . Atrial fibrillation     Postoperative - suboptimal Coumadin candidate  . CAD (coronary artery disease), native coronary artery   . Port-a-cath in place 01/22/2012  . 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 Carcinoma of tonsillar pillars (anterior) (posterior)     XRT and resection   Past Surgical History  Procedure Laterality Date  . Left thoracotomy with wedge resection  2008    Left lower lobe  . Right tonsillectomy  2005  . Left thyroidectomy  2006  . Left breast biopsy  2011  . US echocardiography     . Cholecystectomy    . Cardiovascular stress test    . Abdominal hysterectomy    . Thyroidectomy  08/30/2011    Procedure: THYROIDECTOMY;  Surgeon: Leonette Most;  Location: [facility] OR;  Service: ENT;  Laterality: Right;   Family History  Problem Relation Age of Onset  . Coronary artery disease Father     Died age 13  . Dementia Mother    History  Substance Use Topics  . Smoking status: Former Smoker    Types: Cigarettes    Quit date: 10/20/1993  . Smokeless tobacco: Never Used  . Alcohol Use: No   OB History   Grav Para Term Preterm Abortions TAB SAB Ect Mult Living                 Review of Systems  Constitutional: Negative for fever and chills.  HENT: Positive for congestion.        Hemoptysis   Respiratory: Positive for cough and shortness of breath.   Gastrointestinal: Negative for nausea and vomiting.  Neurological: Negative for weakness.  All other systems reviewed and are negative.  A complete 10 system review of systems was obtained and all systems are negative except as noted in the HPI and PMH.    Allergies  Codeine; Demerol; Morphine and related; and Penicillins  Home  Medications   Current Outpatient Rx  Name  Route  Sig  Dispense  Refill  . ALPRAZolam (XANAX) 0.25 MG tablet   Oral   Take 0.25 mg by mouth at bedtime as needed. For sleep.         Marland Kitchen atenolol (TENORMIN) 25 MG tablet      TAKE (1) TABLET BY MOUTH ONCE DAILY.   30 tablet   3   . calcium carbonate (TUMS - DOSED IN MG ELEMENTAL CALCIUM) 500 MG chewable tablet   Oral   Chew 1 tablet by mouth 2 (two) times daily.         . fluticasone (FLONASE) 50 MCG/ACT nasal spray   Nasal   Place 2 sprays into the nose daily.           . furosemide (LASIX) 20 MG tablet   Oral   Take 1 tablet (20 mg total) by mouth as needed.   30 tablet   6   . ipratropium (ATROVENT HFA) 17 MCG/ACT inhaler   Inhalation   Inhale 2 puffs into the lungs every 6 (six) hours. Shortness of breath         .  isosorbide mononitrate (IMDUR) 30 MG 24 hr tablet   Oral   Take 30 mg by mouth as needed.         Marland Kitchen levothyroxine (SYNTHROID, LEVOTHROID) 88 MCG tablet   Oral   Take 88 mcg by mouth daily.         Marland Kitchen lidocaine (LIDODERM) 5 %   Transdermal   Place 1 patch onto the skin daily as needed. Remove & Discard patch within 12 hours or as directed by MD. Pain.         Marland Kitchen OMEGA 3 1000 MG CAPS   Oral   Take 1,000 mg by mouth 2 (two) times daily.         . pantoprazole (PROTONIX) 40 MG tablet   Oral   Take 1 tablet (40 mg total) by mouth daily.   30 tablet   11   . PENTASA 250 MG CR capsule      TAKE 4 CAPSULES 4 TIMES DAILY.   480 capsule   11     DID NOT RECEIVE A NEW RX 11/05/12   . potassium chloride (K-DUR,KLOR-CON) 10 MEQ tablet   Oral   Take 10 mEq by mouth as needed.          Marland Kitchen PROAIR HFA 108 (90 BASE) MCG/ACT inhaler                Triage Vitals: BP 170/75  Pulse 72  Temp(Src) 97.5 F (36.4 C) (Oral)  Resp 16  Ht 5' 4.5" (1.638 m)  Wt 200 lb (90.719 kg)  BMI 33.81 kg/m2  SpO2 97% Physical Exam  Nursing note and vitals reviewed. Constitutional: [name] is oriented to person, place, and time. [name] appears well-developed and well-nourished. No distress.  HENT:  Head: Normocephalic and atraumatic.  Right Ear: Hearing normal.  Left Ear: Hearing normal.  Nose: Nose normal.  Mouth/Throat: Oropharynx is clear and moist and mucous membranes are normal.  Eyes: Conjunctivae and EOM are normal. Pupils are equal, round, and reactive to light.  Neck: Normal range of motion. Neck supple.  Cardiovascular: Regular rhythm, S1 normal and S2 normal.  Exam reveals no gallop and no friction rub.   No murmur heard. Pulmonary/Chest: Effort normal and breath sounds normal. No respiratory distress. [name] exhibits no tenderness.  Rhonchi both bases  Abdominal: Soft. Normal appearance and bowel sounds are normal. There is no hepatosplenomegaly. There is no tenderness. There is no  rebound, no guarding, no tenderness at McBurney's point and negative Murphy's sign. No hernia.  Musculoskeletal: Normal range of motion.  Neurological: [name] is alert and oriented to person, place, and time. [name] has normal strength. No cranial nerve deficit or sensory deficit. Coordination normal. GCS eye subscore is 4. GCS verbal subscore is 5. GCS motor subscore is 6.  Skin: Skin is warm, dry and intact. No rash noted. No cyanosis.  Psychiatric: [name] has a normal mood and affect. Her speech is normal and behavior is normal. Thought content normal.    ED Course  Procedures (including critical care time) DIAGNOSTIC STUDIES: Oxygen Saturation is 97% on room air, normal by my interpretation.    COORDINATION OF CARE: At 745 PM Discussed treatment plan with patient which includes CXR. Patient agrees.   Labs Reviewed  BASIC METABOLIC PANEL - Abnormal; Notable for the following:    Glucose, Bld 102 (*)    GFR calc non Af Amer 76 (*)    GFR calc Af Amer 88 (*)    All other components within normal limits  CBC WITH DIFFERENTIAL   Dg Chest 2 View  03/23/2013   *RADIOLOGY REPORT*  Clinical Data: Cough and chest pain.  Short of breath  CHEST - 2 VIEW  Comparison: 01/08/2012  Findings: COPD with hyperinflation of the lungs.  Left lower lobe density unchanged due to scarring and pleural thickening.  Chronic left rib fractures are unchanged.  Right lung is clear.  Cardiac enlargement without heart failure or pneumonia. Port-A-Cath tip in the SVC is unchanged.  IMPRESSION: COPD with chronic scarring in the left base.  No superimposed acute abnormality.   Original Report Authenticated By: Janeece Riggers, M.D.   Diagnosis: Bronchitis  MDM  Patient presents to the ER for evaluation of cough. This has been sick for a couple of days. [name] reports that [name] has had some chills but hasn't documented any fevers. Patient reports that [name] has had thick yellowish-green sputum production. Patient's oxygenation is  excellent here in the ER. [name] was afebrile. [name] appears comfortable. [name] has improved with nebulizer treatments. Chest x-ray shows no evidence of pneumonia. Patient will be treated for bronchitis, is to followup with her doctor the office in one to 2 days, return if her symptoms worsen.  I personally performed the services described in this documentation, which was scribed in my presence. The recorded information has been reviewed and is accurate.    Gilda Crease, MD 03/23/13 2158

## 2013-03-23 NOTE — ED Notes (Signed)
O2 sats holding well 94-95% on r/a. Pt states she'll be fine going home w/o Oxygen. Lives 5 min from here.

## 2013-03-23 NOTE — ED Notes (Signed)
Peripheral site used to IV insertion rather than accessing port at patients preference.

## 2013-03-23 NOTE — ED Notes (Signed)
Cough, with sob Hx lung cancer.  Streaks of blood in sputum. Chills,

## 2013-03-24 ENCOUNTER — Ambulatory Visit (INDEPENDENT_AMBULATORY_CARE_PROVIDER_SITE_OTHER): Payer: Medicare Other | Admitting: Internal Medicine

## 2013-04-13 ENCOUNTER — Encounter (HOSPITAL_COMMUNITY): Payer: Self-pay | Admitting: *Deleted

## 2013-04-13 ENCOUNTER — Emergency Department (HOSPITAL_COMMUNITY): Payer: Medicare Other

## 2013-04-13 ENCOUNTER — Emergency Department (HOSPITAL_COMMUNITY)
Admission: EM | Admit: 2013-04-13 | Discharge: 2013-04-13 | Disposition: A | Discharge status: Home / Self Care | Payer: Medicare Other | Attending: Emergency Medicine | Admitting: Emergency Medicine

## 2013-04-13 DIAGNOSIS — I252 Old myocardial infarction: Secondary | ICD-10-CM | POA: Insufficient documentation

## 2013-04-13 DIAGNOSIS — Z8673 Personal history of transient ischemic attack (TIA), and cerebral infarction without residual deficits: Secondary | ICD-10-CM | POA: Insufficient documentation

## 2013-04-13 DIAGNOSIS — Z8679 Personal history of other diseases of the circulatory system: Secondary | ICD-10-CM | POA: Insufficient documentation

## 2013-04-13 DIAGNOSIS — I87303 Chronic venous hypertension (idiopathic) without complications of bilateral lower extremity: Secondary | ICD-10-CM

## 2013-04-13 DIAGNOSIS — J441 Chronic obstructive pulmonary disease with (acute) exacerbation: Secondary | ICD-10-CM | POA: Insufficient documentation

## 2013-04-13 DIAGNOSIS — Z8739 Personal history of other diseases of the musculoskeletal system and connective tissue: Secondary | ICD-10-CM | POA: Insufficient documentation

## 2013-04-13 DIAGNOSIS — Z9981 Dependence on supplemental oxygen: Secondary | ICD-10-CM | POA: Insufficient documentation

## 2013-04-13 DIAGNOSIS — I2510 Atherosclerotic heart disease of native coronary artery without angina pectoris: Secondary | ICD-10-CM | POA: Insufficient documentation

## 2013-04-13 DIAGNOSIS — Z862 Personal history of diseases of the blood and blood-forming organs and certain disorders involving the immune mechanism: Secondary | ICD-10-CM | POA: Insufficient documentation

## 2013-04-13 DIAGNOSIS — Z8639 Personal history of other endocrine, nutritional and metabolic disease: Secondary | ICD-10-CM | POA: Insufficient documentation

## 2013-04-13 DIAGNOSIS — Z87891 Personal history of nicotine dependence: Secondary | ICD-10-CM | POA: Insufficient documentation

## 2013-04-13 DIAGNOSIS — Z79899 Other long term (current) drug therapy: Secondary | ICD-10-CM | POA: Insufficient documentation

## 2013-04-13 DIAGNOSIS — I10 Essential (primary) hypertension: Secondary | ICD-10-CM | POA: Insufficient documentation

## 2013-04-13 DIAGNOSIS — Z880 Allergy status to penicillin: Secondary | ICD-10-CM | POA: Insufficient documentation

## 2013-04-13 DIAGNOSIS — I4891 Unspecified atrial fibrillation: Secondary | ICD-10-CM | POA: Insufficient documentation

## 2013-04-13 DIAGNOSIS — Z8719 Personal history of other diseases of the digestive system: Secondary | ICD-10-CM | POA: Insufficient documentation

## 2013-04-13 DIAGNOSIS — M1990 Unspecified osteoarthritis, unspecified site: Secondary | ICD-10-CM | POA: Insufficient documentation

## 2013-04-13 DIAGNOSIS — I5041 Acute combined systolic (congestive) and diastolic (congestive) heart failure: Secondary | ICD-10-CM

## 2013-04-13 DIAGNOSIS — K219 Gastro-esophageal reflux disease without esophagitis: Secondary | ICD-10-CM | POA: Insufficient documentation

## 2013-04-13 DIAGNOSIS — I87309 Chronic venous hypertension (idiopathic) without complications of unspecified lower extremity: Secondary | ICD-10-CM | POA: Insufficient documentation

## 2013-04-13 DIAGNOSIS — Z87448 Personal history of other diseases of urinary system: Secondary | ICD-10-CM | POA: Insufficient documentation

## 2013-04-13 DIAGNOSIS — F411 Generalized anxiety disorder: Secondary | ICD-10-CM | POA: Insufficient documentation

## 2013-04-13 DIAGNOSIS — Z7982 Long term (current) use of aspirin: Secondary | ICD-10-CM | POA: Insufficient documentation

## 2013-04-13 DIAGNOSIS — Z85118 Personal history of other malignant neoplasm of bronchus and lung: Secondary | ICD-10-CM | POA: Insufficient documentation

## 2013-04-13 DIAGNOSIS — Z8529 Personal history of malignant neoplasm of other respiratory and intrathoracic organs: Secondary | ICD-10-CM | POA: Insufficient documentation

## 2013-04-13 LAB — URINALYSIS, ROUTINE W REFLEX MICROSCOPIC
Glucose, UA: NEGATIVE mg/dL
Leukocytes, UA: NEGATIVE
Protein, ur: NEGATIVE mg/dL
Specific Gravity, Urine: 1.01 (ref 1.005–1.030)
pH: 7 (ref 5.0–8.0)

## 2013-04-13 LAB — CBC WITH DIFFERENTIAL/PLATELET
Hemoglobin: 14.2 g/dL (ref 12.0–15.0)
Lymphocytes Relative: 19 % (ref 12–46)
Lymphs Abs: 1.1 10*3/uL (ref 0.7–4.0)
Neutrophils Relative %: 72 % (ref 43–77)
Platelets: 238 10*3/uL (ref 150–400)
RBC: 4.56 MIL/uL (ref 3.87–5.11)
WBC: 6 10*3/uL (ref 4.0–10.5)

## 2013-04-13 LAB — BASIC METABOLIC PANEL
CO2: 34 mEq/L — ABNORMAL HIGH (ref 19–32)
Chloride: 94 mEq/L — ABNORMAL LOW (ref 96–112)
Glucose, Bld: 100 mg/dL — ABNORMAL HIGH (ref 70–99)
Potassium: 3.5 mEq/L (ref 3.5–5.1)
Sodium: 133 mEq/L — ABNORMAL LOW (ref 135–145)

## 2013-04-13 MED ORDER — ALBUTEROL SULFATE (5 MG/ML) 0.5% IN NEBU
2.5000 mg | INHALATION_SOLUTION | Freq: Once | RESPIRATORY_TRACT | Status: AC
  Administered 2013-04-13: 2.5 mg via RESPIRATORY_TRACT
  Filled 2013-04-13: qty 0.5

## 2013-04-13 MED ORDER — FUROSEMIDE 10 MG/ML IJ SOLN
40.0000 mg | INTRAMUSCULAR | Status: AC
  Administered 2013-04-13: 40 mg via INTRAVENOUS
  Filled 2013-04-13: qty 4

## 2013-04-13 NOTE — ED Notes (Signed)
Pt states been having SOB & right sided chest pain. The SOB started 3 days ago. Pt take a fluid pill but not sure what one.

## 2013-04-13 NOTE — ED Provider Notes (Addendum)
History    CSN: [id number] Arrival date & time 04/13/13  4098  First MD Initiated Contact with Patient 04/13/13 0113     Chief Complaint  Patient presents with  . Shortness of Breath  . Chest Pain   (Consider location/radiation/quality/duration/timing/severity/associated sxs/prior Treatment) HPI HPI Comments: [name] is a 79 y.o. female  With a h/o COPD, Lung CA who presents to the Emergency Department complaining of shortness of breath and right sided chest pain that has been present for 3 days. She is awakened the last three nights with shortness of breath that requires that she get up to feel better. She uses O2 at night and inhaler during he day. Her lung CA is [facility] by [facility].   PCP Dr. Ouida Sills Past Medical History  Diagnosis Date  . Essential hypertension, benign   . Crohn's disease   . Spinal stenosis   . PSVT (paroxysmal supraventricular tachycardia)   . History of TIAs   . GERD (gastroesophageal reflux disease)   . Carotid artery disease     RICA 50-69% 12/09  . PONV (postoperative nausea and vomiting)     years ago had N/V, but not recently  . Myocardial infarction   . COPD (chronic obstructive pulmonary disease)   . Thyroid mass   . Incontinence of urine   . Anxiety   . Arthritis   . Atrial fibrillation     Postoperative - suboptimal Coumadin candidate  . CAD (coronary artery disease), native coronary artery   . Port-a-cath in place 01/22/2012  . 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 Carcinoma of tonsillar pillars (anterior) (posterior)     XRT and resection   Past Surgical History  Procedure Laterality Date  . Left thoracotomy with wedge resection  2008    Left lower lobe  . Right tonsillectomy  2005  . Left thyroidectomy  2006  . Left breast biopsy  2011  . US echocardiography    . Cholecystectomy    . Cardiovascular stress test    . Abdominal hysterectomy    .  Thyroidectomy  08/30/2011    Procedure: THYROIDECTOMY;  Surgeon: Leonette Most;  Location: [facility] OR;  Service: ENT;  Laterality: Right;   Family History  Problem Relation Age of Onset  . Coronary artery disease Father     Died age 45  . Dementia Mother    History  Substance Use Topics  . Smoking status: Former Smoker    Types: Cigarettes    Quit date: 10/20/1993  . Smokeless tobacco: Never Used  . Alcohol Use: No   OB History   Grav Para Term Preterm Abortions TAB SAB Ect Mult Living                 Review of Systems  Constitutional: Negative for fever.       10 Systems reviewed and are negative for acute change except as noted in the HPI.  HENT: Negative for congestion.   Eyes: Negative for discharge and redness.  Respiratory: Positive for shortness of breath. Negative for cough.   Cardiovascular: Negative for chest pain.  Gastrointestinal: Negative for vomiting and abdominal pain.  Musculoskeletal: Negative for back pain.  Skin: Negative for rash.  Neurological: Negative for syncope, numbness and headaches.  Psychiatric/Behavioral:       No behavior change.    Allergies  Codeine; Demerol; Morphine and related; and Penicillins  Home Medications  Current Outpatient Rx  Name  Route  Sig  Dispense  Refill  . ALPRAZolam (XANAX) 0.25 MG tablet   Oral   Take 0.25 mg by mouth at bedtime as needed. For sleep.         Marland Kitchen aspirin EC 81 MG tablet   Oral   Take 81 mg by mouth daily.         Marland Kitchen atenolol (TENORMIN) 25 MG tablet   Oral   Take 25 mg by mouth every morning.         . calcium carbonate (TUMS - DOSED IN MG ELEMENTAL CALCIUM) 500 MG chewable tablet   Oral   Chew 1 tablet by mouth 2 (two) times daily as needed for heartburn.          . fluticasone (FLONASE) 50 MCG/ACT nasal spray   Nasal   Place 2 sprays into the nose daily.           . furosemide (LASIX) 20 MG tablet   Oral   Take 20 mg by mouth daily as needed (for swelling).         Marland Kitchen  levothyroxine (SYNTHROID, LEVOTHROID) 75 MCG tablet   Oral   Take 75 mcg by mouth every morning.         . lidocaine (LIDODERM) 5 %   Transdermal   Place 1 patch onto the skin daily as needed (for pain). Remove & Discard patch within 12 hours or as directed by MD. Pain.         . mesalamine (PENTASA) 250 MG CR capsule   Oral   Take 1,000 mg by mouth 4 (four) times daily.         . OMEGA 3 1000 MG CAPS   Oral   Take 1,000 mg by mouth daily.          . pantoprazole (PROTONIX) 40 MG tablet   Oral   Take 40 mg by mouth every morning.         . potassium chloride (K-DUR,KLOR-CON) 10 MEQ tablet   Oral   Take 10 mEq by mouth daily as needed (when taking Lasix (Furosemide)).          Marland Kitchen PROAIR HFA 108 (90 BASE) MCG/ACT inhaler   Inhalation   Inhale 2 puffs into the lungs every 6 (six) hours as needed for wheezing or shortness of breath.          . levofloxacin (LEVAQUIN) 750 MG tablet   Oral   Take 1 tablet (750 mg total) by mouth daily. X 7 days   7 tablet   0   . predniSONE (DELTASONE) 20 MG tablet      3 tabs po day one, then 2 po daily x 4 days   11 tablet   0    BP 184/74  Pulse 65  Temp(Src) 97.8 F (36.6 C) (Oral)  Resp 20  Ht 5\' 4"  (1.626 m)  Wt 200 lb (90.719 kg)  BMI 34.31 kg/m2  SpO2 97% Physical Exam  Nursing note and vitals reviewed. Constitutional: She appears well-developed and well-nourished.  Awake, alert, nontoxic appearance.  HENT:  Head: Normocephalic and atraumatic.  Eyes: EOM are normal. Pupils are equal, round, and reactive to light.  Neck: Normal range of motion. Neck supple.  Pulmonary/Chest: Effort normal. She has rales. She exhibits no tenderness.  Abdominal: Soft. There is no tenderness. There is no rebound.  Musculoskeletal: She exhibits no tenderness.  Baseline ROM, no obvious new focal weakness.  Neurological:  Mental status and motor strength appears baseline for patient and situation.  Skin: No rash noted.   Psychiatric: She has a normal mood and affect.    ED Course  Procedures (including critical care time) Results for orders placed during the hospital encounter of 04/13/13  CBC WITH DIFFERENTIAL      Result Value Range   WBC 6.0  4.0 - 10.5 K/uL   RBC 4.56  3.87 - 5.11 MIL/uL   Hemoglobin 14.2  12.0 - 15.0 g/dL   HCT 16.1  09.6 - 04.5 %   MCV 93.0  78.0 - 100.0 fL   MCH 31.1  26.0 - 34.0 pg   MCHC 33.5  30.0 - 36.0 g/dL   RDW 40.9  81.1 - 91.4 %   Platelets 238  150 - 400 K/uL   Neutrophils Relative % 72  43 - 77 %   Neutro Abs 4.3  1.7 - 7.7 K/uL   Lymphocytes Relative 19  12 - 46 %   Lymphs Abs 1.1  0.7 - 4.0 K/uL   Monocytes Relative 8  3 - 12 %   Monocytes Absolute 0.5  0.1 - 1.0 K/uL   Eosinophils Relative 2  0 - 5 %   Eosinophils Absolute 0.1  0.0 - 0.7 K/uL   Basophils Relative 0  0 - 1 %   Basophils Absolute 0.0  0.0 - 0.1 K/uL  BASIC METABOLIC PANEL      Result Value Range   Sodium 133 (*) 135 - 145 mEq/L   Potassium 3.5  3.5 - 5.1 mEq/L   Chloride 94 (*) 96 - 112 mEq/L   CO2 34 (*) 19 - 32 mEq/L   Glucose, Bld 100 (*) 70 - 99 mg/dL   BUN 11  6 - 23 mg/dL   Creatinine, Ser 7.82  0.50 - 1.10 mg/dL   Calcium 9.2  8.4 - 95.6 mg/dL   GFR calc non Af Amer 77 (*) >90 mL/min   GFR calc Af Amer 89 (*) >90 mL/min  PRO B NATRIURETIC PEPTIDE      Result Value Range   Pro B Natriuretic peptide (BNP) 1730.0 (*) 0 - 450 pg/mL  URINALYSIS, ROUTINE W REFLEX MICROSCOPIC      Result Value Range   Color, Urine YELLOW  YELLOW   APPearance CLEAR  CLEAR   Specific Gravity, Urine 1.010  1.005 - 1.030   pH 7.0  5.0 - 8.0   Glucose, UA NEGATIVE  NEGATIVE mg/dL   Hgb urine dipstick NEGATIVE  NEGATIVE   Bilirubin Urine NEGATIVE  NEGATIVE   Ketones, ur NEGATIVE  NEGATIVE mg/dL   Protein, ur NEGATIVE  NEGATIVE mg/dL   Urobilinogen, UA 0.2  0.0 - 1.0 mg/dL   Nitrite NEGATIVE  NEGATIVE   Leukocytes, UA NEGATIVE  NEGATIVE   Dg Chest Portable 1 View  04/13/2013   *RADIOLOGY REPORT*   Clinical Data: Shortness of breath and right-sided chest pain.  PORTABLE CHEST - 1 VIEW  Comparison: 03/23/2013.  Findings: Right subclavian portacatheter, similar to prior with tip near the SVC origin.  No acute osseous findings.  There is a remote left posterior sixth rib fracture.  Similar appearance of scarring in the left mid lung, with pleural thickening at the left base.  Chronic cardiopericardial enlargement. Venous engorgement and cephalized pulmonary blood flow.  IMPRESSION: Cardiomegaly and mild pulmonary edema.   Original Report Authenticated By: Tiburcio Pea    Date: 04/13/2013   0051  Rate: 66  Rhythm: normal sinus rhythm  QRS Axis: normal  Intervals: normal  ST/T Wave abnormalities: normal  Conduction Disutrbances:none  Narrative Interpretation:   Old EKG Reviewed: unchanged c/w 01/07/02  0258 Patient has diuresed 1000 cc and she feel better. Will discharge home.  MDM  Patient with shortness of breath x 3 days. Chest xray shows mild CHF. Given lasix with good response. Pt stable in ED with no significant deterioration in condition.The patient appears reasonably screened and/or stabilized for discharge and I doubt any other medical condition or other [facility] requiring further screening, evaluation, or treatment in the ED at this time prior to discharge.  MDM Reviewed: nursing note and vitals Interpretation: labs, ECG and x-ray      Nicoletta Dress. Colon Branch, MD 04/13/13 0300  Nicoletta Dress. Colon Branch, MD 04/22/13 [phone]

## 2013-04-13 NOTE — ED Notes (Signed)
Pt alert & oriented x4, stable gait. Patient  given discharge instructions, paperwork & prescription(s). Patient verbalized understanding. Pt left department w/ no further questions. 

## 2013-04-15 ENCOUNTER — Telehealth: Payer: Self-pay | Admitting: Cardiology

## 2013-04-15 DIAGNOSIS — R609 Edema, unspecified: Secondary | ICD-10-CM

## 2013-04-15 DIAGNOSIS — R0602 Shortness of breath: Secondary | ICD-10-CM

## 2013-04-15 NOTE — Telephone Encounter (Signed)
Pt noted 3 weeks ago she went to ED for bronchitis, was given ABT/prednisone, took for 7 days per when lying down she had a hard time breathing, pt went back to [facility] this past Sunday via EMS, was given shot of Lasix 40mg , and she had taken 20mg  as well via pill that morning, pt noted she felt better after her treatment, pt was informed that she now has CHF per edema of lower extremities, pt concerned with that discharge dx, pt wants to clarify that lasix 20mg  daily is the best dose for her per called Dr Ouida Sills office on Monday 04-13-13 per SOB and swelling and was advised to take 40mg  she did this Monday, Tuesday and today and notes she last urinated this am at 8am and notes a significant amount after drinking coffee, notes her feet and legs swelling more today than they were yesterday, also notes the SOB is the same that she notes can not lay down without causing problems with breathing also notes hernia as well that maybe causing problems and sometimes while talking pt has not checked BP today per no way of checking, pt denies chest pains, pt declined apt with [facility] PA today, please advise

## 2013-04-15 NOTE — Telephone Encounter (Signed)
Patient states that she went to ER and they raised her dose on her fluid pill.  Would like to speak with nurse about this. / tgs

## 2013-04-16 ENCOUNTER — Other Ambulatory Visit: Payer: Self-pay | Admitting: *Deleted

## 2013-04-16 ENCOUNTER — Telehealth: Payer: Self-pay | Admitting: *Deleted

## 2013-04-16 ENCOUNTER — Ambulatory Visit (HOSPITAL_COMMUNITY)
Admission: RE | Admit: 2013-04-16 | Discharge: 2013-04-16 | Disposition: A | Discharge status: Home / Self Care | Payer: Medicare Other | Source: Ambulatory Visit | Attending: Cardiology | Admitting: Cardiology

## 2013-04-16 DIAGNOSIS — J449 Chronic obstructive pulmonary disease, unspecified: Secondary | ICD-10-CM | POA: Insufficient documentation

## 2013-04-16 DIAGNOSIS — R609 Edema, unspecified: Secondary | ICD-10-CM

## 2013-04-16 DIAGNOSIS — Z6834 Body mass index (BMI) 34.0-34.9, adult: Secondary | ICD-10-CM | POA: Insufficient documentation

## 2013-04-16 DIAGNOSIS — R0609 Other forms of dyspnea: Secondary | ICD-10-CM | POA: Insufficient documentation

## 2013-04-16 DIAGNOSIS — I10 Essential (primary) hypertension: Secondary | ICD-10-CM | POA: Insufficient documentation

## 2013-04-16 DIAGNOSIS — J4489 Other specified chronic obstructive pulmonary disease: Secondary | ICD-10-CM | POA: Insufficient documentation

## 2013-04-16 DIAGNOSIS — I2510 Atherosclerotic heart disease of native coronary artery without angina pectoris: Secondary | ICD-10-CM | POA: Insufficient documentation

## 2013-04-16 DIAGNOSIS — R0989 Other specified symptoms and signs involving the circulatory and respiratory systems: Secondary | ICD-10-CM | POA: Insufficient documentation

## 2013-04-16 DIAGNOSIS — I517 Cardiomegaly: Secondary | ICD-10-CM

## 2013-04-16 DIAGNOSIS — R0602 Shortness of breath: Secondary | ICD-10-CM

## 2013-04-16 MED ORDER — FUROSEMIDE 40 MG PO TABS: 40.0000 mg | ORAL_TABLET | Freq: Every day | ORAL | Status: DC

## 2013-04-16 MED ORDER — POTASSIUM CHLORIDE ER 10 MEQ PO TBCR: 10.0000 meq | EXTENDED_RELEASE_TABLET | Freq: Every day | ORAL | Status: AC

## 2013-04-16 NOTE — Telephone Encounter (Signed)
This was sent to me yesterday, I was out of the office. Reviewed telephone note. Would suggest that we get a followup echocardiogram on [name] to reassess LVEF. It had actually normalized by last assessment in 2013. It still could be that she needs a higher dose of Lasix, but would like to see what the echocardiogram looks like first.

## 2013-04-16 NOTE — Telephone Encounter (Signed)
Spoke to pt to advise results/instructions. Pt understood. NOTED IN RESULTS NOTATIONS

## 2013-04-16 NOTE — Telephone Encounter (Signed)
Spoke to pt to advise results/instructions. Pt understood. Advised we will try to get her an apt asap, pt requesting to have O2 during Echo per hard to breathe while lying down, advised we will get this ordered for her, Dr Diona Browner gave verbal order for oxygen to be given to pt via nasal canula 2L for her during the test, Karena Addison advised she can work the pt in, apt scheduled for 10:30am today, pt informed, this nurse spoke to DC to advise order has been given to administer pt with O2 during her test, DC understood and pt will get a ride to [facility] asap to have test performed, we will call her with the results

## 2013-04-16 NOTE — Progress Notes (Signed)
*  PRELIMINARY RESULTS* Echocardiogram 2D Echocardiogram has been performed.  [name] 04/16/2013, 11:44 AM

## 2013-04-16 NOTE — Telephone Encounter (Signed)
FYI: .left message to have patient return my call to advise if she has any worsening sxs to go to the ED, unable to locate pt VS for Echo performed today. Also left message to advise the result will be read as soon as possible however we are currently seeing pt at this time in clinic

## 2013-04-16 NOTE — Telephone Encounter (Signed)
Verlon Au PA with [place] was calling to check on this patient from being discharged. She is just relaying that the patient is concerned about her echo that was done this morning and would like result ASAP.

## 2013-04-16 NOTE — Telephone Encounter (Signed)
Pt called again regarding her test results for her Echo performed today, reiterated this process takes longer than a few minutes to process however DR SM just finished with his last pt of the day in clinic and is currently seeing pt in the hospital, DR SM has been made aware of pt concern per another open phone notation from today has been sent to DR [facility] during clinic, pt noted no SOB/wheezing or difficulty speaking while on the phone with this nurse, again reiterate If your signs and symptoms worsen please seek medical attention in your local Emergency Room, do not attempt to drive yourself per further endangerment, call 911 for transport or have someone drive you to the ER as soon as possible. Pt understood and will contact with results asap

## 2013-04-17 ENCOUNTER — Other Ambulatory Visit (HOSPITAL_COMMUNITY): Payer: Medicare Other

## 2013-04-21 ENCOUNTER — Encounter (HOSPITAL_COMMUNITY): Payer: Medicare Other

## 2013-04-21 ENCOUNTER — Telehealth: Payer: Self-pay | Admitting: Cardiology

## 2013-04-21 NOTE — Telephone Encounter (Signed)
I am not sure what to make of these symptoms as currently described. Reassuring to note that she is not short of breath however. It could be that some of her symptoms are noncardiac in etiology, however if she is tolerating the Lasix at 40 mg daily, would continue this for now.

## 2013-04-21 NOTE — Telephone Encounter (Signed)
Called pt,  she states that she is taking 40 mg  of Lasix q am for 5 days per  Current sxs  palpitations, restless,confusion, Still has lower extremity swelling.Denies SOB unable to take home BP, Please advise.

## 2013-04-21 NOTE — Telephone Encounter (Signed)
Spoke to patient concerning lab/test results/instructions from provider. Patient understood.   Pt informed this nurse she will take 40 mg daily; however chooses to cut in half and take 20 mg in am and 20 mg at night. Pt will call back to our office with any further concerns if neccessary.

## 2013-04-21 NOTE — Telephone Encounter (Signed)
Patient would like return phone call regarding new medication.  States that she can not take it all at one time. / tgs

## 2013-04-22 ENCOUNTER — Encounter (INDEPENDENT_AMBULATORY_CARE_PROVIDER_SITE_OTHER): Payer: Self-pay | Admitting: Internal Medicine

## 2013-04-22 ENCOUNTER — Encounter (INDEPENDENT_AMBULATORY_CARE_PROVIDER_SITE_OTHER): Payer: Self-pay | Admitting: *Deleted

## 2013-04-22 ENCOUNTER — Other Ambulatory Visit (INDEPENDENT_AMBULATORY_CARE_PROVIDER_SITE_OTHER): Payer: Self-pay | Admitting: *Deleted

## 2013-04-22 ENCOUNTER — Ambulatory Visit (INDEPENDENT_AMBULATORY_CARE_PROVIDER_SITE_OTHER): Payer: Medicare Other | Admitting: Internal Medicine

## 2013-04-22 ENCOUNTER — Encounter (HOSPITAL_COMMUNITY): Payer: Self-pay | Admitting: Pharmacy Technician

## 2013-04-22 VITALS — BP 200/78 | HR 60 | Temp 97.9°F | Ht 64.0 in | Wt 198.8 lb

## 2013-04-22 DIAGNOSIS — R1310 Dysphagia, unspecified: Secondary | ICD-10-CM

## 2013-04-22 NOTE — Patient Instructions (Addendum)
EGD/Ed 

## 2013-04-22 NOTE — Progress Notes (Signed)
Subjective:     Patient ID: [name], female   DOB: Jun 04, 1927, 79 y.o.   MRN: [id number]  HPI Presents today with c/o that foods are lodging in her esophagus. Pills are also lodging. She has had dysphagia for about 2 months.  She cannot eat chicken, rice, or cornbread.  Hx of dysphagia and has undergone a EGD/ED in the past (see below).  05/16/2012 EGD/ED: dysphagia Impression:  Small sliding hiatal hernia with changes of esophagitis at GE junction.  Normal examination of stomach first and second part of the duodenum.  Esophagus dilated by passing 56 French Maloney dilator but no mucosal disruption induced.   She has a hx of Crohn's. She has a BM about every 3 days. No rectal bleeding or melena. Presently taking Pentasa for her Crohn's. Diagnosed with Crohn's in ? 1991.  Her last flare was about a year ago.   Hx of tonsillar cancer   CBC    Component Value Date/Time   WBC 6.0 04/13/2013 0055   RBC 4.56 04/13/2013 0055   HGB 14.2 04/13/2013 0055   HCT 42.4 04/13/2013 0055   PLT 238 04/13/2013 0055   MCV 93.0 04/13/2013 0055   MCH 31.1 04/13/2013 0055   MCHC 33.5 04/13/2013 0055   RDW 14.4 04/13/2013 0055   LYMPHSABS 1.1 04/13/2013 0055   MONOABS 0.5 04/13/2013 0055   EOSABS 0.1 04/13/2013 0055   BASOSABS 0.0 04/13/2013 0055      Review of Systems see hpi Current Outpatient Prescriptions  Medication Sig Dispense Refill  . ALPRAZolam (XANAX) 0.25 MG tablet Take 0.25 mg by mouth at bedtime as needed. For sleep.      Marland Kitchen atenolol (TENORMIN) 25 MG tablet Take 25 mg by mouth every morning.      . calcium carbonate (TUMS - DOSED IN MG ELEMENTAL CALCIUM) 500 MG chewable tablet Chew 1 tablet by mouth 2 (two) times daily as needed for heartburn.       . fluticasone (FLONASE) 50 MCG/ACT nasal spray Place 2 sprays into the nose daily.        . furosemide (LASIX) 40 MG tablet Take 1 tablet (40 mg total) by mouth daily.  90 tablet  1  . levothyroxine (SYNTHROID, LEVOTHROID) 75 MCG tablet Take 75  mcg by mouth every morning.      . lidocaine (LIDODERM) 5 % Place 1 patch onto the skin daily as needed (for pain). Remove & Discard patch within 12 hours or as directed by MD. Pain.      . mesalamine (PENTASA) 250 MG CR capsule Take 1,000 mg by mouth 4 (four) times daily.      . OMEGA 3 1000 MG CAPS Take 1,000 mg by mouth daily.       . pantoprazole (PROTONIX) 40 MG tablet Take 40 mg by mouth every morning.      . potassium chloride (K-DUR) 10 MEQ tablet Take 1 tablet (10 mEq total) by mouth daily.  90 tablet  1  . PROAIR HFA 108 (90 BASE) MCG/ACT inhaler Inhale 2 puffs into the lungs every 6 (six) hours as needed for wheezing or shortness of breath.        No current facility-administered medications for this visit.   Past Medical History  Diagnosis Date  . Essential hypertension, benign   . Crohn's disease   . Spinal stenosis   . PSVT (paroxysmal supraventricular tachycardia)   . History of TIAs   . GERD (gastroesophageal reflux disease)   . Carotid  artery disease     RICA 50-69% 12/09  . PONV (postoperative nausea and vomiting)     years ago had N/V, but not recently  . Myocardial infarction   . COPD (chronic obstructive pulmonary disease)   . Thyroid mass   . Incontinence of urine   . Anxiety   . Arthritis   . Atrial fibrillation     Postoperative - suboptimal Coumadin candidate  . CAD (coronary artery disease), native coronary artery   . Port-a-cath in place 01/22/2012  . 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 Carcinoma of tonsillar pillars (anterior) (posterior)     XRT and resection   Allergies  Allergen Reactions  . Codeine Nausea And Vomiting  . Demerol Other (See Comments)    Blood pressure  . Morphine And Related Nausea And Vomiting  . Penicillins Hives and Swelling   Past Surgical History  Procedure Laterality Date  . Left thoracotomy with wedge resection  2008    Left lower lobe  . Right tonsillectomy  2005   . Left thyroidectomy  2006  . Left breast biopsy  2011  . US echocardiography    . Cholecystectomy    . Cardiovascular stress test    . Abdominal hysterectomy    . Thyroidectomy  08/30/2011    Procedure: THYROIDECTOMY;  Surgeon: Leonette Most;  Location: [facility] OR;  Service: ENT;  Laterality: Right;        Objective:   Physical Exam  Filed Vitals:   04/22/13 1129  BP: 200/78  Pulse: 60  Temp: 97.9 F (36.6 C)  Height: 5\' 4"  (1.626 m)  Weight: 198 lb 12.8 oz (90.175 kg)   Alert and oriented. Skin warm and dry. Oral mucosa is moist.   . Sclera anicteric, conjunctivae is pink. Thyroid not enlarged. No cervical lymphadenopathy. Lungs clear. Heart regular rate and rhythm.  Abdomen is soft. Bowel sounds are positive. No hepatomegaly. No abdominal masses felt. No tenderness.  No edema to lower extremities. Patient is alert and oriented.     Assessment:   Dysphagia to solids. Stricture needs to be ruled out.   Crohn's disease. She seems to be in remission at this time.  Plan:    Continue present medications. After procedure will get a CRP on her for her Crohns EGD/ED with Dr. Karilyn Cota.

## 2013-04-28 ENCOUNTER — Encounter (HOSPITAL_COMMUNITY): Payer: Medicare Other

## 2013-04-29 ENCOUNTER — Encounter (HOSPITAL_COMMUNITY): Payer: Medicare Other | Attending: Oncology

## 2013-04-29 DIAGNOSIS — C760 Malignant neoplasm of head, face and neck: Secondary | ICD-10-CM

## 2013-04-29 DIAGNOSIS — Z95828 Presence of other vascular implants and grafts: Secondary | ICD-10-CM

## 2013-04-29 DIAGNOSIS — Z9889 Other specified postprocedural states: Secondary | ICD-10-CM | POA: Insufficient documentation

## 2013-04-29 DIAGNOSIS — Z452 Encounter for adjustment and management of vascular access device: Secondary | ICD-10-CM

## 2013-04-29 MED ORDER — HEPARIN SOD (PORK) LOCK FLUSH 100 UNIT/ML IV SOLN
INTRAVENOUS | Status: AC
Start: 2013-04-29 — End: 2013-04-29
  Filled 2013-04-29: qty 5

## 2013-04-29 MED ORDER — SODIUM CHLORIDE 0.9 % IJ SOLN
10.0000 mL | INTRAMUSCULAR | Status: DC | PRN
  Administered 2013-04-29: 10 mL via INTRAVENOUS
  Filled 2013-04-29: qty 10

## 2013-04-29 MED ORDER — HEPARIN SOD (PORK) LOCK FLUSH 100 UNIT/ML IV SOLN
500.0000 [IU] | Freq: Once | INTRAVENOUS | Status: AC
  Administered 2013-04-29: 500 [IU] via INTRAVENOUS
  Filled 2013-04-29: qty 5

## 2013-04-29 NOTE — Progress Notes (Signed)
Verneal K Meiklejohn presented for Portacath access and flush. Proper placement of portacath confirmed by CXR. Portacath located right chest wall accessed with  H 20 needle. Good blood return present. Portacath flushed with 20ml NS and 500U/5ml Heparin and needle removed intact. Procedure without incident. Patient tolerated procedure well.   

## 2013-05-06 ENCOUNTER — Encounter (HOSPITAL_COMMUNITY): Payer: Self-pay | Admitting: *Deleted

## 2013-05-06 ENCOUNTER — Ambulatory Visit (HOSPITAL_COMMUNITY)
Admission: RE | Admit: 2013-05-06 | Discharge: 2013-05-06 | Disposition: A | Discharge status: Home / Self Care | Payer: Medicare Other | Source: Ambulatory Visit | Attending: Internal Medicine | Admitting: Internal Medicine

## 2013-05-06 ENCOUNTER — Encounter (HOSPITAL_COMMUNITY)
Admission: RE | Disposition: A | Discharge status: Home / Self Care | Payer: Self-pay | Source: Ambulatory Visit | Attending: Internal Medicine

## 2013-05-06 DIAGNOSIS — J4489 Other specified chronic obstructive pulmonary disease: Secondary | ICD-10-CM | POA: Insufficient documentation

## 2013-05-06 DIAGNOSIS — K219 Gastro-esophageal reflux disease without esophagitis: Secondary | ICD-10-CM

## 2013-05-06 DIAGNOSIS — I10 Essential (primary) hypertension: Secondary | ICD-10-CM | POA: Insufficient documentation

## 2013-05-06 DIAGNOSIS — R1310 Dysphagia, unspecified: Secondary | ICD-10-CM

## 2013-05-06 DIAGNOSIS — J449 Chronic obstructive pulmonary disease, unspecified: Secondary | ICD-10-CM | POA: Insufficient documentation

## 2013-05-06 DIAGNOSIS — K2960 Other gastritis without bleeding: Secondary | ICD-10-CM

## 2013-05-06 DIAGNOSIS — K449 Diaphragmatic hernia without obstruction or gangrene: Secondary | ICD-10-CM

## 2013-05-06 HISTORY — PX: ESOPHAGOGASTRODUODENOSCOPY (EGD) WITH ESOPHAGEAL DILATION: SHX5812

## 2013-05-06 SURGERY — ESOPHAGOGASTRODUODENOSCOPY (EGD) WITH ESOPHAGEAL DILATION
Anesthesia: Moderate Sedation

## 2013-05-06 MED ORDER — BUTAMBEN-TETRACAINE-BENZOCAINE 2-2-14 % EX AERO
INHALATION_SPRAY | CUTANEOUS | Status: DC | PRN
  Administered 2013-05-06: 2 via TOPICAL

## 2013-05-06 MED ORDER — MIDAZOLAM HCL 5 MG/5ML IJ SOLN
INTRAMUSCULAR | Status: DC | PRN
  Administered 2013-05-06: 1 mg via INTRAVENOUS
  Administered 2013-05-06: 2 mg via INTRAVENOUS

## 2013-05-06 MED ORDER — MIDAZOLAM HCL 5 MG/5ML IJ SOLN
INTRAMUSCULAR | Status: AC
  Filled 2013-05-06: qty 10

## 2013-05-06 MED ORDER — STERILE WATER FOR IRRIGATION IR SOLN
Status: DC | PRN
  Administered 2013-05-06: 15:00:00

## 2013-05-06 MED ORDER — FENTANYL CITRATE 0.05 MG/ML IJ SOLN
INTRAMUSCULAR | Status: AC
  Filled 2013-05-06: qty 2

## 2013-05-06 MED ORDER — SODIUM CHLORIDE 0.9 % IV SOLN
INTRAVENOUS | Status: DC
  Administered 2013-05-06: 14:00:00 via INTRAVENOUS

## 2013-05-06 MED ORDER — FENTANYL CITRATE 0.05 MG/ML IJ SOLN
INTRAMUSCULAR | Status: DC | PRN
  Administered 2013-05-06 (×2): 25 ug via INTRAVENOUS

## 2013-05-06 NOTE — Progress Notes (Signed)
Pt's 02 sats decreased to 59 for a very brief time (few seconds); nonrebreather mask placed on pt with o2 @ 15 lpm for 3 minutes; replaced nasal canula to pt @ 3 lpm; 02 stabized at 89 - 92.

## 2013-05-06 NOTE — Op Note (Signed)
EGD PROCEDURE REPORT  PATIENT:  [name]  MR#:  [id number] Birthdate:  [birth year], 79 y.o., female Endoscopist:  Dr. Malissa Hippo, MD Referred By:  Dr. Carylon Perches, MD  Procedure Date: 05/06/2013  Procedure:   EGD with ED.  Indications:  Patient is a 3-year-old Caucasian female with multiple medical problems including GERD who presents with dysphagia to solids and pills. Her last dilation was one year ago and she had good response l           Informed Consent:  The risks, benefits, alternatives & imponderables which include, but are not limited to, bleeding, infection, perforation, drug reaction and potential missed lesion have been reviewed.  The potential for biopsy, lesion removal, esophageal dilation, etc. have also been discussed.  Questions have been answered.  All parties agreeable.  Please see history & physical in medical record for more information.  Medications:  Demerol 50 mg IV Versed 3 mg IV Cetacaine spray topically for oropharyngeal anesthesia  Description of procedure:  The endoscope was introduced through the mouth and advanced to the second portion of the duodenum without difficulty or limitations. The mucosal surfaces were surveyed very carefully during advancement of the scope and upon withdrawal.  Findings:  Esophagus:  Mucosa of the esophagus was normal. GE junction was unremarkable without ring or stricture formation. GEJ:  41 cm Hiatus:  43 cm Stomach:  Stomach was empty and distended very well with insufflation. Folds in the proximal stomach are normal. Examination of osa atbody was normal. Patchy/linear erythema noted at antrum without erosions or ulcers. Pyloric channel was patent. Angularis fundus and cardia were examined by retroflexing the scope and these areas were normal. Duodenum:  Normal bulbar and post bulbar mucosa.  Therapeutic/Diagnostic Maneuvers Performed:  Esophagus was dilated by passing 56 [place] Maloney dilator for the insertion. His  aphasia mucosa was examined post dilation and no mucosal destruction noted.  Complications:  Transient drop in O2 sats noted following sedation responding to O2 via mask.  Impression: Small sliding hiatal hernia without ring or stricture formation. Esophagus dilated by passing 56 French balloon dilator but no mucosal disruption induced. Nonerosive antral gastritis.  Recommendations:  Take pantoprazole 40 mg by mouth before evening meal daily. Progress report in one week.  REHMAN,NAJEEB U  05/06/2013  3:08 PM  CC: Dr. Carylon Perches, MD & Dr. Bonnetta Barry ref. provider found

## 2013-05-06 NOTE — H&P (Signed)
[name] is an 79 y.o. female.   Chief Complaint: [name]. HPI: Patient is a 19-year-old Caucasian female with multiple medical problems including chronic GERD who presents with dysphagia to solids and pills. Her esophagus was last dilated in August 2013. She is on pantoprazole. She complains of frequent heartburn and regurgitation at night. She denies nausea vomiting or hematemesis. She also complains of bloating. Her bowels are irregular. She has diarrhea and/or constipation. She denies melena or rectal bleeding. She has occasional abdominal pain. She has history of small bowel Crohn disease and felt to be in remission.  Past Medical History  Diagnosis Date  . Essential hypertension, benign   . Crohn's disease   . Spinal stenosis   . PSVT (paroxysmal supraventricular tachycardia)   . History of TIAs   . GERD (gastroesophageal reflux disease)   . Carotid artery disease     RICA 50-69% 12/09  . PONV (postoperative nausea and vomiting)     years ago had N/V, but not recently  . Myocardial infarction   . COPD (chronic obstructive pulmonary disease)   . Thyroid mass   . Incontinence of urine   . Anxiety   . Arthritis   . Atrial fibrillation     Postoperative - suboptimal Coumadin candidate  . CAD (coronary artery disease), native coronary artery   . Port-a-cath in place 01/22/2012  . 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 Carcinoma of tonsillar pillars (anterior) (posterior)     XRT and resection    Past Surgical History  Procedure Laterality Date  . Left thoracotomy with wedge resection  2008    Left lower lobe  . Right tonsillectomy  2005  . Left thyroidectomy  2006  . Left breast biopsy  2011  . US echocardiography    . Cholecystectomy    . Cardiovascular stress test    . Abdominal hysterectomy    . Thyroidectomy  08/30/2011    Procedure: THYROIDECTOMY;  Surgeon: Leonette Most;  Location: [facility] OR;   Service: ENT;  Laterality: Right;    Family History  Problem Relation Age of Onset  . Coronary artery disease Father     Died age 37  . Dementia Mother    Social History:  reports that she quit smoking about 19 years ago. Her smoking use included Cigarettes. She smoked 0.00 packs per day. She has never used smokeless tobacco. She reports that she does not drink alcohol or use illicit drugs.  Allergies:  Allergies  Allergen Reactions  . Codeine Nausea And Vomiting  . Demerol Other (See Comments)    Blood pressure  . Morphine And Related Nausea And Vomiting  . Penicillins Hives and Swelling    Medications Prior to Admission  Medication Sig Dispense Refill  . albuterol (PROVENTIL) (2.5 MG/3ML) 0.083% nebulizer solution Take 2.5 mg by nebulization 4 (four) times daily.      Marland Kitchen ALPRAZolam (XANAX) 0.25 MG tablet Take 0.25 mg by mouth at bedtime as needed. For sleep.      Marland Kitchen atenolol (TENORMIN) 25 MG tablet Take 25 mg by mouth every morning.      Marland Kitchen azithromycin (ZITHROMAX) 250 MG tablet Take 250 mg by mouth daily. Completed course      . calcium carbonate (TUMS - DOSED IN MG ELEMENTAL CALCIUM) 500 MG chewable tablet Chew 1 tablet by mouth 2 (two) times daily as needed for heartburn.       Marland Kitchen  fluticasone (FLONASE) 50 MCG/ACT nasal spray Place 2 sprays into the nose daily.        . furosemide (LASIX) 40 MG tablet Take 40 mg by mouth daily. 20mg  in am and 20mg  in pm      . ipratropium (ATROVENT) 0.02 % nebulizer solution Take 250 mcg by nebulization 4 (four) times daily.      Marland Kitchen levothyroxine (SYNTHROID, LEVOTHROID) 75 MCG tablet Take 75 mcg by mouth every morning.      . lidocaine (LIDODERM) 5 % Place 1 patch onto the skin daily as needed (for pain). Remove & Discard patch within 12 hours or as directed by MD. Pain.      . mesalamine (PENTASA) 250 MG CR capsule Take 1,000 mg by mouth 4 (four) times daily.      . OMEGA 3 1000 MG CAPS Take 1,000 mg by mouth daily.       . pantoprazole (PROTONIX)  40 MG tablet Take 40 mg by mouth every morning.      . potassium chloride (K-DUR) 10 MEQ tablet Take 1 tablet (10 mEq total) by mouth daily.  90 tablet  1  . PROAIR HFA 108 (90 BASE) MCG/ACT inhaler Inhale 2 puffs into the lungs every 6 (six) hours as needed for wheezing or shortness of breath.       . [DISCONTINUED] furosemide (LASIX) 40 MG tablet Take 1 tablet (40 mg total) by mouth daily.  90 tablet  1    No results found for this or any previous visit (from the past 48 hour(s)). No results found.  ROS  Blood pressure 151/70, pulse 64, temperature 98.4 F (36.9 C), temperature source Oral, resp. rate 22, height 5\' 4"  (1.626 m), weight 198 lb (89.812 kg), SpO2 97.00%. Physical Exam  Constitutional: She appears well-developed and well-nourished.  HENT:  Mouth/Throat: Oropharynx is clear and moist.  Eyes: Conjunctivae are normal. No scleral icterus.  Neck: No thyromegaly present.  Cardiovascular: Normal rate, regular rhythm and normal heart sounds.   No murmur heard. Respiratory: Effort normal and breath sounds normal.  GI: Soft. She exhibits no distension and no mass. There is no tenderness.  Musculoskeletal: Edema: 1-2+ pitting the lateral ankles.  Lymphadenopathy:    She has no cervical adenopathy.  Neurological: She is alert.  Skin: Skin is warm and dry.     Assessment/Plan Dysphagia to pills and solids. Chronic GERD. EGD an ED.  Kailin Principato U 05/06/2013, 2:41 PM

## 2013-05-08 ENCOUNTER — Encounter (HOSPITAL_COMMUNITY): Payer: Self-pay | Admitting: Internal Medicine

## 2013-05-14 ENCOUNTER — Telehealth (INDEPENDENT_AMBULATORY_CARE_PROVIDER_SITE_OTHER): Payer: Self-pay | Admitting: *Deleted

## 2013-05-14 NOTE — Telephone Encounter (Signed)
Call Report - She seems to be doing better - Had to go see Dr. Ouida Sills and he up the Lasix. Her return phone number is [phone].

## 2013-05-14 NOTE — Telephone Encounter (Signed)
Glad to know that patient able to swallow better

## 2013-05-14 NOTE — Telephone Encounter (Signed)
This was forwarded to Dr.Rehman for review, if there are any further recommendations we will call MS. [name].

## 2013-05-19 ENCOUNTER — Other Ambulatory Visit (HOSPITAL_COMMUNITY): Payer: Self-pay | Admitting: Radiology

## 2013-05-19 DIAGNOSIS — C7800 Secondary malignant neoplasm of unspecified lung: Secondary | ICD-10-CM

## 2013-05-22 ENCOUNTER — Ambulatory Visit (HOSPITAL_COMMUNITY)
Admission: RE | Admit: 2013-05-22 | Discharge: 2013-05-22 | Disposition: A | Discharge status: Home / Self Care | Payer: Medicare Other | Source: Ambulatory Visit | Attending: Internal Medicine | Admitting: Internal Medicine

## 2013-05-22 ENCOUNTER — Other Ambulatory Visit (HOSPITAL_COMMUNITY): Payer: Self-pay | Admitting: Internal Medicine

## 2013-05-22 DIAGNOSIS — R0602 Shortness of breath: Secondary | ICD-10-CM

## 2013-05-22 DIAGNOSIS — R059 Cough, unspecified: Secondary | ICD-10-CM | POA: Insufficient documentation

## 2013-05-22 DIAGNOSIS — J449 Chronic obstructive pulmonary disease, unspecified: Secondary | ICD-10-CM | POA: Insufficient documentation

## 2013-05-22 DIAGNOSIS — C3490 Malignant neoplasm of unspecified part of unspecified bronchus or lung: Secondary | ICD-10-CM | POA: Insufficient documentation

## 2013-05-22 DIAGNOSIS — J4489 Other specified chronic obstructive pulmonary disease: Secondary | ICD-10-CM | POA: Insufficient documentation

## 2013-05-22 DIAGNOSIS — R05 Cough: Secondary | ICD-10-CM | POA: Insufficient documentation

## 2013-05-27 ENCOUNTER — Ambulatory Visit (HOSPITAL_COMMUNITY)
Admission: RE | Admit: 2013-05-27 | Discharge: 2013-05-27 | Disposition: A | Discharge status: Home / Self Care | Payer: Medicare Other | Source: Ambulatory Visit | Attending: Radiology | Admitting: Radiology

## 2013-05-27 DIAGNOSIS — Z85828 Personal history of other malignant neoplasm of skin: Secondary | ICD-10-CM | POA: Insufficient documentation

## 2013-05-27 DIAGNOSIS — C3490 Malignant neoplasm of unspecified part of unspecified bronchus or lung: Secondary | ICD-10-CM | POA: Insufficient documentation

## 2013-05-27 DIAGNOSIS — C7800 Secondary malignant neoplasm of unspecified lung: Secondary | ICD-10-CM

## 2013-05-28 ENCOUNTER — Other Ambulatory Visit: Payer: Self-pay | Admitting: Cardiology

## 2013-05-28 ENCOUNTER — Encounter: Payer: Self-pay | Admitting: Cardiology

## 2013-06-10 ENCOUNTER — Encounter (HOSPITAL_COMMUNITY): Payer: Medicare Other | Attending: Oncology

## 2013-06-10 DIAGNOSIS — Z9889 Other specified postprocedural states: Secondary | ICD-10-CM | POA: Insufficient documentation

## 2013-06-10 DIAGNOSIS — C760 Malignant neoplasm of head, face and neck: Secondary | ICD-10-CM

## 2013-06-10 DIAGNOSIS — Z452 Encounter for adjustment and management of vascular access device: Secondary | ICD-10-CM

## 2013-06-10 DIAGNOSIS — Z95828 Presence of other vascular implants and grafts: Secondary | ICD-10-CM

## 2013-06-10 MED ORDER — HEPARIN SOD (PORK) LOCK FLUSH 100 UNIT/ML IV SOLN
INTRAVENOUS | Status: AC
  Filled 2013-06-10: qty 5

## 2013-06-10 MED ORDER — HEPARIN SOD (PORK) LOCK FLUSH 100 UNIT/ML IV SOLN
500.0000 [IU] | Freq: Once | INTRAVENOUS | Status: AC
  Administered 2013-06-10: 500 [IU] via INTRAVENOUS
  Filled 2013-06-10: qty 5

## 2013-06-10 MED ORDER — SODIUM CHLORIDE 0.9 % IV SOLN: INTRAVENOUS | Status: DC

## 2013-06-10 MED ORDER — SODIUM CHLORIDE 0.9 % IJ SOLN
10.0000 mL | INTRAMUSCULAR | Status: DC | PRN
  Administered 2013-06-10: 10 mL via INTRAVENOUS
  Filled 2013-06-10: qty 10

## 2013-06-10 NOTE — Progress Notes (Signed)
[name] presented for Portacath access and flush. Proper placement of portacath confirmed by CXR. Portacath located right chest wall accessed with  H 20 needle. Good blood return present. Portacath flushed with 20ml NS and 500U/5ml Heparin and needle removed intact. Procedure without incident. Patient tolerated procedure well.   

## 2013-07-22 ENCOUNTER — Encounter (HOSPITAL_COMMUNITY): Payer: Medicare Other

## 2013-08-04 ENCOUNTER — Encounter (HOSPITAL_COMMUNITY): Payer: Medicare Other | Attending: Oncology

## 2013-08-04 DIAGNOSIS — Z452 Encounter for adjustment and management of vascular access device: Secondary | ICD-10-CM

## 2013-08-04 DIAGNOSIS — Z95828 Presence of other vascular implants and grafts: Secondary | ICD-10-CM

## 2013-08-04 DIAGNOSIS — Z9889 Other specified postprocedural states: Secondary | ICD-10-CM | POA: Insufficient documentation

## 2013-08-04 DIAGNOSIS — C760 Malignant neoplasm of head, face and neck: Secondary | ICD-10-CM

## 2013-08-04 MED ORDER — HEPARIN SOD (PORK) LOCK FLUSH 100 UNIT/ML IV SOLN
INTRAVENOUS | Status: AC
  Filled 2013-08-04: qty 5

## 2013-08-04 MED ORDER — SODIUM CHLORIDE 0.9 % IJ SOLN
10.0000 mL | INTRAMUSCULAR | Status: DC | PRN
  Administered 2013-08-04: 10 mL via INTRAVENOUS

## 2013-08-04 MED ORDER — HEPARIN SOD (PORK) LOCK FLUSH 100 UNIT/ML IV SOLN
500.0000 [IU] | Freq: Once | INTRAVENOUS | Status: AC
  Administered 2013-08-04: 500 [IU] via INTRAVENOUS

## 2013-08-04 NOTE — Progress Notes (Signed)
[name] presented for Portacath access and flush. Portacath located right chest wall accessed with  H 20 needle. Good blood return present. Portacath flushed with 20ml NS and 500U/5ml Heparin and needle removed intact. Procedure without incident. Patient tolerated procedure well.   

## 2013-09-15 ENCOUNTER — Encounter (HOSPITAL_COMMUNITY): Payer: Medicare Other | Attending: Oncology

## 2013-09-15 DIAGNOSIS — C760 Malignant neoplasm of head, face and neck: Secondary | ICD-10-CM

## 2013-09-15 DIAGNOSIS — Z95828 Presence of other vascular implants and grafts: Secondary | ICD-10-CM

## 2013-09-15 DIAGNOSIS — Z452 Encounter for adjustment and management of vascular access device: Secondary | ICD-10-CM

## 2013-09-15 DIAGNOSIS — Z9889 Other specified postprocedural states: Secondary | ICD-10-CM | POA: Insufficient documentation

## 2013-09-15 MED ORDER — SODIUM CHLORIDE 0.9 % IJ SOLN
10.0000 mL | INTRAMUSCULAR | Status: DC | PRN
  Administered 2013-09-15: 10 mL via INTRAVENOUS

## 2013-09-15 MED ORDER — HEPARIN SOD (PORK) LOCK FLUSH 100 UNIT/ML IV SOLN
500.0000 [IU] | Freq: Once | INTRAVENOUS | Status: AC
  Administered 2013-09-15: 500 [IU] via INTRAVENOUS

## 2013-09-15 MED ORDER — HEPARIN SOD (PORK) LOCK FLUSH 100 UNIT/ML IV SOLN
INTRAVENOUS | Status: AC
Start: 2013-09-15 — End: 2013-09-15
  Filled 2013-09-15: qty 5

## 2013-09-15 NOTE — Progress Notes (Signed)
Andrena K Thone presented for Portacath access and flush. Portacath located right chest wall accessed with  H 20 needle. Good blood return present. Portacath flushed with 20ml NS and 500U/5ml Heparin and needle removed intact. Procedure without incident. Patient tolerated procedure well.   

## 2013-09-22 ENCOUNTER — Other Ambulatory Visit: Payer: Self-pay | Admitting: Cardiology

## 2013-09-30 ENCOUNTER — Other Ambulatory Visit (HOSPITAL_COMMUNITY): Payer: Self-pay | Admitting: Radiology

## 2013-09-30 DIAGNOSIS — C7800 Secondary malignant neoplasm of unspecified lung: Secondary | ICD-10-CM

## 2013-10-14 ENCOUNTER — Ambulatory Visit (HOSPITAL_COMMUNITY): Payer: Medicare Other

## 2013-10-14 ENCOUNTER — Ambulatory Visit (HOSPITAL_COMMUNITY)
Admission: RE | Admit: 2013-10-14 | Discharge: 2013-10-14 | Disposition: A | Discharge status: Home / Self Care | Payer: Medicare Other | Source: Ambulatory Visit | Attending: Radiology | Admitting: Radiology

## 2013-10-14 DIAGNOSIS — E278 Other specified disorders of adrenal gland: Secondary | ICD-10-CM | POA: Insufficient documentation

## 2013-10-14 DIAGNOSIS — C7800 Secondary malignant neoplasm of unspecified lung: Secondary | ICD-10-CM

## 2013-10-14 DIAGNOSIS — C3490 Malignant neoplasm of unspecified part of unspecified bronchus or lung: Secondary | ICD-10-CM | POA: Insufficient documentation

## 2013-10-14 DIAGNOSIS — C099 Malignant neoplasm of tonsil, unspecified: Secondary | ICD-10-CM | POA: Insufficient documentation

## 2013-10-14 LAB — POCT I-STAT CREATININE: Creatinine, Ser: 1 mg/dL (ref 0.50–1.10)

## 2013-10-14 MED ORDER — IOHEXOL 300 MG/ML  SOLN
100.0000 mL | Freq: Once | INTRAMUSCULAR | Status: AC | PRN
  Administered 2013-10-14: 100 mL via INTRAVENOUS

## 2013-10-27 ENCOUNTER — Encounter (HOSPITAL_COMMUNITY): Payer: Medicare Other

## 2013-11-10 ENCOUNTER — Encounter (HOSPITAL_COMMUNITY): Payer: Medicare Other | Attending: Oncology

## 2013-11-10 DIAGNOSIS — Z9889 Other specified postprocedural states: Secondary | ICD-10-CM | POA: Insufficient documentation

## 2013-11-10 DIAGNOSIS — C760 Malignant neoplasm of head, face and neck: Secondary | ICD-10-CM

## 2013-11-10 DIAGNOSIS — Z452 Encounter for adjustment and management of vascular access device: Secondary | ICD-10-CM

## 2013-11-10 DIAGNOSIS — Z95828 Presence of other vascular implants and grafts: Secondary | ICD-10-CM

## 2013-11-10 MED ORDER — SODIUM CHLORIDE 0.9 % IJ SOLN
10.0000 mL | INTRAMUSCULAR | Status: DC | PRN
  Administered 2013-11-10: 10 mL via INTRAVENOUS

## 2013-11-10 MED ORDER — HEPARIN SOD (PORK) LOCK FLUSH 100 UNIT/ML IV SOLN
500.0000 [IU] | Freq: Once | INTRAVENOUS | Status: AC
  Administered 2013-11-10: 500 [IU] via INTRAVENOUS

## 2013-11-10 MED ORDER — HEPARIN SOD (PORK) LOCK FLUSH 100 UNIT/ML IV SOLN
INTRAVENOUS | Status: AC
  Filled 2013-11-10: qty 5

## 2013-11-10 NOTE — Progress Notes (Signed)
Patient reported weakness, irregular heartbeat and falling for several days. Reported to Dr. Ria Comment office who will be in contact, pt also to told to call the office for follow up. Encouraged to go to the ED if she felt her condition was worsening. Pt verbalized understanding of all.

## 2013-11-10 NOTE — Progress Notes (Signed)
[name] presented for Portacath access and flush. Portacath located rt chest wall accessed with  H 20 needle. Good blood return present. Portacath flushed with 53ml NS and 500U/48ml Heparin and needle removed intact. Procedure without incident. Patient tolerated procedure well.

## 2013-11-13 ENCOUNTER — Encounter (HOSPITAL_COMMUNITY): Payer: Self-pay | Admitting: Emergency Medicine

## 2013-11-13 ENCOUNTER — Other Ambulatory Visit: Payer: Self-pay

## 2013-11-13 ENCOUNTER — Emergency Department (HOSPITAL_COMMUNITY): Payer: Medicare Other

## 2013-11-13 ENCOUNTER — Inpatient Hospital Stay (HOSPITAL_COMMUNITY)
Admission: EM | Admit: 2013-11-13 | Discharge: 2013-11-18 | DRG: 191 | Disposition: A | Discharge status: Home Health | Payer: Medicare Other | Attending: Internal Medicine | Admitting: Internal Medicine

## 2013-11-13 DIAGNOSIS — I10 Essential (primary) hypertension: Secondary | ICD-10-CM | POA: Diagnosis present

## 2013-11-13 DIAGNOSIS — I779 Disorder of arteries and arterioles, unspecified: Secondary | ICD-10-CM | POA: Diagnosis present

## 2013-11-13 DIAGNOSIS — Z87891 Personal history of nicotine dependence: Secondary | ICD-10-CM

## 2013-11-13 DIAGNOSIS — I2510 Atherosclerotic heart disease of native coronary artery without angina pectoris: Secondary | ICD-10-CM | POA: Diagnosis present

## 2013-11-13 DIAGNOSIS — R112 Nausea with vomiting, unspecified: Secondary | ICD-10-CM | POA: Diagnosis present

## 2013-11-13 DIAGNOSIS — I739 Peripheral vascular disease, unspecified: Secondary | ICD-10-CM

## 2013-11-13 DIAGNOSIS — M129 Arthropathy, unspecified: Secondary | ICD-10-CM | POA: Diagnosis present

## 2013-11-13 DIAGNOSIS — J441 Chronic obstructive pulmonary disease with (acute) exacerbation: Principal | ICD-10-CM | POA: Diagnosis present

## 2013-11-13 DIAGNOSIS — K219 Gastro-esophageal reflux disease without esophagitis: Secondary | ICD-10-CM | POA: Diagnosis present

## 2013-11-13 DIAGNOSIS — B370 Candidal stomatitis: Secondary | ICD-10-CM | POA: Diagnosis not present

## 2013-11-13 DIAGNOSIS — J069 Acute upper respiratory infection, unspecified: Secondary | ICD-10-CM | POA: Diagnosis present

## 2013-11-13 DIAGNOSIS — E039 Hypothyroidism, unspecified: Secondary | ICD-10-CM | POA: Diagnosis present

## 2013-11-13 DIAGNOSIS — Z9221 Personal history of antineoplastic chemotherapy: Secondary | ICD-10-CM

## 2013-11-13 DIAGNOSIS — I4891 Unspecified atrial fibrillation: Secondary | ICD-10-CM | POA: Diagnosis present

## 2013-11-13 DIAGNOSIS — Z85118 Personal history of other malignant neoplasm of bronchus and lung: Secondary | ICD-10-CM

## 2013-11-13 DIAGNOSIS — Z8673 Personal history of transient ischemic attack (TIA), and cerebral infarction without residual deficits: Secondary | ICD-10-CM

## 2013-11-13 DIAGNOSIS — R197 Diarrhea, unspecified: Secondary | ICD-10-CM | POA: Diagnosis present

## 2013-11-13 DIAGNOSIS — R0902 Hypoxemia: Secondary | ICD-10-CM | POA: Diagnosis present

## 2013-11-13 DIAGNOSIS — Z85819 Personal history of malignant neoplasm of unspecified site of lip, oral cavity, and pharynx: Secondary | ICD-10-CM

## 2013-11-13 DIAGNOSIS — E0789 Other specified disorders of thyroid: Secondary | ICD-10-CM | POA: Diagnosis present

## 2013-11-13 DIAGNOSIS — Z8249 Family history of ischemic heart disease and other diseases of the circulatory system: Secondary | ICD-10-CM

## 2013-11-13 DIAGNOSIS — I5030 Unspecified diastolic (congestive) heart failure: Secondary | ICD-10-CM | POA: Diagnosis present

## 2013-11-13 DIAGNOSIS — I509 Heart failure, unspecified: Secondary | ICD-10-CM | POA: Diagnosis present

## 2013-11-13 DIAGNOSIS — E782 Mixed hyperlipidemia: Secondary | ICD-10-CM

## 2013-11-13 DIAGNOSIS — F411 Generalized anxiety disorder: Secondary | ICD-10-CM | POA: Diagnosis present

## 2013-11-13 DIAGNOSIS — I252 Old myocardial infarction: Secondary | ICD-10-CM

## 2013-11-13 LAB — CBC WITH DIFFERENTIAL/PLATELET
Basophils Absolute: 0 10*3/uL (ref 0.0–0.1)
Basophils Relative: 0 % (ref 0–1)
Eosinophils Absolute: 0 10*3/uL (ref 0.0–0.7)
Eosinophils Relative: 1 % (ref 0–5)
HCT: 43.7 % (ref 36.0–46.0)
Hemoglobin: 14.4 g/dL (ref 12.0–15.0)
LYMPHS ABS: 0.6 10*3/uL — AB (ref 0.7–4.0)
LYMPHS PCT: 13 % (ref 12–46)
MCH: 31.1 pg (ref 26.0–34.0)
MCHC: 33 g/dL (ref 30.0–36.0)
MCV: 94.4 fL (ref 78.0–100.0)
Monocytes Absolute: 0.5 10*3/uL (ref 0.1–1.0)
Monocytes Relative: 13 % — ABNORMAL HIGH (ref 3–12)
Neutro Abs: 3.1 10*3/uL (ref 1.7–7.7)
Neutrophils Relative %: 73 % (ref 43–77)
Platelets: 206 10*3/uL (ref 150–400)
RBC: 4.63 MIL/uL (ref 3.87–5.11)
RDW: 15 % (ref 11.5–15.5)
WBC: 4.2 10*3/uL (ref 4.0–10.5)

## 2013-11-13 LAB — COMPREHENSIVE METABOLIC PANEL
ALT: 8 U/L (ref 0–35)
AST: 15 U/L (ref 0–37)
Albumin: 3.2 g/dL — ABNORMAL LOW (ref 3.5–5.2)
Alkaline Phosphatase: 86 U/L (ref 39–117)
BILIRUBIN TOTAL: 0.4 mg/dL (ref 0.3–1.2)
BUN: 11 mg/dL (ref 6–23)
CALCIUM: 8.3 mg/dL — AB (ref 8.4–10.5)
CO2: 34 meq/L — AB (ref 19–32)
Chloride: 93 mEq/L — ABNORMAL LOW (ref 96–112)
Creatinine, Ser: 0.67 mg/dL (ref 0.50–1.10)
GFR calc Af Amer: >90 mL/min (ref >90)
GFR, EST NON AFRICAN AMERICAN: 79 mL/min — AB (ref >90)
GLUCOSE: 106 mg/dL — AB (ref 70–99)
Potassium: 3.8 mEq/L (ref 3.7–5.3)
SODIUM: 137 meq/L (ref 137–147)
Total Protein: 6.7 g/dL (ref 6.0–8.3)

## 2013-11-13 LAB — PRO B NATRIURETIC PEPTIDE: PRO B NATRI PEPTIDE: 1272 pg/mL — AB (ref 0–450)

## 2013-11-13 LAB — TSH: TSH: 3.281 u[IU]/mL (ref 0.350–4.500)

## 2013-11-13 LAB — TROPONIN I

## 2013-11-13 LAB — MAGNESIUM: Magnesium: 2 mg/dL (ref 1.5–2.5)

## 2013-11-13 MED ORDER — CALCIUM CARBONATE ANTACID 500 MG PO CHEW: 1.0000 | CHEWABLE_TABLET | Freq: Two times a day (BID) | ORAL | Status: DC | PRN

## 2013-11-13 MED ORDER — IPRATROPIUM-ALBUTEROL 0.5-2.5 (3) MG/3ML IN SOLN
3.0000 mL | Freq: Once | RESPIRATORY_TRACT | Status: AC
  Administered 2013-11-13: 3 mL via RESPIRATORY_TRACT
  Filled 2013-11-13: qty 3

## 2013-11-13 MED ORDER — DILTIAZEM HCL 25 MG/5ML IV SOLN
10.0000 mg | Freq: Once | INTRAVENOUS | Status: AC
  Administered 2013-11-13: 10 mg via INTRAVENOUS
  Filled 2013-11-13: qty 5

## 2013-11-13 MED ORDER — ACETAMINOPHEN 325 MG PO TABS
650.0000 mg | ORAL_TABLET | Freq: Four times a day (QID) | ORAL | Status: DC | PRN
  Filled 2013-11-13: qty 2

## 2013-11-13 MED ORDER — IPRATROPIUM-ALBUTEROL 0.5-2.5 (3) MG/3ML IN SOLN
3.0000 mL | Freq: Four times a day (QID) | RESPIRATORY_TRACT | Status: DC
  Administered 2013-11-14 (×4): 3 mL via RESPIRATORY_TRACT
  Filled 2013-11-13 (×4): qty 3

## 2013-11-13 MED ORDER — METHYLPREDNISOLONE SODIUM SUCC 125 MG IJ SOLR
125.0000 mg | Freq: Once | INTRAMUSCULAR | Status: AC
  Administered 2013-11-13: 125 mg via INTRAVENOUS
  Filled 2013-11-13: qty 2

## 2013-11-13 MED ORDER — ACETAMINOPHEN 650 MG RE SUPP: 650.0000 mg | Freq: Four times a day (QID) | RECTAL | Status: DC | PRN

## 2013-11-13 MED ORDER — SODIUM CHLORIDE 0.9 % IJ SOLN
3.0000 mL | Freq: Two times a day (BID) | INTRAMUSCULAR | Status: DC
  Administered 2013-11-13 – 2013-11-17 (×6): 3 mL via INTRAVENOUS

## 2013-11-13 MED ORDER — SODIUM CHLORIDE 0.9 % IJ SOLN
3.0000 mL | Freq: Two times a day (BID) | INTRAMUSCULAR | Status: DC
  Administered 2013-11-14 – 2013-11-17 (×6): 3 mL via INTRAVENOUS

## 2013-11-13 MED ORDER — ATENOLOL 25 MG PO TABS
25.0000 mg | ORAL_TABLET | Freq: Two times a day (BID) | ORAL | Status: DC
  Administered 2013-11-13 – 2013-11-18 (×10): 25 mg via ORAL
  Filled 2013-11-13 (×10): qty 1

## 2013-11-13 MED ORDER — IPRATROPIUM-ALBUTEROL 0.5-2.5 (3) MG/3ML IN SOLN
3.0000 mL | RESPIRATORY_TRACT | Status: DC
  Administered 2013-11-13 (×3): 3 mL via RESPIRATORY_TRACT
  Filled 2013-11-13 (×3): qty 3

## 2013-11-13 MED ORDER — METHYLPREDNISOLONE SODIUM SUCC 125 MG IJ SOLR
60.0000 mg | Freq: Every day | INTRAMUSCULAR | Status: DC
  Administered 2013-11-13: 60 mg via INTRAVENOUS
  Filled 2013-11-13: qty 2

## 2013-11-13 MED ORDER — LEVOTHYROXINE SODIUM 75 MCG PO TABS
75.0000 ug | ORAL_TABLET | Freq: Every day | ORAL | Status: DC
  Administered 2013-11-13 – 2013-11-18 (×6): 75 ug via ORAL
  Filled 2013-11-13 (×6): qty 1

## 2013-11-13 MED ORDER — FUROSEMIDE 10 MG/ML IJ SOLN
40.0000 mg | Freq: Once | INTRAMUSCULAR | Status: AC
Start: 2013-11-13 — End: 2013-11-13
  Administered 2013-11-13: 40 mg via INTRAVENOUS
  Filled 2013-11-13: qty 4

## 2013-11-13 MED ORDER — POTASSIUM CHLORIDE CRYS ER 10 MEQ PO TBCR
10.0000 meq | EXTENDED_RELEASE_TABLET | Freq: Every day | ORAL | Status: DC
  Administered 2013-11-13 – 2013-11-14 (×2): 10 meq via ORAL
  Filled 2013-11-13 (×2): qty 1

## 2013-11-13 MED ORDER — GUAIFENESIN 100 MG/5ML PO SOLN
200.0000 mg | ORAL | Status: DC | PRN
  Administered 2013-11-14 – 2013-11-16 (×2): 200 mg via ORAL
  Filled 2013-11-13: qty 10
  Filled 2013-11-13: qty 5

## 2013-11-13 MED ORDER — OMEGA-3-ACID ETHYL ESTERS 1 G PO CAPS
1000.0000 mg | ORAL_CAPSULE | Freq: Every day | ORAL | Status: DC
  Administered 2013-11-13 – 2013-11-18 (×6): 1000 mg via ORAL
  Filled 2013-11-13 (×6): qty 1

## 2013-11-13 MED ORDER — SODIUM CHLORIDE 0.9 % IJ SOLN: 3.0000 mL | INTRAMUSCULAR | Status: DC | PRN

## 2013-11-13 MED ORDER — FUROSEMIDE 40 MG PO TABS
40.0000 mg | ORAL_TABLET | Freq: Every day | ORAL | Status: DC
  Administered 2013-11-13 – 2013-11-18 (×6): 40 mg via ORAL
  Filled 2013-11-13 (×6): qty 1

## 2013-11-13 MED ORDER — LIDOCAINE 5 % EX PTCH
1.0000 | MEDICATED_PATCH | Freq: Every day | CUTANEOUS | Status: DC | PRN
  Administered 2013-11-14 – 2013-11-17 (×3): 1 via TRANSDERMAL
  Filled 2013-11-13: qty 1

## 2013-11-13 MED ORDER — ENOXAPARIN SODIUM 40 MG/0.4ML ~~LOC~~ SOLN
40.0000 mg | SUBCUTANEOUS | Status: DC
Start: 2013-11-13 — End: 2013-11-18
  Administered 2013-11-13 – 2013-11-18 (×6): 40 mg via SUBCUTANEOUS
  Filled 2013-11-13 (×6): qty 0.4

## 2013-11-13 MED ORDER — FLUTICASONE PROPIONATE 50 MCG/ACT NA SUSP
2.0000 | Freq: Every day | NASAL | Status: DC
  Administered 2013-11-13 – 2013-11-18 (×5): 2 via NASAL
  Filled 2013-11-13: qty 16

## 2013-11-13 MED ORDER — MESALAMINE ER 250 MG PO CPCR
1000.0000 mg | ORAL_CAPSULE | Freq: Four times a day (QID) | ORAL | Status: DC
  Administered 2013-11-13 – 2013-11-18 (×21): 1000 mg via ORAL
  Filled 2013-11-13 (×30): qty 4

## 2013-11-13 MED ORDER — IPRATROPIUM-ALBUTEROL 0.5-2.5 (3) MG/3ML IN SOLN
3.0000 mL | RESPIRATORY_TRACT | Status: DC | PRN
Start: 2013-11-13 — End: 2013-11-18

## 2013-11-13 MED ORDER — ALPRAZOLAM 0.25 MG PO TABS
0.2500 mg | ORAL_TABLET | Freq: Every evening | ORAL | Status: DC | PRN
  Administered 2013-11-13 – 2013-11-14 (×2): 0.25 mg via ORAL
  Filled 2013-11-13 (×2): qty 1

## 2013-11-13 MED ORDER — SODIUM CHLORIDE 0.9 % IV SOLN: 250.0000 mL | INTRAVENOUS | Status: DC | PRN

## 2013-11-13 NOTE — ED Provider Notes (Signed)
CSN: [id number]     Arrival date & time 11/13/13  2778 History   First MD Initiated Contact with Patient 11/13/13 0316     Chief Complaint  Patient presents with  . Shortness of Breath     (Consider location/radiation/quality/duration/timing/severity/associated sxs/prior Treatment) Patient is a 80 y.o. female presenting with shortness of breath. The history is provided by the patient.  Shortness of Breath She has been having gradually worsening shortness of breath for an extended period of time. She has been using nebulizers at home I'll which has been giving her relief but tonight they were not giving her relief. She has had a cough productive of yellowish sputum. She is not aware any fever but she has had some chills. She denies sweats. She's had nausea and vomiting diarrhea. Several days ago, she had episodes of chest pain but has not had any tonight. Her dyspnea is worse with laying flat and worse with exertion. Nothing makes it better. She has been on a diuretic and has not been aware of any leg swelling.  Past Medical History  Diagnosis Date  . Essential hypertension, benign   . Crohn's disease   . Spinal stenosis   . PSVT (paroxysmal supraventricular tachycardia)   . History of TIAs   . GERD (gastroesophageal reflux disease)   . Carotid artery disease     RICA 50-69% 12/09  . PONV (postoperative nausea and vomiting)     years ago had N/V, but not recently  . Myocardial infarction   . COPD (chronic obstructive pulmonary disease)   . Thyroid mass   . Incontinence of urine   . Anxiety   . Arthritis   . Atrial fibrillation     Postoperative - suboptimal Coumadin candidate  . CAD (coronary artery disease), native coronary artery   . Port-a-cath in place 01/22/2012  . 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 Left thoracotomy with wedge resection  2008    Left lower lobe  . Right tonsillectomy  2005  . Left thyroidectomy  2006  . Left breast biopsy  2011  . US echocardiography    . Cholecystectomy    . Cardiovascular stress test    . Abdominal hysterectomy    . Thyroidectomy  08/30/2011    Procedure: THYROIDECTOMY;  Surgeon: Cecil Cranker;  Location: [facility] OR;  Service: ENT;  Laterality: Right;  . Esophagogastroduodenoscopy (egd) with esophageal dilation N/A 05/06/2013    Procedure: ESOPHAGOGASTRODUODENOSCOPY (EGD) WITH ESOPHAGEAL DILATION;  Surgeon: Rogene Houston, MD;  Location: [facility];  Service: Endoscopy;  Laterality: N/A;  40   Family History  Problem Relation Age of Onset  . Coronary artery disease Father     Died age 59  . Dementia Mother    History  Substance Use Topics  . Smoking status: Former Smoker    Types: Cigarettes    Quit date: 10/20/1993  . Smokeless tobacco: Never Used  . Alcohol Use: No   OB History   Grav Para Term Preterm Abortions TAB SAB Ect Mult Living                 Review of Systems  Respiratory: Positive for shortness of breath.   All other systems reviewed and are negative.      Allergies  Codeine; Demerol;  Morphine and related; and Penicillins  Home Medications   Current Outpatient Rx  Name  Route  Sig  Dispense  Refill  . albuterol (PROVENTIL) (2.5 MG/3ML) 0.083% nebulizer solution   Nebulization   Take 2.5 mg by nebulization 4 (four) times daily.         Marland Kitchen ALPRAZolam (XANAX) 0.25 MG tablet   Oral   Take 0.25 mg by mouth at bedtime as needed. For sleep.         Marland Kitchen atenolol (TENORMIN) 25 MG tablet      TAKE (1) TABLET BY MOUTH ONCE DAILY.   30 tablet   0     Overdue for due in July must make apt for any refi ...   . azithromycin (ZITHROMAX) 250 MG tablet   Oral   Take 250 mg by mouth daily. Completed course         . calcium carbonate (TUMS - DOSED IN MG ELEMENTAL CALCIUM) 500 MG chewable tablet   Oral    Chew 1 tablet by mouth 2 (two) times daily as needed for heartburn.          . fluticasone (FLONASE) 50 MCG/ACT nasal spray   Nasal   Place 2 sprays into the nose daily.           . furosemide (LASIX) 40 MG tablet   Oral   Take 40 mg by mouth daily. 20mg  in am and 20mg  in pm         . ipratropium (ATROVENT) 0.02 % nebulizer solution   Nebulization   Take 250 mcg by nebulization 4 (four) times daily.         Marland Kitchen levothyroxine (SYNTHROID, LEVOTHROID) 75 MCG tablet   Oral   Take 75 mcg by mouth every morning.         . lidocaine (LIDODERM) 5 %   Transdermal   Place 1 patch onto the skin daily as needed (for pain). Remove & Discard patch within 12 hours or as directed by MD. Pain.         . mesalamine (PENTASA) 250 MG CR capsule   Oral   Take 1,000 mg by mouth 4 (four) times daily.         . OMEGA 3 1000 MG CAPS   Oral   Take 1,000 mg by mouth daily.          Marland Kitchen EXPIRED: pantoprazole (PROTONIX) 40 MG tablet   Oral   Take 40 mg by mouth every morning.         . potassium chloride (K-DUR) 10 MEQ tablet   Oral   Take 1 tablet (10 mEq total) by mouth daily.   90 tablet   1   . PROAIR HFA 108 (90 BASE) MCG/ACT inhaler   Inhalation   Inhale 2 puffs into the lungs every 6 (six) hours as needed for wheezing or shortness of breath.           BP 164/76  Pulse 89  Temp(Src) 97.8 F (36.6 C) (Oral)  Resp 14  Wt 198 lb (89.812 kg)  SpO2 95% Physical Exam  Nursing note and vitals reviewed.  80 year old female, resting comfortably and in no acute distress. Vital signs are significant for hypertension with blood pressure 164/76. Oxygen saturation is 95%, which is normal. Head is normocephalic and atraumatic. PERRLA, EOMI. Oropharynx is clear. Neck is nontender and supple without adenopathy or JVD. Back is nontender and there is no CVA tenderness. Lungs have few  rales at the bases and also scattered wheezes. No rhonchi are heard. Chest is nontender. Heart has  regular rate and rhythm without murmur. Abdomen is soft, flat, nontender without masses or hepatosplenomegaly and peristalsis is normoactive. Extremities have  2+ pretibial edema with trace presacral edema, full range of motion is present. Skin is warm and dry without rash. Neurologic: Mental status is normal, cranial nerves are intact, there are no motor or sensory deficits.  ED Course  Procedures (including critical care time) Labs Review Results for orders placed during the hospital encounter of 11/13/13  CBC WITH DIFFERENTIAL      Result Value Ref Range   WBC 4.2  4.0 - 10.5 K/uL   RBC 4.63  3.87 - 5.11 MIL/uL   Hemoglobin 14.4  12.0 - 15.0 g/dL   HCT 43.7  36.0 - 46.0 %   MCV 94.4  78.0 - 100.0 fL   MCH 31.1  26.0 - 34.0 pg   MCHC 33.0  30.0 - 36.0 g/dL   RDW 15.0  11.5 - 15.5 %   Platelets 206  150 - 400 K/uL   Neutrophils Relative % 73  43 - 77 %   Neutro Abs 3.1  1.7 - 7.7 K/uL   Lymphocytes Relative 13  12 - 46 %   Lymphs Abs 0.6 (*) 0.7 - 4.0 K/uL   Monocytes Relative 13 (*) 3 - 12 %   Monocytes Absolute 0.5  0.1 - 1.0 K/uL   Eosinophils Relative 1  0 - 5 %   Eosinophils Absolute 0.0  0.0 - 0.7 K/uL   Basophils Relative 0  0 - 1 %   Basophils Absolute 0.0  0.0 - 0.1 K/uL  COMPREHENSIVE METABOLIC PANEL      Result Value Ref Range   Sodium 137  137 - 147 mEq/L   Potassium 3.8  3.7 - 5.3 mEq/L   Chloride 93 (*) 96 - 112 mEq/L   CO2 34 (*) 19 - 32 mEq/L   Glucose, Bld 106 (*) 70 - 99 mg/dL   BUN 11  6 - 23 mg/dL   Creatinine, Ser 0.67  0.50 - 1.10 mg/dL   Calcium 8.3 (*) 8.4 - 10.5 mg/dL   Total Protein 6.7  6.0 - 8.3 g/dL   Albumin 3.2 (*) 3.5 - 5.2 g/dL   AST 15  0 - 37 U/L   ALT 8  0 - 35 U/L   Alkaline Phosphatase 86  39 - 117 U/L   Total Bilirubin 0.4  0.3 - 1.2 mg/dL   GFR calc non Af Amer 79 (*) >90 mL/min   GFR calc Af Amer >90  >90 mL/min  PRO B NATRIURETIC PEPTIDE      Result Value Ref Range   Pro B Natriuretic peptide (BNP) 1272.0 (*) 0 - 450 pg/mL   TROPONIN I      Result Value Ref Range   Troponin I <0.30  <0.30 ng/mL   Imaging Review Dg Chest 2 View  11/13/2013   CLINICAL DATA:  Shortness of breath  EXAM: CHEST  2 VIEW  COMPARISON:  Prior CT from 10/14/2013  FINDINGS: Moderate cardiomegaly and is stable as compared to prior study. Prominent atherosclerotic calcifications noted within the aortic arch. Right-sided Port-A-Cath in place.  Lungs are normally inflated. Chronic changes again seen along the left pleural consistent with prior left thoracotomy. No pneumothorax, pulmonary edema, or definite pleural effusion. Chronic bibasilar interstitial markings are similar to prior studies.  No acute osseous abnormality.  Chronic compression deformity is again noted within the thoracic spine.  IMPRESSION: Stable radiographic appearance of the chest with no acute cardiopulmonary process identified.   Electronically Signed   By: Jeannine Boga M.D.   On: 11/13/2013 04:32     Date: 11/13/2013  Rate: 74  Rhythm: normal sinus rhythm and premature atrial contractions (PAC)  QRS Axis: normal  Intervals: normal  ST/T Wave abnormalities: normal  Conduction Disutrbances:none  Narrative Interpretation: Sinus rhythm with occasional PAC. Compared with ECG of 04/13/2013, and nonspecific T-wave changes are no longer present.  Old EKG Reviewed: changes noted   MDM   Final diagnoses:  COPD exacerbation  CHF (congestive heart failure)  Hypoxia    Dyspnea which is probably multifactorial. She has history of lung cancer but this has not seemed to be an active process. She has some degree of bronchospasm and will be given albuterol with ipratropium. She also has evidence of congestive heart failure. Chest x-ray and BNP will be checked.  There was some improvement after initial albuterol with ipratropium. On reexam, there is improved airflow with increased wheezing. A second nebulizer treatment will be given.  There was significant improvement  following second breathing treatment and she is resting currently. There is still some slight wheezing and she is given a third treatment.  Following the third breathing treatment, she was sleeping but there is still considerable wheezing on exam. When her oxygen was turned off, she desaturated to SaO2 of 86%. Decision was made to admit the patient for persistent wheezing and hypoxia. Case is discussed with Dr. Annetta Maw of [facility] who agrees to admit the patient.  Delora Fuel, MD [id number]

## 2013-11-13 NOTE — Progress Notes (Signed)
Pt converted back to NSR. Will continue to monitor.

## 2013-11-13 NOTE — ED Notes (Signed)
Pt c/o sob for several days, states she just has been unable to sleep well at night.  Pt denies cp or other complaints.

## 2013-11-13 NOTE — Progress Notes (Signed)
Pt came up to floor from ED. Pt is SOB and tachycardic at this time. Will place pt on oxygen via nasal cannula and notify MD. Will continue to monitor.

## 2013-11-13 NOTE — Progress Notes (Signed)
Pt called out to desk stating "my blood pressure is dropping fast". RN came to room and VS are stable at BP 106/56, HR 101, SP02 93% with O2 @ 3.5l/min. Pt received 10mg  of IV cardizem push early in the morning in attempt to convert her rhythm of a-fib with RVR back to NSR. Pt's HR has improved and will keep an eye on the BP. Will continue to monitor.

## 2013-11-13 NOTE — H&P (Addendum)
[facility] History and Physical  [name] KGY:[id number] DOB: 10-01-1927 DOA: 11/13/2013  Referring physician:  PCP: [name], MD  Specialists:   Chief Complaint: Shortness of breath  HPI: [name] is a 80 y.o. female  With a history of hypertension, lung cancer, atrial fibrillation, COPD, that presents to the emergency department for shortness of breath. Patient states she has been short of breath however the last few weeks 2 days it has been worsening. Patient states she's unable to sleep as she has to stay in a seated position. Patient states she has been using her nebulizer treatments at home which have given her relief for the most part however overnight she had no relief. Patient's shortness of breath is worse with laying flat in any type of exertion. She is also will note that she's had some nausea, vomiting, diarrhea over the last few days however denies any at this time. Patient states she had a brief episode of coughing with yellow sputum production.  Review of Systems:  Constitutional: Denies fever, chills, diaphoresis, appetite change and fatigue.  HEENT: Denies photophobia, eye pain, redness, hearing loss, ear pain, congestion, sore throat, rhinorrhea, sneezing, mouth sores, trouble swallowing, neck pain, neck stiffness and tinnitus.   Respiratory: Patient complains of shortness of breath and wheezing. Cardiovascular: Denies chest pain, palpitations and leg swelling.  Gastrointestinal: Denies nausea, vomiting, abdominal pain, diarrhea, constipation, blood in stool and abdominal distention.  Genitourinary: Denies dysuria, urgency, frequency, hematuria, flank pain and difficulty urinating.  Musculoskeletal: Denies myalgias, back pain, joint swelling, arthralgias and gait problem.  Skin: Denies pallor, rash and wound.  Neurological: Denies dizziness, seizures, syncope, weakness, light-headedness, numbness and headaches.  Hematological: Denies adenopathy. Easy  bruising, personal or family bleeding history  Psychiatric/Behavioral: Denies suicidal ideation, mood changes, confusion, nervousness, sleep disturbance and agitation  Past Medical History  Diagnosis Date  . Essential hypertension, benign   . Crohn's disease   . Spinal stenosis   . PSVT (paroxysmal supraventricular tachycardia)   . History of TIAs   . GERD (gastroesophageal reflux disease)   . Carotid artery disease     RICA 50-69% 12/09  . PONV (postoperative nausea and vomiting)     years ago had N/V, but not recently  . Myocardial infarction   . COPD (chronic obstructive pulmonary disease)   . Thyroid mass   . Incontinence of urine   . Anxiety   . Arthritis   . Atrial fibrillation     Postoperative - suboptimal Coumadin candidate  . CAD (coronary artery disease), native coronary artery   . Port-a-cath in place 01/22/2012  . 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 Carcinoma of tonsillar pillars (anterior) (posterior)     XRT and resection   Past Surgical History  Procedure Laterality Date  . Left thoracotomy with wedge resection  2008    Left lower lobe  . Right tonsillectomy  2005  . Left thyroidectomy  2006  . Left breast biopsy  2011  . US echocardiography    . Cholecystectomy    . Cardiovascular stress test    . Abdominal hysterectomy    . Thyroidectomy  08/30/2011    Procedure: THYROIDECTOMY;  Surgeon: Cecil Cranker;  Location: [facility] OR;  Service: ENT;  Laterality: Right;  . Esophagogastroduodenoscopy (egd) with esophageal dilation N/A 05/06/2013    Procedure: ESOPHAGOGASTRODUODENOSCOPY (EGD) WITH ESOPHAGEAL DILATION;  Surgeon: Rogene Houston, MD;  Location: [facility];  Service: Endoscopy;  Laterality: N/A;  230   Social History:  reports that she quit smoking about 20 years ago. Her smoking use included Cigarettes. She smoked 0.00 packs per day. She has never used smokeless tobacco. She reports that she does not drink  alcohol or use illicit drugs.   Allergies  Allergen Reactions  . Codeine Nausea And Vomiting  . Demerol Other (See Comments)    Blood pressure  . Morphine And Related Nausea And Vomiting  . Penicillins Hives and Swelling    Family History  Problem Relation Age of Onset  . Coronary artery disease Father     Died age 26  . Dementia Mother     Prior to Admission medications   Medication Sig Start Date End Date Taking? Authorizing Provider  albuterol (PROVENTIL) (2.5 MG/3ML) 0.083% nebulizer solution Take 2.5 mg by nebulization 4 (four) times daily.   Yes Historical Provider, MD  ALPRAZolam Duanne Moron) 0.25 MG tablet Take 0.25 mg by mouth at bedtime as needed. For sleep.   Yes Historical Provider, MD  atenolol (TENORMIN) 25 MG tablet TAKE (1) TABLET BY MOUTH ONCE DAILY. 09/22/13  Yes Satira Sark, MD  calcium carbonate (TUMS - DOSED IN MG ELEMENTAL CALCIUM) 500 MG chewable tablet Chew 1 tablet by mouth 2 (two) times daily as needed for heartburn.    Yes Historical Provider, MD  fluticasone (FLONASE) 50 MCG/ACT nasal spray Place 2 sprays into the nose daily.     Yes Historical Provider, MD  furosemide (LASIX) 40 MG tablet Take 40 mg by mouth daily. 20mg  in am and 20mg  in pm 04/16/13  Yes Satira Sark, MD  ipratropium (ATROVENT) 0.02 % nebulizer solution Take 250 mcg by nebulization 4 (four) times daily.   Yes Historical Provider, MD  levothyroxine (SYNTHROID, LEVOTHROID) 75 MCG tablet Take 75 mcg by mouth every morning.   Yes Historical Provider, MD  lidocaine (LIDODERM) 5 % Place 1 patch onto the skin daily as needed (for pain). Remove & Discard patch within 12 hours or as directed by MD. Pain.   Yes Historical Provider, MD  mesalamine (PENTASA) 250 MG CR capsule Take 1,000 mg by mouth 4 (four) times daily.   Yes Historical Provider, MD  OMEGA 3 1000 MG CAPS Take 1,000 mg by mouth daily.    Yes Historical Provider, MD  potassium chloride (K-DUR) 10 MEQ tablet Take 1 tablet (10 mEq  total) by mouth daily. 04/16/13  Yes Satira Sark, MD  PROAIR HFA 108 [phone] BASE) MCG/ACT inhaler Inhale 2 puffs into the lungs every 6 (six) hours as needed for wheezing or shortness of breath.  07/10/12  Yes Historical Provider, MD   Physical Exam: Filed Vitals:   11/13/13 0745  BP: 110/67  Pulse: 122  Temp: 97.8 F (36.6 C)  Resp: 20     General: Well developed, well nourished, NAD, appears stated age  HEENT: NCAT, PERRLA, EOMI, Anicteic Sclera, mucous membranes moist. No pharyngeal erythema or exudates  Neck: Supple, no JVD, no masses  Cardiovascular: S1 S2 auscultated, no rubs, murmurs or gallops. Irregularly irregular.  Respiratory: Scattered wheezing noted throughout lung fields as well as Rales in the bases.  Abdomen: Soft, nontender, nondistended, + bowel sounds  Extremities: warm dry without cyanosis clubbing.  +2pitting edema  Neuro: AAOx3, cranial nerves grossly intact. Strength 5/5 in patient's upper and lower extremities bilaterally  Skin: Without rashes exudates or nodules  Psych: Normal affect and demeanor with intact judgement and insight  Labs on  Admission:  Basic Metabolic Panel:  Recent Labs Lab 11/13/13 0343  NA 137  K 3.8  CL 93*  CO2 34*  GLUCOSE 106*  BUN 11  CREATININE 0.67  CALCIUM 8.3*   Liver Function Tests:  Recent Labs Lab 11/13/13 0343  AST 15  ALT 8  ALKPHOS 86  BILITOT 0.4  PROT 6.7  ALBUMIN 3.2*   No results found for this basename: LIPASE, AMYLASE,  in the last 168 hours No results found for this basename: AMMONIA,  in the last 168 hours CBC:  Recent Labs Lab 11/13/13 0343  WBC 4.2  NEUTROABS 3.1  HGB 14.4  HCT 43.7  MCV 94.4  PLT 206   Cardiac Enzymes:  Recent Labs Lab 11/13/13 0343  TROPONINI <0.30    BNP (last 3 results)  Recent Labs  04/13/13 0055 11/13/13 0343  PROBNP 1730.0* 1272.0*   CBG: No results found for this basename: GLUCAP,  in the last 168 hours  Radiological Exams on  Admission: Dg Chest 2 View  11/13/2013   CLINICAL DATA:  Shortness of breath  EXAM: CHEST  2 VIEW  COMPARISON:  Prior CT from 10/14/2013  FINDINGS: Moderate cardiomegaly and is stable as compared to prior study. Prominent atherosclerotic calcifications noted within the aortic arch. Right-sided Port-A-Cath in place.  Lungs are normally inflated. Chronic changes again seen along the left pleural consistent with prior left thoracotomy. No pneumothorax, pulmonary edema, or definite pleural effusion. Chronic bibasilar interstitial markings are similar to prior studies.  No acute osseous abnormality. Chronic compression deformity is again noted within the thoracic spine.  IMPRESSION: Stable radiographic appearance of the chest with no acute cardiopulmonary process identified.   Electronically Signed   By: Jeannine Boga M.D.   On: 11/13/2013 04:32    EKG: Independently reviewed. Sinus rhythm, PACs, rate 74.  Assessment/Plan COPD exacerbation Patient be admitted to medical floor. At this time her chest x-ray shows no acute cardiopulmonary process.  Patient also has a history of lung cancer. Patient is likely having an exacerbation secondary to upper respiratory infection likely viral in cause. Currently she is afebrile no leukocytosis. Will continue her on DuoNeb's, Solu-Medrol, supplemental oxygen to maintain oxygen saturations above 92%.    Atrial fibrillation with RVR This is likely secondary to her COPD exacerbation. Dr. Willey Blade has ordered diltiazem. An order repeat EKG. Patient will continue on telemetry monitor which currently shows her having paroxysmal atrial fibrillation. Patient is on no anticoagulation for this. Will also obtain a TSH level as well as magnesium level.  Diastolic CHF Patient does not appear to be in exacerbation at this time. Last echocardiogram done in July 2014 showing an EF of 55% however a grade 1 diastolic dysfunction. Will continue her Lasix. We'll also monitor her input  and output, as well as place her on fluid restriction of 1230mL per day, and monitor daily weights.  Carotid artery disease Currently stable. Patient denies any current chest pain at this time. Will continue to monitor.  Hypothyroidism Will check a TSH level. We'll continue her on Synthroid.  Essential hypertension, benign Currently controlled. Will continue her atenolol as well as Lasix.  Lung cancer Differentiated basaloid squamous cell. Currently stable.  Nausea, vomiting, diarrhea Likely viral cause. Will continue to monitor patient.  Will not give any antibiotics at this time as patient is afebrile no leukocytosis. Will check for CDiff, however, unlikely as patient denies any antibiotics and further diarrhea.  DVT prophylaxis: Lovenox  Code Status: Full  Condition: Guarded  Family Communication: None at bedside. Admission, patients condition and plan of care including tests being ordered have been discussed with the patient and who indicates understanding and agrees with the plan and Code Status.  Disposition Plan: Admitted   Time spent: 60 minutes  Seng Larch D.O. [facility] Pager [phone]  If 7PM-7AM, please contact night-coverage [email or web address] Password [facility] 11/13/2013, 9:09 AM

## 2013-11-13 NOTE — Progress Notes (Signed)
Utilization review Completed Madge Therrien RN BSN   

## 2013-11-14 LAB — EXPECTORATED SPUTUM ASSESSMENT W REFEX TO RESP CULTURE

## 2013-11-14 LAB — CBC
HCT: 42.3 % (ref 36.0–46.0)
Hemoglobin: 13.6 g/dL (ref 12.0–15.0)
MCH: 30.6 pg (ref 26.0–34.0)
MCHC: 32.2 g/dL (ref 30.0–36.0)
MCV: 95.1 fL (ref 78.0–100.0)
Platelets: 214 10*3/uL (ref 150–400)
RBC: 4.45 MIL/uL (ref 3.87–5.11)
RDW: 14.8 % (ref 11.5–15.5)
WBC: 5.4 10*3/uL (ref 4.0–10.5)

## 2013-11-14 LAB — BASIC METABOLIC PANEL
BUN: 18 mg/dL (ref 6–23)
CALCIUM: 8 mg/dL — AB (ref 8.4–10.5)
CO2: 35 meq/L — AB (ref 19–32)
Chloride: 95 mEq/L — ABNORMAL LOW (ref 96–112)
Creatinine, Ser: 0.68 mg/dL (ref 0.50–1.10)
GFR calc non Af Amer: 78 mL/min — ABNORMAL LOW (ref >90)
Glucose, Bld: 106 mg/dL — ABNORMAL HIGH (ref 70–99)
Potassium: 3.6 mEq/L — ABNORMAL LOW (ref 3.7–5.3)
SODIUM: 138 meq/L (ref 137–147)

## 2013-11-14 LAB — EXPECTORATED SPUTUM ASSESSMENT W GRAM STAIN, RFLX TO RESP C

## 2013-11-14 LAB — CLOSTRIDIUM DIFFICILE BY PCR: Toxigenic C. Difficile by PCR: NEGATIVE

## 2013-11-14 MED ORDER — POTASSIUM CHLORIDE CRYS ER 20 MEQ PO TBCR
20.0000 meq | EXTENDED_RELEASE_TABLET | Freq: Two times a day (BID) | ORAL | Status: DC
  Administered 2013-11-14 – 2013-11-18 (×8): 20 meq via ORAL
  Filled 2013-11-14 (×8): qty 1

## 2013-11-14 MED ORDER — METHYLPREDNISOLONE SODIUM SUCC 125 MG IJ SOLR
60.0000 mg | Freq: Four times a day (QID) | INTRAMUSCULAR | Status: DC
  Administered 2013-11-14 – 2013-11-17 (×11): 60 mg via INTRAVENOUS
  Filled 2013-11-14 (×11): qty 2

## 2013-11-14 MED ORDER — TRAZODONE HCL 50 MG PO TABS: 50.0000 mg | ORAL_TABLET | Freq: Every evening | ORAL | Status: DC | PRN

## 2013-11-14 NOTE — Progress Notes (Signed)
NAME:  [name], Gwenlyn                ACCOUNT NO.:  [id number]  MEDICAL RECORD NO.:  [id number]  LOCATION:  E423                          FACILITY:  [facility]  PHYSICIAN:  Paula Compton. Willey Blade, MD       DATE OF BIRTH:  11-Dec-1926  DATE OF PROCEDURE:  11/14/2013 DATE OF DISCHARGE:                                PROGRESS NOTE   [name] was admitted yesterday by the hospitalist for a COPD exacerbation.  She is breathing more comfortably.  She complains of poor sleep last night.  She has been treated with IV corticosteroids and inhaled bronchodilators.  Her white counts have been normal at 4.2 and 5.4.  Her chest x-ray revealed no acute infiltrate.  She is alert and coughing intermittently this morning.  She had an episode of paroxysmal atrial fibrillation yesterday, but converted to sinus rhythm and is in sinus rhythm now on telemetry. Her heart rate is in the 70s.  PHYSICAL EXAMINATION:  GENERAL:  She has been afebrile. VITAL SIGNS:  Oxygen saturations have ranged from 87-96. LUNGS:  Reveal bilateral wheezes. HEART:  Regular.  No murmurs. ABDOMEN:  Soft and nontender. EXTREMITIES:  Reveal no cyanosis or edema.  IMPRESSION AND PLAN: 1. Chronic obstructive pulmonary disease exacerbation, improving.     Continue Solu-Medrol DuoNebs, and supplemental oxygen. 2. Borderline potassium, increase potassium to 20 mEq b.i.d. 3. 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  Recent CT     scan has revealed no evidence of recurrence. 4. Hypothyroidism.  She is euthyroid with a TSH of 3.28. 5. Insomnia, trazodone if needed.     Paula Compton. Willey Blade, MD     ROF/MEDQ  D:  11/14/2013  T:  11/14/2013  Job:  [id number]

## 2013-11-15 MED ORDER — ALPRAZOLAM 0.25 MG PO TABS
0.2500 mg | ORAL_TABLET | Freq: Three times a day (TID) | ORAL | Status: DC | PRN
  Administered 2013-11-15 – 2013-11-17 (×6): 0.25 mg via ORAL
  Filled 2013-11-15 (×6): qty 1

## 2013-11-15 MED ORDER — IPRATROPIUM-ALBUTEROL 0.5-2.5 (3) MG/3ML IN SOLN
3.0000 mL | Freq: Four times a day (QID) | RESPIRATORY_TRACT | Status: DC
  Administered 2013-11-15 – 2013-11-18 (×13): 3 mL via RESPIRATORY_TRACT
  Filled 2013-11-15 (×13): qty 3

## 2013-11-15 NOTE — Progress Notes (Signed)
NAME:  [name], [name]                ACCOUNT NO.:  [id number]  MEDICAL RECORD NO.:  [id number]  LOCATION:  Y388                          FACILITY:  [facility]  PHYSICIAN:  Paula Compton. Willey Blade, MD       DATE OF BIRTH:  April 12, 1927  DATE OF PROCEDURE:  11/15/2013 DATE OF DISCHARGE:                                PROGRESS NOTE   SUBJECTIVE:  Mrs. Haxton wheezing has improved.  She continues to cough.  She has very little sputum production.  She has had no fever.  OBJECTIVE:  VITAL SIGNS:  Temperature 97.7, pulse 67, respirations 24, blood pressure 177/62, oxygen saturation 90%. GENERAL:  Alert, in no distress. LUNGS:  Bilateral wheezes. HEART:  Regular with no murmurs. ABDOMEN:  Soft and nontender. EXTREMITIES:  No edema.  She is in sinus rhythm in the 60s on telemetry.  IMPRESSION/PLAN: 1. Chronic obstructive pulmonary disease exacerbation.  Continue Solu-     Medrol, DuoNeb treatments, and supplemental oxygen. 2. Hypokalemia.  Continue potassium supplements. 3. Metastatic tonsillar carcinoma.  No evidence of recurrence. 4. She has had a negative stool Clostridium difficile 5. Hypothyroidism.  She is euthyroid with a TSH of 3.28.     Paula Compton. Willey Blade, MD     ROF/MEDQ  D:  11/15/2013  T:  11/15/2013  Job:  [id number]

## 2013-11-16 LAB — BASIC METABOLIC PANEL
BUN: 19 mg/dL (ref 6–23)
CO2: 36 mEq/L — ABNORMAL HIGH (ref 19–32)
CREATININE: 0.61 mg/dL (ref 0.50–1.10)
Calcium: 8.4 mg/dL (ref 8.4–10.5)
Chloride: 95 mEq/L — ABNORMAL LOW (ref 96–112)
GFR, EST NON AFRICAN AMERICAN: 81 mL/min — AB (ref >90)
Glucose, Bld: 123 mg/dL — ABNORMAL HIGH (ref 70–99)
Potassium: 4.6 mEq/L (ref 3.7–5.3)
Sodium: 140 mEq/L (ref 137–147)

## 2013-11-16 MED ORDER — PHENOL 1.4 % MT LIQD
2.0000 | OROMUCOSAL | Status: DC | PRN
  Administered 2013-11-16: 2 via OROMUCOSAL
  Filled 2013-11-16: qty 177

## 2013-11-16 MED ORDER — BUDESONIDE-FORMOTEROL FUMARATE 160-4.5 MCG/ACT IN AERO
2.0000 | INHALATION_SPRAY | Freq: Two times a day (BID) | RESPIRATORY_TRACT | Status: DC
  Administered 2013-11-16 – 2013-11-18 (×4): 2 via RESPIRATORY_TRACT
  Filled 2013-11-16: qty 6

## 2013-11-16 MED ORDER — GUAIFENESIN ER 600 MG PO TB12
1200.0000 mg | ORAL_TABLET | Freq: Two times a day (BID) | ORAL | Status: DC
Start: 2013-11-16 — End: 2013-11-18
  Administered 2013-11-16 – 2013-11-18 (×5): 1200 mg via ORAL
  Filled 2013-11-16 (×5): qty 2

## 2013-11-16 MED ORDER — LIDOCAINE 5 % EX PTCH
MEDICATED_PATCH | CUTANEOUS | Status: AC
  Filled 2013-11-16: qty 1

## 2013-11-16 MED ORDER — LEVOFLOXACIN IN D5W 500 MG/100ML IV SOLN
500.0000 mg | INTRAVENOUS | Status: DC
  Administered 2013-11-16 – 2013-11-17 (×2): 500 mg via INTRAVENOUS
  Filled 2013-11-16 (×2): qty 100

## 2013-11-16 NOTE — Care Management Note (Addendum)
    Page 1 of 2   11/18/2013     9:24:29 AM   CARE MANAGEMENT NOTE 11/18/2013  Patient:  [name], [name]   Account Number:  [id number]  Date Initiated:  11/16/2013  Documentation initiated by:  Theophilus Kinds  Subjective/Objective Assessment:   Pt admitted from home with COPD and CHF. Pt lives alone and has a son that comes and calls daily. Pt has a PCS aide 3 hours a day/6 days a week with [employer]. Pt has a neb machine and home O2 with [employer] and has a cane.     Action/Plan:   PT stated that she did not want to go to NH at discharge. May benefit from [facility] RN at discharge. Will continue to follow for discharge planning needs.   Anticipated DC Date:  11/19/2013   Anticipated DC Plan:  [place]  CM consult      [facility] Choice  HOME HEALTH   Choice offered to / List presented to:  C-1 Patient        [place] arranged  HH-1 RN  [place].   Status of service:  Completed, signed off Medicare Important Message given?  YES (If response is "NO", the following Medicare IM given date fields will be blank) Date Medicare IM given:  11/18/2013 Date Additional Medicare IM given:    Discharge Disposition:  [place]  Per UR Regulation:    If discussed at Long Length of Stay Meetings, dates discussed:    Comments:  11/18/13 0920 Christinia Gully, RN BSN CM Pt discharged home today with [facility] RN and PT per pts choice (pt given list of [facility] agencies). Romualdo Bolk of [facility] is aware and will collect the pts information from the chart. [place] services to start within 48 hours of discharge. No DME needs noted. Pt and pts nurse aware of discharge arrangements.  11/16/13 [name], RN BSN CM

## 2013-11-16 NOTE — Progress Notes (Addendum)
Resting in bed with eyes closed with no complaints of any distress.

## 2013-11-16 NOTE — Progress Notes (Signed)
Subjective: This  is a progress note on [name] who is a patient of Dr. Knute Neu. She is admitted with COPD exacerbation. She says she does not feel well. She is coughing and coughing up some sputum. She has not had any fever. She is still more short of breath than usual.  Objective: Vital signs in last 24 hours: Temp:  [97.8 F (36.6 C)-98.1 F (36.7 C)] 98 F (36.7 C) (02/16 0705) Pulse Rate:  [54-79] 54 (02/16 0705) Resp:  [20-22] 20 (02/16 0705) BP: (154-164)/(55-83) 155/75 mmHg (02/16 0705) SpO2:  [91 %-97 %] 97 % (02/16 0715) Weight:  [88.905 kg (196 lb)] 88.905 kg (196 lb) (02/16 0705) Weight change:  Last BM Date: 11/15/13  Intake/Output from previous day: 02/15 0701 - 02/16 0700 In: 600 [P.O.:600] Out: 2150 [Urine:2150]  PHYSICAL EXAM General appearance: alert, cooperative and moderate distress Resp: rhonchi bilaterally and wheezes bilaterally Cardio: irregularly irregular rhythm GI: soft, non-tender; bowel sounds normal; no masses,  no organomegaly Extremities: extremities normal, atraumatic, no cyanosis or edema  Lab Results:    Basic Metabolic Panel:  Recent Labs  11/14/13 0631 11/16/13 0504  NA 138 140  K 3.6* 4.6  CL 95* 95*  CO2 35* 36*  GLUCOSE 106* 123*  BUN 18 19  CREATININE 0.68 0.61  CALCIUM 8.0* 8.4   Liver Function Tests: No results found for this basename: AST, ALT, ALKPHOS, BILITOT, PROT, ALBUMIN,  in the last 72 hours No results found for this basename: LIPASE, AMYLASE,  in the last 72 hours No results found for this basename: AMMONIA,  in the last 72 hours CBC:  Recent Labs  11/14/13 0631  WBC 5.4  HGB 13.6  HCT 42.3  MCV 95.1  PLT 214   Cardiac Enzymes: No results found for this basename: CKTOTAL, CKMB, CKMBINDEX, TROPONINI,  in the last 72 hours BNP: No results found for this basename: PROBNP,  in the last 72 hours D-Dimer: No results found for this basename: DDIMER,  in the last 72 hours CBG: No results found  for this basename: GLUCAP,  in the last 72 hours Hemoglobin A1C: No results found for this basename: HGBA1C,  in the last 72 hours Fasting Lipid Panel: No results found for this basename: CHOL, HDL, LDLCALC, TRIG, CHOLHDL, LDLDIRECT,  in the last 72 hours Thyroid Function Tests: No results found for this basename: TSH, T4TOTAL, FREET4, T3FREE, THYROIDAB,  in the last 72 hours Anemia Panel: No results found for this basename: VITAMINB12, FOLATE, FERRITIN, TIBC, IRON, RETICCTPCT,  in the last 72 hours Coagulation: No results found for this basename: LABPROT, INR,  in the last 72 hours Urine Drug Screen: Drugs of Abuse  No results found for this basename: labopia, cocainscrnur, labbenz, amphetmu, thcu, labbarb    Alcohol Level: No results found for this basename: ETH,  in the last 72 hours Urinalysis: No results found for this basename: COLORURINE, APPERANCEUR, LABSPEC, PHURINE, GLUCOSEU, HGBUR, BILIRUBINUR, KETONESUR, PROTEINUR, UROBILINOGEN, NITRITE, LEUKOCYTESUR,  in the last 72 hours Misc. Labs:  ABGS No results found for this basename: PHART, PCO2, PO2ART, TCO2, HCO3,  in the last 72 hours CULTURES Recent Results (from the past 240 hour(s))  CLOSTRIDIUM DIFFICILE BY PCR     Status: None   Collection Time    11/14/13 10:15 AM      Result Value Ref Range Status   C difficile by pcr NEGATIVE  NEGATIVE Final  CULTURE, EXPECTORATED SPUTUM-ASSESSMENT     Status: None   Collection Time  11/14/13 10:15 AM      Result Value Ref Range Status   Specimen Description SPUTUM   Final   Special Requests NONE   Final   Sputum evaluation     Final   Value: THIS SPECIMEN IS ACCEPTABLE. RESPIRATORY CULTURE REPORT TO FOLLOW.   Report Status 11/14/2013 FINAL   Final  CULTURE, RESPIRATORY (NON-EXPECTORATED)     Status: None   Collection Time    11/14/13 10:15 AM      Result Value Ref Range Status   Specimen Description SPUTUM   Final   Special Requests NONE   Final   Gram Stain     Final    Value: MODERATE WBC PRESENT, PREDOMINANTLY PMN     FEW SQUAMOUS EPITHELIAL CELLS PRESENT     ABUNDANT GRAM POSITIVE COCCI IN PAIRS     IN CLUSTERS     Performed at [facility]   Culture     Final   Value: Culture reincubated for better growth     Performed at [facility]   Report Status PENDING   Incomplete   Studies/Results: No results found.  Medications:  Prior to Admission:  Prescriptions prior to admission  Medication Sig Dispense Refill  . albuterol (PROVENTIL) (2.5 MG/3ML) 0.083% nebulizer solution Take 2.5 mg by nebulization 4 (four) times daily.      Marland Kitchen ALPRAZolam (XANAX) 0.25 MG tablet Take 0.25 mg by mouth at bedtime as needed. For sleep.      Marland Kitchen atenolol (TENORMIN) 25 MG tablet TAKE (1) TABLET BY MOUTH ONCE DAILY.  30 tablet  0  . calcium carbonate (TUMS - DOSED IN MG ELEMENTAL CALCIUM) 500 MG chewable tablet Chew 1 tablet by mouth 2 (two) times daily as needed for heartburn.       . fluticasone (FLONASE) 50 MCG/ACT nasal spray Place 2 sprays into the nose daily.        . furosemide (LASIX) 40 MG tablet Take 40 mg by mouth daily. 20mg  in am and 20mg  in pm      . ipratropium (ATROVENT) 0.02 % nebulizer solution Take 250 mcg by nebulization 4 (four) times daily.      Marland Kitchen levothyroxine (SYNTHROID, LEVOTHROID) 75 MCG tablet Take 75 mcg by mouth every morning.      . lidocaine (LIDODERM) 5 % Place 1 patch onto the skin daily as needed (for pain). Remove & Discard patch within 12 hours or as directed by MD. Pain.      . mesalamine (PENTASA) 250 MG CR capsule Take 1,000 mg by mouth 4 (four) times daily.      . OMEGA 3 1000 MG CAPS Take 1,000 mg by mouth daily.       . potassium chloride (K-DUR) 10 MEQ tablet Take 1 tablet (10 mEq total) by mouth daily.  90 tablet  1  . PROAIR HFA 108 (90 BASE) MCG/ACT inhaler Inhale 2 puffs into the lungs every 6 (six) hours as needed for wheezing or shortness of breath.        Scheduled: . atenolol  25 mg Oral BID  .  budesonide-formoterol  2 puff Inhalation BID  . enoxaparin (LOVENOX) injection  40 mg Subcutaneous Q24H  . fluticasone  2 spray Each Nare Daily  . furosemide  40 mg Oral Daily  . guaiFENesin  1,200 mg Oral BID  . ipratropium-albuterol  3 mL Nebulization QID  . levofloxacin (LEVAQUIN) IV  500 mg Intravenous Q24H  . levothyroxine  75 mcg Oral QAC  breakfast  . mesalamine  1,000 mg Oral QID  . methylPREDNISolone (SOLU-MEDROL) injection  60 mg Intravenous Q6H  . omega-3 acid ethyl esters  1,000 mg Oral Daily  . potassium chloride  20 mEq Oral BID  . sodium chloride  3 mL Intravenous Q12H  . sodium chloride  3 mL Intravenous Q12H   Continuous:  KXF:GHWEXH chloride, acetaminophen, acetaminophen, ALPRAZolam, calcium carbonate, ipratropium-albuterol, lidocaine, sodium chloride, traZODone  Assesment: She was admitted with COPD exacerbation. I think she is somewhat better by description but she is still short of breath with minimal exertion. She is currently on steroids. Since she's coughing up some sputum now which is purulent I'm going to go in and put her on antibiotic.  She has a history of lung cancer but no evidence of recurrence.  She has chronic atrial fibrillation stable  She has hypertension which is well-controlled  Principal Problem:   COPD exacerbation Active Problems:   Carotid artery disease   Essential hypertension, benign   Nausea with vomiting   Diarrhea    Plan: I'm going to add a flutter valve. Continue with steroids. Levaquin. And Symbicort.    LOS: 3 days   Syesha Thaw L 11/16/2013, 8:26 AM

## 2013-11-17 LAB — CULTURE, RESPIRATORY: CULTURE: NORMAL

## 2013-11-17 LAB — CULTURE, RESPIRATORY W GRAM STAIN

## 2013-11-17 MED ORDER — LIDOCAINE 5 % EX PTCH
MEDICATED_PATCH | CUTANEOUS | Status: AC
  Filled 2013-11-17: qty 1

## 2013-11-17 MED ORDER — LISINOPRIL 10 MG PO TABS
10.0000 mg | ORAL_TABLET | Freq: Every day | ORAL | Status: DC
  Administered 2013-11-17 – 2013-11-18 (×2): 10 mg via ORAL
  Filled 2013-11-17 (×2): qty 1

## 2013-11-17 MED ORDER — PREDNISONE 20 MG PO TABS
40.0000 mg | ORAL_TABLET | Freq: Every day | ORAL | Status: DC
  Administered 2013-11-17 – 2013-11-18 (×2): 40 mg via ORAL
  Filled 2013-11-17 (×2): qty 2

## 2013-11-17 MED ORDER — LEVOFLOXACIN 500 MG PO TABS
500.0000 mg | ORAL_TABLET | Freq: Every day | ORAL | Status: DC
  Administered 2013-11-18: 500 mg via ORAL
  Filled 2013-11-17: qty 1

## 2013-11-17 MED ORDER — ONDANSETRON HCL 4 MG/2ML IJ SOLN
4.0000 mg | Freq: Four times a day (QID) | INTRAMUSCULAR | Status: DC | PRN
  Administered 2013-11-17: 4 mg via INTRAVENOUS
  Filled 2013-11-17: qty 2

## 2013-11-17 NOTE — Progress Notes (Signed)
Subjective: She says she feels somewhat better. She has no new complaints.  Objective: Vital signs in last 24 hours: Temp:  [97.7 F (36.5 C)-98.3 F (36.8 C)] 98.2 F (36.8 C) (02/17 0432) Pulse Rate:  [57-74] 57 (02/17 0432) Resp:  [18-20] 18 (02/17 0432) BP: (168-179)/(56-63) 179/63 mmHg (02/17 0432) SpO2:  [91 %-96 %] 95 % (02/17 0713) Weight:  [89.1 kg (196 lb 6.9 oz)] 89.1 kg (196 lb 6.9 oz) (02/17 0432) Weight change:  Last BM Date: 11/15/13  Intake/Output from previous day: 02/16 0701 - 02/17 0700 In: 820 [P.O.:720; IV Piggyback:100] Out: 550 [Urine:550]  PHYSICAL EXAM General appearance: alert, cooperative and mild distress Resp: rhonchi bilaterally Cardio: irregularly irregular rhythm GI: soft, non-tender; bowel sounds normal; no masses,  no organomegaly Extremities: extremities normal, atraumatic, no cyanosis or edema  Lab Results:    Basic Metabolic Panel:  Recent Labs  11/16/13 0504  NA 140  K 4.6  CL 95*  CO2 36*  GLUCOSE 123*  BUN 19  CREATININE 0.61  CALCIUM 8.4   Liver Function Tests: No results found for this basename: AST, ALT, ALKPHOS, BILITOT, PROT, ALBUMIN,  in the last 72 hours No results found for this basename: LIPASE, AMYLASE,  in the last 72 hours No results found for this basename: AMMONIA,  in the last 72 hours CBC: No results found for this basename: WBC, NEUTROABS, HGB, HCT, MCV, PLT,  in the last 72 hours Cardiac Enzymes: No results found for this basename: CKTOTAL, CKMB, CKMBINDEX, TROPONINI,  in the last 72 hours BNP: No results found for this basename: PROBNP,  in the last 72 hours D-Dimer: No results found for this basename: DDIMER,  in the last 72 hours CBG: No results found for this basename: GLUCAP,  in the last 72 hours Hemoglobin A1C: No results found for this basename: HGBA1C,  in the last 72 hours Fasting Lipid Panel: No results found for this basename: CHOL, HDL, LDLCALC, TRIG, CHOLHDL, LDLDIRECT,  in the last  72 hours Thyroid Function Tests: No results found for this basename: TSH, T4TOTAL, FREET4, T3FREE, THYROIDAB,  in the last 72 hours Anemia Panel: No results found for this basename: VITAMINB12, FOLATE, FERRITIN, TIBC, IRON, RETICCTPCT,  in the last 72 hours Coagulation: No results found for this basename: LABPROT, INR,  in the last 72 hours Urine Drug Screen: Drugs of Abuse  No results found for this basename: labopia, cocainscrnur, labbenz, amphetmu, thcu, labbarb    Alcohol Level: No results found for this basename: ETH,  in the last 72 hours Urinalysis: No results found for this basename: COLORURINE, APPERANCEUR, LABSPEC, PHURINE, GLUCOSEU, HGBUR, BILIRUBINUR, KETONESUR, PROTEINUR, UROBILINOGEN, NITRITE, LEUKOCYTESUR,  in the last 72 hours Misc. Labs:  ABGS No results found for this basename: PHART, PCO2, PO2ART, TCO2, HCO3,  in the last 72 hours CULTURES Recent Results (from the past 240 hour(s))  CLOSTRIDIUM DIFFICILE BY PCR     Status: None   Collection Time    11/14/13 10:15 AM      Result Value Ref Range Status   C difficile by pcr NEGATIVE  NEGATIVE Final  CULTURE, EXPECTORATED SPUTUM-ASSESSMENT     Status: None   Collection Time    11/14/13 10:15 AM      Result Value Ref Range Status   Specimen Description SPUTUM   Final   Special Requests NONE   Final   Sputum evaluation     Final   Value: THIS SPECIMEN IS ACCEPTABLE. RESPIRATORY CULTURE REPORT TO FOLLOW.  Report Status 11/14/2013 FINAL   Final  CULTURE, RESPIRATORY (NON-EXPECTORATED)     Status: None   Collection Time    11/14/13 10:15 AM      Result Value Ref Range Status   Specimen Description SPUTUM   Final   Special Requests NONE   Final   Gram Stain     Final   Value: MODERATE WBC PRESENT, PREDOMINANTLY PMN     FEW SQUAMOUS EPITHELIAL CELLS PRESENT     ABUNDANT GRAM POSITIVE COCCI IN PAIRS     IN CLUSTERS     Performed at [facility]   Culture     Final   Value: NORMAL OROPHARYNGEAL FLORA      Performed at [facility]   Report Status PENDING   Incomplete   Studies/Results: No results found.  Medications:  Prior to Admission:  Prescriptions prior to admission  Medication Sig Dispense Refill  . albuterol (PROVENTIL) (2.5 MG/3ML) 0.083% nebulizer solution Take 2.5 mg by nebulization 4 (four) times daily.      Marland Kitchen ALPRAZolam (XANAX) 0.25 MG tablet Take 0.25 mg by mouth at bedtime as needed. For sleep.      Marland Kitchen atenolol (TENORMIN) 25 MG tablet TAKE (1) TABLET BY MOUTH ONCE DAILY.  30 tablet  0  . calcium carbonate (TUMS - DOSED IN MG ELEMENTAL CALCIUM) 500 MG chewable tablet Chew 1 tablet by mouth 2 (two) times daily as needed for heartburn.       . fluticasone (FLONASE) 50 MCG/ACT nasal spray Place 2 sprays into the nose daily.        . furosemide (LASIX) 40 MG tablet Take 40 mg by mouth daily. 20mg  in am and 20mg  in pm      . ipratropium (ATROVENT) 0.02 % nebulizer solution Take 250 mcg by nebulization 4 (four) times daily.      Marland Kitchen levothyroxine (SYNTHROID, LEVOTHROID) 75 MCG tablet Take 75 mcg by mouth every morning.      . lidocaine (LIDODERM) 5 % Place 1 patch onto the skin daily as needed (for pain). Remove & Discard patch within 12 hours or as directed by MD. Pain.      . mesalamine (PENTASA) 250 MG CR capsule Take 1,000 mg by mouth 4 (four) times daily.      . OMEGA 3 1000 MG CAPS Take 1,000 mg by mouth daily.       . potassium chloride (K-DUR) 10 MEQ tablet Take 1 tablet (10 mEq total) by mouth daily.  90 tablet  1  . PROAIR HFA 108 (90 BASE) MCG/ACT inhaler Inhale 2 puffs into the lungs every 6 (six) hours as needed for wheezing or shortness of breath.        Scheduled: . atenolol  25 mg Oral BID  . budesonide-formoterol  2 puff Inhalation BID  . enoxaparin (LOVENOX) injection  40 mg Subcutaneous Q24H  . fluticasone  2 spray Each Nare Daily  . furosemide  40 mg Oral Daily  . guaiFENesin  1,200 mg Oral BID  . ipratropium-albuterol  3 mL Nebulization QID  .  levofloxacin (LEVAQUIN) IV  500 mg Intravenous Q24H  . levothyroxine  75 mcg Oral QAC breakfast  . lisinopril  10 mg Oral Daily  . mesalamine  1,000 mg Oral QID  . omega-3 acid ethyl esters  1,000 mg Oral Daily  . potassium chloride  20 mEq Oral BID  . predniSONE  40 mg Oral Q breakfast  . sodium chloride  3 mL Intravenous  Q12H  . sodium chloride  3 mL Intravenous Q12H   Continuous:  MCR:FVOHKG chloride, acetaminophen, acetaminophen, ALPRAZolam, calcium carbonate, ipratropium-albuterol, lidocaine, phenol, sodium chloride, traZODone  Assesment: She was admitted with COPD exacerbation and is improving but still short of breath and congested.  She has hypertension which is not controlled perhaps because of steroids.  She had nausea vomiting and diarrhea all of which are better.  She has a history of lung cancer with no evidence of recurrence on chest x-ray at this time Principal Problem:   COPD exacerbation Active Problems:   Carotid artery disease   Essential hypertension, benign   Nausea with vomiting   Diarrhea    Plan: I will add lisinopril. She will continue her other treatments. No other new medications. She will discontinue Solu-Medrol and start prednisone.    LOS: 4 days   [name] L 11/17/2013, 9:17 AM

## 2013-11-17 NOTE — Progress Notes (Signed)
PHARMACIST - PHYSICIAN COMMUNICATION DR:   Luan Pulling CONCERNING: Antibiotic IV to Oral Route Change Policy  RECOMMENDATION: This patient is receiving Levaquin by the intravenous route.  Based on criteria approved by the Pharmacy and Therapeutics Committee, the antibiotic(s) is/are being converted to the equivalent oral dose form(s).  DESCRIPTION: These criteria include:  Patient being treated for a respiratory tract infection, urinary tract infection, cellulitis or clostridium difficile associated diarrhea if on metronidazole  The patient is not neutropenic and does not exhibit a GI malabsorption state  The patient is eating (either orally or via tube) and/or has been taking other orally administered medications for a least 24 hours  The patient is improving clinically and has a Tmax < 100.5  If you have questions about this conversion, please contact the Pharmacy Department  [x]   [phone] )  [name] []   [phone] )  [name]  []   [phone] )  [name] []   [phone] )  [facility]   S. [place] Crane, PharmD

## 2013-11-18 DIAGNOSIS — B370 Candidal stomatitis: Secondary | ICD-10-CM | POA: Diagnosis not present

## 2013-11-18 MED ORDER — HEPARIN SOD (PORK) LOCK FLUSH 100 UNIT/ML IV SOLN
500.0000 [IU] | INTRAVENOUS | Status: AC | PRN
  Administered 2013-11-18: 500 [IU]
  Filled 2013-11-18: qty 5

## 2013-11-18 MED ORDER — METHYLPREDNISOLONE (PAK) 4 MG PO TABS: ORAL_TABLET | ORAL | Status: DC

## 2013-11-18 MED ORDER — LEVOFLOXACIN 500 MG PO TABS: 500.0000 mg | ORAL_TABLET | Freq: Every day | ORAL | Status: DC

## 2013-11-18 NOTE — Progress Notes (Addendum)
Patients RA stat 65% at rest.  Increased to 93% on 3L O2.  Educated patient that she will need to wear oxygen continuously until able to maintain higher stats independently.  CM aware and arranging for tank to be delivered for transport home.

## 2013-11-18 NOTE — Progress Notes (Signed)
UR chart review completed.  

## 2013-11-18 NOTE — Progress Notes (Signed)
Subjective: She says she feels better except she has thrush. She wants to go home.  Objective: Vital signs in last 24 hours: Temp:  [97.5 F (36.4 C)-98.8 F (37.1 C)] 97.5 F (36.4 C) (02/18 0700) Pulse Rate:  [62-76] 76 (02/18 0700) Resp:  [18-21] 18 (02/18 0700) BP: (117-143)/(61-70) 143/67 mmHg (02/18 0700) SpO2:  [92 %-97 %] 93 % (02/18 0735) Weight:  [89 kg (196 lb 3.4 oz)] 89 kg (196 lb 3.4 oz) (02/18 0700) Weight change: 0.095 kg (3.4 oz) Last BM Date: 11/16/13  Intake/Output from previous day: 02/17 0701 - 02/18 0700 In: 600 [P.O.:600] Out: 750 [Urine:750]  PHYSICAL EXAM General appearance: alert, cooperative and no distress Resp: rhonchi bilaterally Cardio: regular rate and rhythm, S1, S2 normal, no murmur, click, rub or gallop GI: soft, non-tender; bowel sounds normal; no masses,  no organomegaly Extremities: extremities normal, atraumatic, no cyanosis or edema  Lab Results:    Basic Metabolic Panel:  Recent Labs  11/16/13 0504  NA 140  K 4.6  CL 95*  CO2 36*  GLUCOSE 123*  BUN 19  CREATININE 0.61  CALCIUM 8.4   Liver Function Tests: No results found for this basename: AST, ALT, ALKPHOS, BILITOT, PROT, ALBUMIN,  in the last 72 hours No results found for this basename: LIPASE, AMYLASE,  in the last 72 hours No results found for this basename: AMMONIA,  in the last 72 hours CBC: No results found for this basename: WBC, NEUTROABS, HGB, HCT, MCV, PLT,  in the last 72 hours Cardiac Enzymes: No results found for this basename: CKTOTAL, CKMB, CKMBINDEX, TROPONINI,  in the last 72 hours BNP: No results found for this basename: PROBNP,  in the last 72 hours D-Dimer: No results found for this basename: DDIMER,  in the last 72 hours CBG: No results found for this basename: GLUCAP,  in the last 72 hours Hemoglobin A1C: No results found for this basename: HGBA1C,  in the last 72 hours Fasting Lipid Panel: No results found for this basename: CHOL, HDL,  LDLCALC, TRIG, CHOLHDL, LDLDIRECT,  in the last 72 hours Thyroid Function Tests: No results found for this basename: TSH, T4TOTAL, FREET4, T3FREE, THYROIDAB,  in the last 72 hours Anemia Panel: No results found for this basename: VITAMINB12, FOLATE, FERRITIN, TIBC, IRON, RETICCTPCT,  in the last 72 hours Coagulation: No results found for this basename: LABPROT, INR,  in the last 72 hours Urine Drug Screen: Drugs of Abuse  No results found for this basename: labopia, cocainscrnur, labbenz, amphetmu, thcu, labbarb    Alcohol Level: No results found for this basename: ETH,  in the last 72 hours Urinalysis: No results found for this basename: COLORURINE, APPERANCEUR, LABSPEC, PHURINE, GLUCOSEU, HGBUR, BILIRUBINUR, KETONESUR, PROTEINUR, UROBILINOGEN, NITRITE, LEUKOCYTESUR,  in the last 72 hours Misc. Labs:  ABGS No results found for this basename: PHART, PCO2, PO2ART, TCO2, HCO3,  in the last 72 hours CULTURES Recent Results (from the past 240 hour(s))  CLOSTRIDIUM DIFFICILE BY PCR     Status: None   Collection Time    11/14/13 10:15 AM      Result Value Ref Range Status   C difficile by pcr NEGATIVE  NEGATIVE Final  CULTURE, EXPECTORATED SPUTUM-ASSESSMENT     Status: None   Collection Time    11/14/13 10:15 AM      Result Value Ref Range Status   Specimen Description SPUTUM   Final   Special Requests NONE   Final   Sputum evaluation  Final   Value: THIS SPECIMEN IS ACCEPTABLE. RESPIRATORY CULTURE REPORT TO FOLLOW.   Report Status 11/14/2013 FINAL   Final  CULTURE, RESPIRATORY (NON-EXPECTORATED)     Status: None   Collection Time    11/14/13 10:15 AM      Result Value Ref Range Status   Specimen Description SPUTUM EXPECTORATED   Final   Special Requests NONE   Final   Gram Stain     Final   Value: MODERATE WBC PRESENT, PREDOMINANTLY PMN     FEW SQUAMOUS EPITHELIAL CELLS PRESENT     ABUNDANT GRAM POSITIVE COCCI IN PAIRS     IN CLUSTERS     Performed at [facility]    Culture     Final   Value: NORMAL OROPHARYNGEAL FLORA     Performed at [facility]   Report Status 11/17/2013 FINAL   Final   Studies/Results: No results found.  Medications:  Prior to Admission:  Prescriptions prior to admission  Medication Sig Dispense Refill  . albuterol (PROVENTIL) (2.5 MG/3ML) 0.083% nebulizer solution Take 2.5 mg by nebulization 4 (four) times daily.      Marland Kitchen ALPRAZolam (XANAX) 0.25 MG tablet Take 0.25 mg by mouth at bedtime as needed. For sleep.      Marland Kitchen atenolol (TENORMIN) 25 MG tablet TAKE (1) TABLET BY MOUTH ONCE DAILY.  30 tablet  0  . calcium carbonate (TUMS - DOSED IN MG ELEMENTAL CALCIUM) 500 MG chewable tablet Chew 1 tablet by mouth 2 (two) times daily as needed for heartburn.       . fluticasone (FLONASE) 50 MCG/ACT nasal spray Place 2 sprays into the nose daily.        . furosemide (LASIX) 40 MG tablet Take 40 mg by mouth daily. 20mg  in am and 20mg  in pm      . ipratropium (ATROVENT) 0.02 % nebulizer solution Take 250 mcg by nebulization 4 (four) times daily.      Marland Kitchen levothyroxine (SYNTHROID, LEVOTHROID) 75 MCG tablet Take 75 mcg by mouth every morning.      . lidocaine (LIDODERM) 5 % Place 1 patch onto the skin daily as needed (for pain). Remove & Discard patch within 12 hours or as directed by MD. Pain.      . mesalamine (PENTASA) 250 MG CR capsule Take 1,000 mg by mouth 4 (four) times daily.      . OMEGA 3 1000 MG CAPS Take 1,000 mg by mouth daily.       . potassium chloride (K-DUR) 10 MEQ tablet Take 1 tablet (10 mEq total) by mouth daily.  90 tablet  1  . PROAIR HFA 108 (90 BASE) MCG/ACT inhaler Inhale 2 puffs into the lungs every 6 (six) hours as needed for wheezing or shortness of breath.        Scheduled: . atenolol  25 mg Oral BID  . budesonide-formoterol  2 puff Inhalation BID  . enoxaparin (LOVENOX) injection  40 mg Subcutaneous Q24H  . fluticasone  2 spray Each Nare Daily  . furosemide  40 mg Oral Daily  . guaiFENesin  1,200 mg Oral  BID  . ipratropium-albuterol  3 mL Nebulization QID  . levofloxacin  500 mg Oral Daily  . levothyroxine  75 mcg Oral QAC breakfast  . lisinopril  10 mg Oral Daily  . mesalamine  1,000 mg Oral QID  . omega-3 acid ethyl esters  1,000 mg Oral Daily  . potassium chloride  20 mEq Oral BID  .  predniSONE  40 mg Oral Q breakfast  . sodium chloride  3 mL Intravenous Q12H  . sodium chloride  3 mL Intravenous Q12H   Continuous:  ELM:RAJHHI chloride, acetaminophen, acetaminophen, ALPRAZolam, calcium carbonate, heparin lock flush, ipratropium-albuterol, lidocaine, ondansetron (ZOFRAN) IV, phenol, sodium chloride, traZODone  Assesment: She was admitted with COPD exacerbation with associated nausea vomiting and diarrhea and she has improved. She has a history of lung cancer which is apparently in remission and metastatic tonsillar cancer also apparently in remission. She has a new problem which is oral candidiasis. She is overall much improved and ready for discharge Principal Problem:   COPD exacerbation Active Problems:   Carotid artery disease   Essential hypertension, benign   Nausea with vomiting   Diarrhea    Plan: Discharge home with home health services    LOS: 5 days   [name] 11/18/2013, 8:26 AM

## 2013-11-18 NOTE — Discharge Summary (Signed)
Physician Discharge Summary  Patient ID: [name] MRN: [id number] DOB/AGE: [birth year] 78 y.o. Primary Care Physician:FAGAN,ROY, MD Admit date: 11/13/2013 Discharge date: 11/18/2013    Discharge Diagnoses:   Principal Problem:   COPD exacerbation Active Problems:   Carotid artery disease   Essential hypertension, benign   Nausea with vomiting   Diarrhea   Oral pharyngeal candidiasis     Medication List         ALPRAZolam 0.25 MG tablet  Commonly known as:  XANAX  Take 0.25 mg by mouth at bedtime as needed. For sleep.     atenolol 25 MG tablet  Commonly known as:  TENORMIN  TAKE (1) TABLET BY MOUTH ONCE DAILY.     calcium carbonate 500 MG chewable tablet  Commonly known as:  TUMS - dosed in mg elemental calcium  Chew 1 tablet by mouth 2 (two) times daily as needed for heartburn.     fluticasone 50 MCG/ACT nasal spray  Commonly known as:  FLONASE  Place 2 sprays into the nose daily.     furosemide 40 MG tablet  Commonly known as:  LASIX  Take 40 mg by mouth daily. 20mg  in am and 20mg  in pm     ipratropium 0.02 % nebulizer solution  Commonly known as:  ATROVENT  Take 250 mcg by nebulization 4 (four) times daily.     levofloxacin 500 MG tablet  Commonly known as:  LEVAQUIN  Take 1 tablet (500 mg total) by mouth daily.     levothyroxine 75 MCG tablet  Commonly known as:  SYNTHROID, LEVOTHROID  Take 75 mcg by mouth every morning.     lidocaine 5 %  Commonly known as:  LIDODERM  Place 1 patch onto the skin daily as needed (for pain). Remove & Discard patch within 12 hours or as directed by MD. Pain.     mesalamine 250 MG CR capsule  Commonly known as:  PENTASA  Take 1,000 mg by mouth 4 (four) times daily.     methylPREDNIsolone 4 MG tablet  Commonly known as:  MEDROL DOSPACK  follow package directions     Omega 3 1000 MG Caps  Take 1,000 mg by mouth daily.     potassium chloride 10 MEQ tablet  Commonly known as:  K-DUR  Take 1 tablet (10 mEq  total) by mouth daily.     PROAIR HFA 108 (90 BASE) MCG/ACT inhaler  Generic drug:  albuterol  Inhale 2 puffs into the lungs every 6 (six) hours as needed for wheezing or shortness of breath.     albuterol (2.5 MG/3ML) 0.083% nebulizer solution  Commonly known as:  PROVENTIL  Take 2.5 mg by nebulization 4 (four) times daily.        Discharged Condition: Improved    Consults: None  Significant Diagnostic Studies: Dg Chest 2 View  11/13/2013   CLINICAL DATA:  Shortness of breath  EXAM: CHEST  2 VIEW  COMPARISON:  Prior CT from 10/14/2013  FINDINGS: Moderate cardiomegaly and is stable as compared to prior study. Prominent atherosclerotic calcifications noted within the aortic arch. Right-sided Port-A-Cath in place.  Lungs are normally inflated. Chronic changes again seen along the left pleural consistent with prior left thoracotomy. No pneumothorax, pulmonary edema, or definite pleural effusion. Chronic bibasilar interstitial markings are similar to prior studies.  No acute osseous abnormality. Chronic compression deformity is again noted within the thoracic spine.  IMPRESSION: Stable radiographic appearance of the chest with no acute cardiopulmonary process identified.  Electronically Signed   By: Jeannine Boga M.D.   On: 11/13/2013 04:32    Lab Results: Basic Metabolic Panel:  Recent Labs  11/16/13 0504  NA 140  K 4.6  CL 95*  CO2 36*  GLUCOSE 123*  BUN 19  CREATININE 0.61  CALCIUM 8.4   Liver Function Tests: No results found for this basename: AST, ALT, ALKPHOS, BILITOT, PROT, ALBUMIN,  in the last 72 hours   CBC: No results found for this basename: WBC, NEUTROABS, HGB, HCT, MCV, PLT,  in the last 72 hours  Recent Results (from the past 240 hour(s))  CLOSTRIDIUM DIFFICILE BY PCR     Status: None   Collection Time    11/14/13 10:15 AM      Result Value Ref Range Status   C difficile by pcr NEGATIVE  NEGATIVE Final  CULTURE, EXPECTORATED SPUTUM-ASSESSMENT      Status: None   Collection Time    11/14/13 10:15 AM      Result Value Ref Range Status   Specimen Description SPUTUM   Final   Special Requests NONE   Final   Sputum evaluation     Final   Value: THIS SPECIMEN IS ACCEPTABLE. RESPIRATORY CULTURE REPORT TO FOLLOW.   Report Status 11/14/2013 FINAL   Final  CULTURE, RESPIRATORY (NON-EXPECTORATED)     Status: None   Collection Time    11/14/13 10:15 AM      Result Value Ref Range Status   Specimen Description SPUTUM EXPECTORATED   Final   Special Requests NONE   Final   Gram Stain     Final   Value: MODERATE WBC PRESENT, PREDOMINANTLY PMN     FEW SQUAMOUS EPITHELIAL CELLS PRESENT     ABUNDANT GRAM POSITIVE COCCI IN PAIRS     IN CLUSTERS     Performed at [facility]   Culture     Final   Value: NORMAL OROPHARYNGEAL FLORA     Performed at [facility]   Report Status 11/17/2013 FINAL   Final     Hospital Course: She was admitted with COPD exacerbation. She was coughing and congested. She was short of breath. She was started on intravenous steroids, antibiotics inhaled bronchodilators and improved. She developed oral candidiasis. Her chest cleared she was much less short of breath and felt like she was back at baseline by the time of discharge  Discharge Exam: Blood pressure 143/67, pulse 76, temperature 97.5 F (36.4 C), temperature source Oral, resp. rate 18, height 5\' 4"  (1.626 m), weight 89 kg (196 lb 3.4 oz), SpO2 93.00%. She has thrush in her mouth. Her chest is much clearer than before. Her heart is regular without gallop. Her abdomen is soft  Disposition: Home with home health services. In addition to the medications on her after visit summary I called in Dukes mixture 8 ounces, 5 cc swish and swallow 4 times a day with one refill to [facility]. I could not get the computer to accept this order      Discharge Orders   Future Appointments Provider Department Dept Phone   12/22/2013 2:00 PM [facility] [place] [phone]   Future Orders Complete By Expires   Face-to-face encounter (required for Medicare/Medicaid patients)  As directed    Comments:     I Jahmad Petrich L certify that this patient is under my care and that I, or a nurse practitioner or physician's assistant working with me, had a face-to-face encounter  that meets the physician face-to-face encounter requirements with this patient on 11/18/2013. The encounter with the patient was in whole, or in part for the following medical condition(s) which is the primary reason for home health care (List medical condition): copd exacerbation   Questions:     The encounter with the patient was in whole, or in part, for the following medical condition, which is the primary reason for home health care:  copd exacerbation   I certify that, based on my findings, the following services are medically necessary home health services:  Nursing   Physical therapy   My clinical findings support the need for the above services:  Shortness of breath with activity   Further, I certify that my clinical findings support that this patient is homebound due to:  Shortness of Breath with activity   Reason for Medically Necessary Home Health Services:  Skilled Nursing- Change/Decline in Patient [place]  As directed    Questions:     To provide the following care/treatments:  RN   PT        Signed: Aylyn Wenzler L   11/18/2013, 8:34 AM

## 2013-11-18 NOTE — Progress Notes (Signed)
Patient to DC home.  Instructed on new medications and how to take them.  Stressed importance of completing whole dose of both the antibiotic and steroid pack.  Instructed to call MD if SOB worsens, running any fever, or if coughing up increased amounts of mucous.  Patient verbalizes understanding.  Patients port flushed with heparin and deaccessed.  Patient will require continuous O2 right now and patients verbalizes understanding of this.  Waiting on tank to use for transport to arrive before patient can go home.  Patient also arranged for [facility] to pick up.  No questions at this time, patient is stable to DC home.

## 2013-12-02 ENCOUNTER — Other Ambulatory Visit: Payer: Self-pay | Admitting: Cardiology

## 2013-12-02 ENCOUNTER — Other Ambulatory Visit (INDEPENDENT_AMBULATORY_CARE_PROVIDER_SITE_OTHER): Payer: Self-pay | Admitting: Internal Medicine

## 2013-12-22 ENCOUNTER — Encounter (HOSPITAL_COMMUNITY): Payer: Medicare Other

## 2014-01-01 ENCOUNTER — Other Ambulatory Visit: Payer: Self-pay | Admitting: Cardiology

## 2014-01-13 ENCOUNTER — Encounter (HOSPITAL_COMMUNITY): Payer: Medicare Other | Attending: Oncology

## 2014-01-13 DIAGNOSIS — Z452 Encounter for adjustment and management of vascular access device: Secondary | ICD-10-CM

## 2014-01-13 DIAGNOSIS — Z9889 Other specified postprocedural states: Secondary | ICD-10-CM | POA: Insufficient documentation

## 2014-01-13 DIAGNOSIS — C760 Malignant neoplasm of head, face and neck: Secondary | ICD-10-CM

## 2014-01-13 MED ORDER — HEPARIN SOD (PORK) LOCK FLUSH 100 UNIT/ML IV SOLN
500.0000 [IU] | Freq: Once | INTRAVENOUS | Status: AC
  Administered 2014-01-13: 500 [IU] via INTRAVENOUS
  Filled 2014-01-13: qty 5

## 2014-01-13 MED ORDER — SODIUM CHLORIDE 0.9 % IJ SOLN
10.0000 mL | INTRAMUSCULAR | Status: DC | PRN
  Administered 2014-01-13: 10 mL via INTRAVENOUS

## 2014-01-13 NOTE — Progress Notes (Signed)
[name] presented for Portacath access and flush. Portacath located right chest wall accessed with  H 20 needle. Good blood return present. Portacath flushed with 29ml NS and 500U/65ml Heparin and needle removed intact. Procedure without incident. Patient tolerated procedure well.

## 2014-02-01 ENCOUNTER — Other Ambulatory Visit: Payer: Self-pay | Admitting: Cardiology

## 2014-02-24 ENCOUNTER — Encounter (HOSPITAL_COMMUNITY): Payer: Medicare Other | Attending: Oncology

## 2014-02-24 ENCOUNTER — Encounter (HOSPITAL_COMMUNITY): Payer: Medicare Other

## 2014-02-24 DIAGNOSIS — Z9889 Other specified postprocedural states: Secondary | ICD-10-CM | POA: Insufficient documentation

## 2014-02-24 DIAGNOSIS — C760 Malignant neoplasm of head, face and neck: Secondary | ICD-10-CM

## 2014-02-24 DIAGNOSIS — Z452 Encounter for adjustment and management of vascular access device: Secondary | ICD-10-CM

## 2014-02-24 MED ORDER — SODIUM CHLORIDE 0.9 % IJ SOLN
10.0000 mL | INTRAMUSCULAR | Status: DC | PRN
Start: 2014-02-24 — End: 2014-02-24
  Administered 2014-02-24: 10 mL via INTRAVENOUS

## 2014-02-24 MED ORDER — HEPARIN SOD (PORK) LOCK FLUSH 100 UNIT/ML IV SOLN
500.0000 [IU] | Freq: Once | INTRAVENOUS | Status: AC
  Administered 2014-02-24: 500 [IU] via INTRAVENOUS
  Filled 2014-02-24: qty 5

## 2014-02-24 NOTE — Progress Notes (Signed)
[name] presented for Portacath access and flush. Proper placement of portacath confirmed by CXR. Portacath located rt chest wall accessed with  H 20 needle. Good blood return present. Portacath flushed with 33ml NS and 500U/59ml Heparin and needle removed intact. Procedure without incident. Patient tolerated procedure well.

## 2014-02-25 ENCOUNTER — Ambulatory Visit (INDEPENDENT_AMBULATORY_CARE_PROVIDER_SITE_OTHER): Payer: Medicare Other | Admitting: Otolaryngology

## 2014-03-11 ENCOUNTER — Other Ambulatory Visit: Payer: Self-pay | Admitting: Cardiology

## 2014-03-22 ENCOUNTER — Other Ambulatory Visit (INDEPENDENT_AMBULATORY_CARE_PROVIDER_SITE_OTHER): Payer: Self-pay | Admitting: Internal Medicine

## 2014-03-22 NOTE — Telephone Encounter (Signed)
Per Dr.Rehman patient may fill with 11 refills.

## 2014-04-07 ENCOUNTER — Encounter (HOSPITAL_COMMUNITY): Payer: Medicare Other

## 2014-04-08 ENCOUNTER — Other Ambulatory Visit (HOSPITAL_COMMUNITY): Payer: Self-pay | Admitting: Radiology

## 2014-04-08 DIAGNOSIS — C3490 Malignant neoplasm of unspecified part of unspecified bronchus or lung: Secondary | ICD-10-CM

## 2014-04-13 ENCOUNTER — Ambulatory Visit (HOSPITAL_COMMUNITY)
Admission: RE | Admit: 2014-04-13 | Discharge: 2014-04-13 | Disposition: A | Discharge status: Home / Self Care | Payer: Medicare Other | Source: Ambulatory Visit | Attending: Radiology | Admitting: Radiology

## 2014-04-13 ENCOUNTER — Encounter (HOSPITAL_COMMUNITY): Payer: Self-pay

## 2014-04-13 DIAGNOSIS — I10 Essential (primary) hypertension: Secondary | ICD-10-CM | POA: Diagnosis not present

## 2014-04-13 DIAGNOSIS — J984 Other disorders of lung: Secondary | ICD-10-CM | POA: Insufficient documentation

## 2014-04-13 DIAGNOSIS — I7090 Unspecified atherosclerosis: Secondary | ICD-10-CM | POA: Diagnosis not present

## 2014-04-13 DIAGNOSIS — C3490 Malignant neoplasm of unspecified part of unspecified bronchus or lung: Secondary | ICD-10-CM | POA: Diagnosis present

## 2014-04-13 DIAGNOSIS — J449 Chronic obstructive pulmonary disease, unspecified: Secondary | ICD-10-CM | POA: Insufficient documentation

## 2014-04-13 DIAGNOSIS — J4489 Other specified chronic obstructive pulmonary disease: Secondary | ICD-10-CM | POA: Insufficient documentation

## 2014-04-13 DIAGNOSIS — C099 Malignant neoplasm of tonsil, unspecified: Secondary | ICD-10-CM | POA: Diagnosis not present

## 2014-04-13 MED ORDER — IOHEXOL 300 MG/ML  SOLN
80.0000 mL | Freq: Once | INTRAMUSCULAR | Status: AC | PRN
  Administered 2014-04-13: 80 mL via INTRAVENOUS

## 2014-04-15 ENCOUNTER — Telehealth (INDEPENDENT_AMBULATORY_CARE_PROVIDER_SITE_OTHER): Payer: Self-pay | Admitting: *Deleted

## 2014-04-15 ENCOUNTER — Encounter (INDEPENDENT_AMBULATORY_CARE_PROVIDER_SITE_OTHER): Payer: Self-pay | Admitting: *Deleted

## 2014-04-15 ENCOUNTER — Other Ambulatory Visit (INDEPENDENT_AMBULATORY_CARE_PROVIDER_SITE_OTHER): Payer: Self-pay | Admitting: *Deleted

## 2014-04-15 DIAGNOSIS — R1310 Dysphagia, unspecified: Secondary | ICD-10-CM

## 2014-04-15 NOTE — Telephone Encounter (Signed)
  Procedure: egd/ed  Reason/Indication:  dysphagia  Has patient had this procedure before?  Yes, 2014 -- epic  If so, when, by whom and where?    Is there a family history of colon cancer?    Who?  What age when diagnosed?    Is patient diabetic?   no      Does patient have prosthetic heart valve?  no  Do you have a pacemaker?  no  Has patient ever had endocarditis? no  Has patient had joint replacement within last 12 months?  no  Does patient tend to be constipated or take laxatives? no  Is patient on Coumadin, Plavix and/or Aspirin? no  Medications: see epic  Allergies: see epic  Medication Adjustment:   Procedure date & time: 04/27/14 at 1230

## 2014-04-19 NOTE — Telephone Encounter (Signed)
agree

## 2014-04-20 ENCOUNTER — Encounter (HOSPITAL_COMMUNITY): Payer: Self-pay | Admitting: Pharmacy Technician

## 2014-04-21 ENCOUNTER — Encounter (HOSPITAL_COMMUNITY): Payer: Medicare Other | Attending: Oncology

## 2014-04-21 DIAGNOSIS — Z452 Encounter for adjustment and management of vascular access device: Secondary | ICD-10-CM

## 2014-04-21 DIAGNOSIS — Z9889 Other specified postprocedural states: Secondary | ICD-10-CM | POA: Insufficient documentation

## 2014-04-21 DIAGNOSIS — Z95828 Presence of other vascular implants and grafts: Secondary | ICD-10-CM

## 2014-04-21 DIAGNOSIS — C760 Malignant neoplasm of head, face and neck: Secondary | ICD-10-CM

## 2014-04-21 MED ORDER — HEPARIN SOD (PORK) LOCK FLUSH 100 UNIT/ML IV SOLN
500.0000 [IU] | Freq: Once | INTRAVENOUS | Status: AC
  Administered 2014-04-21: 500 [IU] via INTRAVENOUS
  Filled 2014-04-21: qty 5

## 2014-04-21 MED ORDER — SODIUM CHLORIDE 0.9 % IJ SOLN
10.0000 mL | INTRAMUSCULAR | Status: DC | PRN
  Administered 2014-04-21: 10 mL via INTRAVENOUS

## 2014-04-21 NOTE — Progress Notes (Signed)
[name] presented for Portacath access and flush. Portacath located rt chest wall accessed with  H 20 needle. Good blood return present. Portacath flushed with 24ml NS and 500U/33ml Heparin and needle removed intact. Procedure without incident. Patient tolerated procedure well. Advised to see PCP due to noted erythema of both lower legs.  Appt made for oncology followup on 8/11

## 2014-04-27 ENCOUNTER — Encounter (HOSPITAL_COMMUNITY): Payer: Self-pay | Admitting: *Deleted

## 2014-04-27 ENCOUNTER — Ambulatory Visit (HOSPITAL_COMMUNITY)
Admission: RE | Admit: 2014-04-27 | Discharge: 2014-04-27 | Disposition: A | Discharge status: Home / Self Care | Payer: Medicare Other | Source: Ambulatory Visit | Attending: Internal Medicine | Admitting: Internal Medicine

## 2014-04-27 ENCOUNTER — Encounter (HOSPITAL_COMMUNITY)
Admission: RE | Disposition: A | Discharge status: Home / Self Care | Payer: Self-pay | Source: Ambulatory Visit | Attending: Internal Medicine

## 2014-04-27 DIAGNOSIS — Z923 Personal history of irradiation: Secondary | ICD-10-CM | POA: Diagnosis not present

## 2014-04-27 DIAGNOSIS — Z8673 Personal history of transient ischemic attack (TIA), and cerebral infarction without residual deficits: Secondary | ICD-10-CM | POA: Diagnosis not present

## 2014-04-27 DIAGNOSIS — K2960 Other gastritis without bleeding: Secondary | ICD-10-CM | POA: Insufficient documentation

## 2014-04-27 DIAGNOSIS — F411 Generalized anxiety disorder: Secondary | ICD-10-CM | POA: Insufficient documentation

## 2014-04-27 DIAGNOSIS — Z85118 Personal history of other malignant neoplasm of bronchus and lung: Secondary | ICD-10-CM | POA: Diagnosis not present

## 2014-04-27 DIAGNOSIS — J449 Chronic obstructive pulmonary disease, unspecified: Secondary | ICD-10-CM | POA: Insufficient documentation

## 2014-04-27 DIAGNOSIS — Z885 Allergy status to narcotic agent status: Secondary | ICD-10-CM | POA: Insufficient documentation

## 2014-04-27 DIAGNOSIS — Z85819 Personal history of malignant neoplasm of unspecified site of lip, oral cavity, and pharynx: Secondary | ICD-10-CM | POA: Diagnosis not present

## 2014-04-27 DIAGNOSIS — K208 Other esophagitis without bleeding: Secondary | ICD-10-CM

## 2014-04-27 DIAGNOSIS — Z87891 Personal history of nicotine dependence: Secondary | ICD-10-CM | POA: Diagnosis not present

## 2014-04-27 DIAGNOSIS — K2100 Gastro-esophageal reflux disease with esophagitis, without bleeding: Secondary | ICD-10-CM | POA: Insufficient documentation

## 2014-04-27 DIAGNOSIS — M4800 Spinal stenosis, site unspecified: Secondary | ICD-10-CM | POA: Diagnosis not present

## 2014-04-27 DIAGNOSIS — J4489 Other specified chronic obstructive pulmonary disease: Secondary | ICD-10-CM | POA: Insufficient documentation

## 2014-04-27 DIAGNOSIS — Z9221 Personal history of antineoplastic chemotherapy: Secondary | ICD-10-CM | POA: Insufficient documentation

## 2014-04-27 DIAGNOSIS — R1310 Dysphagia, unspecified: Secondary | ICD-10-CM

## 2014-04-27 DIAGNOSIS — I252 Old myocardial infarction: Secondary | ICD-10-CM | POA: Insufficient documentation

## 2014-04-27 DIAGNOSIS — Z79899 Other long term (current) drug therapy: Secondary | ICD-10-CM | POA: Diagnosis not present

## 2014-04-27 DIAGNOSIS — Z880 Allergy status to penicillin: Secondary | ICD-10-CM | POA: Insufficient documentation

## 2014-04-27 DIAGNOSIS — I10 Essential (primary) hypertension: Secondary | ICD-10-CM | POA: Diagnosis not present

## 2014-04-27 DIAGNOSIS — K449 Diaphragmatic hernia without obstruction or gangrene: Secondary | ICD-10-CM | POA: Diagnosis not present

## 2014-04-27 DIAGNOSIS — K2980 Duodenitis without bleeding: Secondary | ICD-10-CM

## 2014-04-27 HISTORY — PX: ESOPHAGOGASTRODUODENOSCOPY: SHX5428

## 2014-04-27 HISTORY — PX: MALONEY DILATION: SHX5535

## 2014-04-27 HISTORY — PX: BALLOON DILATION: SHX5330

## 2014-04-27 HISTORY — PX: SAVORY DILATION: SHX5439

## 2014-04-27 SURGERY — EGD (ESOPHAGOGASTRODUODENOSCOPY)
Anesthesia: Moderate Sedation

## 2014-04-27 MED ORDER — FENTANYL CITRATE 0.05 MG/ML IJ SOLN
INTRAMUSCULAR | Status: DC | PRN
  Administered 2014-04-27: 25 ug via INTRAVENOUS

## 2014-04-27 MED ORDER — BUTAMBEN-TETRACAINE-BENZOCAINE 2-2-14 % EX AERO
INHALATION_SPRAY | CUTANEOUS | Status: DC | PRN
  Administered 2014-04-27: 2 via TOPICAL

## 2014-04-27 MED ORDER — PANTOPRAZOLE SODIUM 40 MG PO TBEC: 40.0000 mg | DELAYED_RELEASE_TABLET | Freq: Every day | ORAL | Status: AC

## 2014-04-27 MED ORDER — SIMETHICONE 40 MG/0.6ML PO SUSP
ORAL | Status: DC | PRN
  Administered 2014-04-27: 13:00:00

## 2014-04-27 MED ORDER — MIDAZOLAM HCL 5 MG/5ML IJ SOLN
INTRAMUSCULAR | Status: DC | PRN
  Administered 2014-04-27 (×2): 2 mg via INTRAVENOUS

## 2014-04-27 MED ORDER — MIDAZOLAM HCL 5 MG/5ML IJ SOLN
INTRAMUSCULAR | Status: DC
Start: 2014-04-27 — End: 2014-04-27
  Filled 2014-04-27: qty 10

## 2014-04-27 MED ORDER — MEPERIDINE HCL 50 MG/ML IJ SOLN
INTRAMUSCULAR | Status: AC
  Filled 2014-04-27: qty 1

## 2014-04-27 MED ORDER — FENTANYL CITRATE 0.05 MG/ML IJ SOLN
INTRAMUSCULAR | Status: AC
  Filled 2014-04-27: qty 2

## 2014-04-27 MED ORDER — SODIUM CHLORIDE 0.9 % IV SOLN: INTRAVENOUS | Status: DC

## 2014-04-27 NOTE — Discharge Instructions (Signed)
Resume usual medications and diet. Pantoprazole 40 mg by mouth 30 minutes before evening meals daily. No driving for 24 hours. Physician on call with result of blood test. Esophagogastroduodenoscopy Care After Refer to this sheet in the next few weeks. These instructions provide you with information on caring for yourself after your procedure. Your caregiver may also give you more specific instructions. Your treatment has been planned according to current medical practices, but problems sometimes occur. Call your caregiver if you have any problems or questions after your procedure.  HOME CARE INSTRUCTIONS  Do not eat or drink anything until the numbing medicine (local anesthetic) has worn off and your gag reflex has returned. You will know that the local anesthetic has worn off when you can swallow comfortably.  Do not drive for 12 hours after the procedure or as directed by your caregiver.  Only take medicines as directed by your caregiver. SEEK MEDICAL CARE IF:   You cannot stop coughing.  You are not urinating at all or less than usual. SEEK IMMEDIATE MEDICAL CARE IF:  You have difficulty swallowing.  You cannot eat or drink.  You have worsening throat or chest pain.  You have dizziness, lightheadedness, or you faint.  You have nausea or vomiting.  You have chills.  You have a fever.  You have severe abdominal pain.  You have black, tarry, or bloody stools.

## 2014-04-27 NOTE — Op Note (Addendum)
EGD PROCEDURE REPORT  PATIENT:  [name]  MR#:  [id number] Birthdate:  02/24/1927, 80 y.o., female Endoscopist:  Dr. Rogene Houston, MD Referred By:  Dr. [name], MD  Procedure Date: 04/27/2014  Procedure:   EGD with ED.  Indications:  Patient is a 66-year-old Caucasian female with history of GERD and Schatzki's ring who presents with solid food dysphagia. She has undergone multiple dilations in the past. Last dilation was in August 2014 but no ring was found. She responded to dilation until a few months ago. She also complains of bloating and dyspnea in the middle of night and she has to sit up in order to breathe better. He has not experienced a regurgitation or vomiting.            Informed Consent:  The risks, benefits, alternatives & imponderables which include, but are not limited to, bleeding, infection, perforation, drug reaction and potential missed lesion have been reviewed.  The potential for biopsy, lesion removal, esophageal dilation, etc. have also been discussed.  Questions have been answered.  All parties agreeable.  Please see history & physical in medical record for more information.  Medications:  Fentanyl 25 mcg IV Versed 4 mg IV Cetacaine spray topically for oropharyngeal anesthesia  Description of procedure:  The endoscope was introduced through the mouth and advanced to the second portion of the duodenum without difficulty or limitations. The mucosal surfaces were surveyed very carefully during advancement of the scope and upon withdrawal.  Findings:  Esophagus:  Mucosa of the esophagus was normal. GE junction was unremarkable without ring or stricture formation. Focal erythema noted at GE junction. GEJ:  42 cm Hiatus:  44 cm Stomach:  There was small amount of bile in the stomach but no food debris was present. Stomach distended very well with insufflation. Folds in the proximal stomach were normal. Examination of mucosa revealed a few punctate hemorrhages but  an underlying mucosa was normal. Few anterior erosions noted. Pyloric channel was patent. Angularis fundus and cardia were unremarkable. Duodenum:  Patchy edema and erythema noted to bulbar mucosa with single erosion.  Therapeutic/Diagnostic Maneuvers Performed:   Esophagus dilated by passing a 43 French balloon dilator to full insertion. Esophageal mucosa was reexamined post dilation and no mucosal disruption noted.  Complications:  None  Impression: Mild changes of reflux esophagitis limited to GE junction without ring or stricture formation. Small sliding hiatal hernia. Erosive gastroduodenitis. Esophagus dilated by passing a 56 French Maloney dilator but no mucosal disruption induced.  Comment; She may have esophageal motility disorder since no structural abnormality noted to esophagus other than mild changes of reflux esophagitis.  Recommendations:  Patient advised to go back on pantoprazole 40 mg by mouth 30 minutes before evening meal. H. pylori serology.  Janet Humphreys U  04/27/2014  1:31 PM  CC: Dr. [name], MD & Dr. Rayne Du ref. provider found

## 2014-04-27 NOTE — H&P (Addendum)
[name] is an 80 y.o. female.   Chief Complaint: Patient is here for EGD and ED. HPI: This is a 89-year-old Caucasian female her who presents with recurrent dysphagia primarily to solids. She had her esophagus dilated in August 2014 and relief lasted for 7 months.. she has difficulty with presidents. She has no difficulty with soft foods. Also complains of bloating and wakes up in the left ninth difficulty in breathing and has to sit up. She denies regurgitation nausea vomiting melena or rectal bleeding.  Past Medical History  Diagnosis Date  . Essential hypertension, benign   . Crohn's disease   . Spinal stenosis   . PSVT (paroxysmal supraventricular tachycardia)   . History of TIAs   . GERD (gastroesophageal reflux disease)   . Carotid artery disease     RICA 50-69% 12/09  . PONV (postoperative nausea and vomiting)     years ago had N/V, but not recently  . Myocardial infarction   . COPD (chronic obstructive pulmonary disease)   . Thyroid mass   . Incontinence of urine   . Anxiety   . Arthritis   . Atrial fibrillation     Postoperative - suboptimal Coumadin candidate  . CAD (coronary artery disease), native coronary artery   . Port-a-cath in place 01/22/2012  . 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 Carcinoma of tonsillar pillars (anterior) (posterior)     XRT and resection    Past Surgical History  Procedure Laterality Date  . Left thoracotomy with wedge resection  2008    Left lower lobe  . Right tonsillectomy  2005  . Left thyroidectomy  2006  . Left breast biopsy  2011  . US echocardiography    . Cholecystectomy    . Cardiovascular stress test    . Abdominal hysterectomy    . Thyroidectomy  08/30/2011    Procedure: THYROIDECTOMY;  Surgeon: Cecil Cranker;  Location: [facility];  Service: ENT;  Laterality: Right;  . Esophagogastroduodenoscopy (egd) with esophageal dilation N/A 05/06/2013    Procedure:  ESOPHAGOGASTRODUODENOSCOPY (EGD) WITH ESOPHAGEAL DILATION;  Surgeon: Rogene Houston, MD;  Location: [facility];  Service: Endoscopy;  Laterality: N/A;  71    Family History  Problem Relation Age of Onset  . Coronary artery disease Father     Died age 53  . Dementia Mother    Social History:  reports that she quit smoking about 20 years ago. Her smoking use included Cigarettes. She smoked 0.00 packs per day. She has never used smokeless tobacco. She reports that she does not drink alcohol or use illicit drugs.  Allergies:  Allergies  Allergen Reactions  . Codeine Nausea And Vomiting  . Demerol Other (See Comments)    Blood pressure  . Morphine And Related Nausea And Vomiting  . Penicillins Hives and Swelling    Medications Prior to Admission  Medication Sig Dispense Refill  . albuterol (PROVENTIL) (2.5 MG/3ML) 0.083% nebulizer solution Take 2.5 mg by nebulization 4 (four) times daily.      Marland Kitchen ALPRAZolam (XANAX) 0.25 MG tablet Take 0.25 mg by mouth at bedtime as needed. For sleep.      Marland Kitchen atenolol (TENORMIN) 25 MG tablet TAKE (1) TABLET BY MOUTH ONCE DAILY.  30 tablet  6  . fluticasone (FLONASE) 50 MCG/ACT nasal spray Place 2 sprays into the nose daily.        . furosemide (LASIX) 40 MG tablet Take  40 mg by mouth daily. 20mg  in am and 20mg  in pm      . ipratropium (ATROVENT) 0.02 % nebulizer solution Take 250 mcg by nebulization 4 (four) times daily.      Marland Kitchen levothyroxine (SYNTHROID, LEVOTHROID) 75 MCG tablet Take 75 mcg by mouth every morning.      . lidocaine (LIDODERM) 5 % Place 1 patch onto the skin daily as needed (for pain). Remove & Discard patch within 12 hours or as directed by MD. Pain.      Marland Kitchen PENTASA 250 MG CR capsule TAKE 4 CAPSULES 4 TIMES DAILY.  480 capsule  11  . potassium chloride (K-DUR) 10 MEQ tablet Take 1 tablet (10 mEq total) by mouth daily.  90 tablet  1  . PROAIR HFA 108 (90 BASE) MCG/ACT inhaler Inhale 2 puffs into the lungs every 6 (six) hours as needed for  wheezing or shortness of breath.       . calcium carbonate (TUMS - DOSED IN MG ELEMENTAL CALCIUM) 500 MG chewable tablet Chew 1 tablet by mouth 2 (two) times daily as needed for heartburn.         No results found for this or any previous visit (from the past 48 hour(s)). No results found.  ROS  Blood pressure 145/52, pulse 60, temperature 97.8 F (36.6 C), temperature source Oral, resp. rate 16, height 5' 4.5" (1.638 m), weight 200 lb (90.719 kg), SpO2 97.00%. Physical Exam  Constitutional: She appears well-developed and well-nourished.  HENT:  Mouth/Throat: Oropharynx is clear and moist.  Deep scar at right tonsillar fossa  Eyes: Conjunctivae are normal. No scleral icterus.  Neck: No thyromegaly present.  Cardiovascular: Normal rate, regular rhythm and normal heart sounds.   No murmur heard. Respiratory: Effort normal and breath sounds normal.  GI: Soft. She exhibits no distension and no mass. There is no tenderness.  Musculoskeletal: She exhibits no edema.  Lymphadenopathy:    She has no cervical adenopathy.  Neurological: She is alert.  Skin: Skin is warm and dry.     Assessment/Plan Solid food dysphagia in a patient with chronic GERD. EGD with ED.  Jennel Mara U 04/27/2014, 1:01 PM

## 2014-04-28 LAB — H. PYLORI ANTIBODY, IGG: H PYLORI IGG: 0.5 {ISR}

## 2014-04-30 ENCOUNTER — Encounter (HOSPITAL_COMMUNITY): Payer: Self-pay | Admitting: Internal Medicine

## 2014-05-11 ENCOUNTER — Ambulatory Visit (HOSPITAL_COMMUNITY): Payer: Medicare Other

## 2014-05-11 ENCOUNTER — Ambulatory Visit (HOSPITAL_COMMUNITY): Payer: Medicare Other | Admitting: Oncology

## 2014-05-11 DIAGNOSIS — C3490 Malignant neoplasm of unspecified part of unspecified bronchus or lung: Secondary | ICD-10-CM | POA: Insufficient documentation

## 2014-05-11 DIAGNOSIS — D509 Iron deficiency anemia, unspecified: Secondary | ICD-10-CM | POA: Insufficient documentation

## 2014-05-11 DIAGNOSIS — C091 Malignant neoplasm of tonsillar pillar (anterior) (posterior): Secondary | ICD-10-CM | POA: Insufficient documentation

## 2014-05-12 NOTE — Progress Notes (Signed)
This encounter was created in error - please disregard.

## 2014-05-26 ENCOUNTER — Ambulatory Visit (INDEPENDENT_AMBULATORY_CARE_PROVIDER_SITE_OTHER): Payer: Medicare Other | Admitting: Cardiology

## 2014-05-26 ENCOUNTER — Encounter: Payer: Self-pay | Admitting: Cardiology

## 2014-05-26 VITALS — BP 102/64 | HR 69 | Ht 64.0 in | Wt 203.0 lb

## 2014-05-26 DIAGNOSIS — R609 Edema, unspecified: Secondary | ICD-10-CM

## 2014-05-26 DIAGNOSIS — I739 Peripheral vascular disease, unspecified: Secondary | ICD-10-CM

## 2014-05-26 DIAGNOSIS — I779 Disorder of arteries and arterioles, unspecified: Secondary | ICD-10-CM

## 2014-05-26 DIAGNOSIS — I2510 Atherosclerotic heart disease of native coronary artery without angina pectoris: Secondary | ICD-10-CM

## 2014-05-26 DIAGNOSIS — I10 Essential (primary) hypertension: Secondary | ICD-10-CM

## 2014-05-26 DIAGNOSIS — I25810 Atherosclerosis of coronary artery bypass graft(s) without angina pectoris: Secondary | ICD-10-CM

## 2014-05-26 NOTE — Patient Instructions (Signed)
Your physician recommends that you schedule a follow-up appointment in: 6 weeks with Dr. Domenic Polite  Your physician has requested that you have a carotid duplex. This test is an ultrasound of the carotid arteries in your neck. It looks at blood flow through these arteries that supply the brain with blood. Allow one hour for this exam. There are no restrictions or special instructions.  Your physician has recommended you make the following change in your medication:   TAKE LASIX 40 MG TWICE DAILY  Thank you for choosing [facility]!!

## 2014-05-26 NOTE — Assessment & Plan Note (Signed)
No active angina symptoms. She had inferior scar without ischemia by most recent Myoview.

## 2014-05-26 NOTE — Assessment & Plan Note (Signed)
Her leg edema is most likely related to venous insufficiency with component of suspected pulmonary hypertension with COPD, and also diastolic dysfunction. I agree with the recent increase in Lasix to 40 mg twice daily, and would continue this regimen for now. She has followup lab work and visit with Dr. Willey Blade in the next few weeks. She may even need to be on a higher dose diuretic or different agent, possibly compression hose.

## 2014-05-26 NOTE — Assessment & Plan Note (Signed)
Due for followup carotid Dopplers, last assessment in August 2013 as noted above.

## 2014-05-26 NOTE — Progress Notes (Signed)
Clinical Summary [name] is a medically complex 80 y.o.female last seen in January 2014. Interval history includes EGD with findings of erosive gastroduodenitis, also hospitalization with COPD exacerbation.  She presents complaining of leg edema, just had her Lasix increased to 40 mg twice daily by Dr. Willey Blade. She has only done this for a few days so far. Continues to have limited mobility, uses a walker, denies any falls. She denies any angina or palpitations. Has chronic shortness of breath due to COPD and also cough. Her weight is up 3 pounds from July.  Recent CT scan chest reported stable chronic findings including parenchymal scarring and emphysematous changes, stable extensive atherosclerotic changes, no evidence of local cancer recurrence or metastatic disease. Carotid Dopplers from August 2013 revealed 50-69% RICA stenosis and less than 61% LICA stenosis.  Myoview from October 2012 showed LVEF 49% with inferior scar but no ischemia. Most recent echocardiogram in July 2014 showed moderate LVH with LVEF 55%, inferolateral hypokinesis, grade 1 diastolic dysfunction with borderline increased filling pressures, mild left atrial enlargement, unable to assess PASP.  Lab work from February of this year showed potassium 4.6, BUN 19, creatinine 0.6, hemoglobin 13.6, platelets 214.   Allergies  Allergen Reactions  . Codeine Nausea And Vomiting  . Demerol Other (See Comments)    Blood pressure  . Morphine And Related Nausea And Vomiting  . Penicillins Hives and Swelling    Current Outpatient Prescriptions  Medication Sig Dispense Refill  . albuterol (PROVENTIL) (2.5 MG/3ML) 0.083% nebulizer solution Take 2.5 mg by nebulization 4 (four) times daily.      Marland Kitchen ALPRAZolam (XANAX) 0.25 MG tablet Take 0.25 mg by mouth at bedtime as needed. For sleep.      Marland Kitchen atenolol (TENORMIN) 25 MG tablet TAKE (1) TABLET BY MOUTH ONCE DAILY.  30 tablet  6  . calcium carbonate (TUMS - DOSED IN MG ELEMENTAL  CALCIUM) 500 MG chewable tablet Chew 1 tablet by mouth 2 (two) times daily as needed for heartburn.       . fluticasone (FLONASE) 50 MCG/ACT nasal spray Place 2 sprays into the nose daily.        . furosemide (LASIX) 40 MG tablet Take 40 mg by mouth 2 (two) times daily. 20mg  in am and 20mg  in pm      . ipratropium (ATROVENT) 0.02 % nebulizer solution Take 250 mcg by nebulization 4 (four) times daily.      Marland Kitchen levothyroxine (SYNTHROID, LEVOTHROID) 75 MCG tablet Take 75 mcg by mouth every morning.      . lidocaine (LIDODERM) 5 % Place 1 patch onto the skin daily as needed (for pain). Remove & Discard patch within 12 hours or as directed by MD. Pain.      . pantoprazole (PROTONIX) 40 MG tablet Take 1 tablet (40 mg total) by mouth daily.  30 tablet  5  . PENTASA 250 MG CR capsule TAKE 4 CAPSULES 4 TIMES DAILY.  480 capsule  11  . potassium chloride (K-DUR) 10 MEQ tablet Take 1 tablet (10 mEq total) by mouth daily.  90 tablet  1  . PROAIR HFA 108 (90 BASE) MCG/ACT inhaler Inhale 2 puffs into the lungs every 6 (six) hours as needed for wheezing or shortness of breath.        No current facility-administered medications for this visit.    Past Medical History  Diagnosis Date  . Essential hypertension, benign   . Crohn's disease   . Spinal stenosis   .  PSVT (paroxysmal supraventricular tachycardia)   . History of TIAs   . GERD (gastroesophageal reflux disease)   . Carotid artery disease     RICA 50-69% 12/09  . PONV (postoperative nausea and vomiting)     years ago had N/V, but not recently  . Myocardial infarction   . COPD (chronic obstructive pulmonary disease)   . Thyroid mass   . Incontinence of urine   . Anxiety   . Arthritis   . Atrial fibrillation     Postoperative - suboptimal Coumadin candidate  . CAD (coronary artery disease), native coronary artery   . Port-a-cath in place 01/22/2012  . 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  Carcinoma of tonsillar pillars (anterior) (posterior)     XRT and resection    Past Surgical History  Procedure Laterality Date  . Left thoracotomy with wedge resection  2008    Left lower lobe  . Right tonsillectomy  2005  . Left thyroidectomy  2006  . Left breast biopsy  2011  . US echocardiography    . Cholecystectomy    . Cardiovascular stress test    . Abdominal hysterectomy    . Thyroidectomy  08/30/2011    Procedure: THYROIDECTOMY;  Surgeon: Cecil Cranker;  Location: [facility] OR;  Service: ENT;  Laterality: Right;  . Esophagogastroduodenoscopy (egd) with esophageal dilation N/A 05/06/2013    Procedure: ESOPHAGOGASTRODUODENOSCOPY (EGD) WITH ESOPHAGEAL DILATION;  Surgeon: Rogene Houston, MD;  Location: [facility];  Service: Endoscopy;  Laterality: N/A;  230  . Esophagogastroduodenoscopy N/A 04/27/2014    Procedure: ESOPHAGOGASTRODUODENOSCOPY (EGD);  Surgeon: Rogene Houston, MD;  Location: [facility];  Service: Endoscopy;  Laterality: N/A;  1230  . Balloon dilation N/A 04/27/2014    Procedure: BALLOON DILATION;  Surgeon: Rogene Houston, MD;  Location: [facility];  Service: Endoscopy;  Laterality: N/A;  Venia Minks dilation N/A 04/27/2014    Procedure: Venia Minks DILATION;  Surgeon: Rogene Houston, MD;  Location: [facility];  Service: Endoscopy;  Laterality: N/A;  . Savory dilation N/A 04/27/2014    Procedure: SAVORY DILATION;  Surgeon: Rogene Houston, MD;  Location: [facility];  Service: Endoscopy;  Laterality: N/A;    Social History [name] reports that she quit smoking about 20 years ago. Her smoking use included Cigarettes. She smoked 0.00 packs per day. She has never used smokeless tobacco. [name] reports that she does not drink alcohol.  Review of Systems No fevers or chills. Intermittent brief headaches. No focal motor weakness or speech deficits. Stable appetite. No bleeding episodes. Other systems reviewed and negative except as outlined.  Physical  Examination Filed Vitals:   05/26/14 0815  BP: 102/64  Pulse: 69   Filed Weights   05/26/14 0815  Weight: 203 lb (92.08 kg)    Overweight woman in no acute distress. Using walker. HEENT: Conjuctivae and lids normal, oropharynx clear.  Neck: Supple, no JVD or obvious bruit.  Lungs: Clear, decreased breath sounds at bases.  Cardiac: Regular rate and rhythm, distant, soft systolic murmur, no S3.  Abdomen: Soft, NABS.  Skin: Warm and dry.  Extremities: Venous stasis and spider veins, right worse than left, 1-2+ edema. Musculoskeletal: No kyphosis. Neuropsychiatric: Alert and oriented x3, hard of hearing, affect appropriate.   Problem List and Plan   Edema Her leg edema is most likely related to venous insufficiency with component of suspected pulmonary hypertension with COPD,  and also diastolic dysfunction. I agree with the recent increase in Lasix to 40 mg twice daily, and would continue this regimen for now. She has followup lab work and visit with Dr. Willey Blade in the next few weeks. She may even need to be on a higher dose diuretic or different agent, possibly compression hose.  Carotid artery disease Due for followup carotid Dopplers, last assessment in August 2013 as noted above.  Essential hypertension, benign Blood pressure is well controlled today.  Coronary atherosclerosis of native coronary artery No active angina symptoms. She had inferior scar without ischemia by most recent Myoview.      Satira Sark, M.D., F.A.C.C.

## 2014-05-26 NOTE — Assessment & Plan Note (Signed)
Blood pressure is well-controlled today. 

## 2014-06-02 ENCOUNTER — Ambulatory Visit (HOSPITAL_COMMUNITY)
Admission: RE | Admit: 2014-06-02 | Discharge: 2014-06-02 | Disposition: A | Discharge status: Home / Self Care | Payer: Medicare Other | Source: Ambulatory Visit | Attending: Cardiology | Admitting: Cardiology

## 2014-06-02 ENCOUNTER — Encounter (HOSPITAL_COMMUNITY)
Admission: RE | Admit: 2014-06-02 | Discharge: 2014-06-02 | Disposition: A | Discharge status: Home / Self Care | Payer: Medicare Other | Source: Ambulatory Visit | Attending: Internal Medicine | Admitting: Internal Medicine

## 2014-06-02 ENCOUNTER — Encounter (HOSPITAL_COMMUNITY): Payer: Medicare Other

## 2014-06-02 DIAGNOSIS — I6529 Occlusion and stenosis of unspecified carotid artery: Secondary | ICD-10-CM | POA: Insufficient documentation

## 2014-06-02 DIAGNOSIS — I25810 Atherosclerosis of coronary artery bypass graft(s) without angina pectoris: Secondary | ICD-10-CM

## 2014-06-02 DIAGNOSIS — C091 Malignant neoplasm of tonsillar pillar (anterior) (posterior): Secondary | ICD-10-CM | POA: Insufficient documentation

## 2014-06-02 DIAGNOSIS — Z452 Encounter for adjustment and management of vascular access device: Secondary | ICD-10-CM | POA: Insufficient documentation

## 2014-06-02 MED ORDER — SODIUM CHLORIDE 0.9 % IJ SOLN
10.0000 mL | INTRAMUSCULAR | Status: AC | PRN
  Administered 2014-06-02: 10 mL

## 2014-06-02 MED ORDER — HEPARIN SOD (PORK) LOCK FLUSH 100 UNIT/ML IV SOLN
INTRAVENOUS | Status: AC
  Filled 2014-06-02: qty 5

## 2014-06-02 MED ORDER — HEPARIN SOD (PORK) LOCK FLUSH 100 UNIT/ML IV SOLN
500.0000 [IU] | INTRAVENOUS | Status: AC | PRN
  Administered 2014-06-02: 500 [IU]

## 2014-06-02 NOTE — Progress Notes (Signed)
Right chest porta cath flush done using sterile tech. Good blood return. Flushed with saline then heparin as ordered. Tolerated well. D/C to home in good condition.

## 2014-06-02 NOTE — Progress Notes (Signed)
Here for port-a cath flush. Dx tonsillar cancer.

## 2014-06-03 ENCOUNTER — Telehealth: Payer: Self-pay | Admitting: *Deleted

## 2014-06-03 NOTE — Telephone Encounter (Signed)
Notified pt of results, forwarded to Dr. Willey Blade

## 2014-06-03 NOTE — Telephone Encounter (Signed)
Message copied by Desma Mcgregor on Thu Jun 03, 2014 10:10 AM ------      Message from: MCDOWELL, Aloha Gell      Created: Wed Jun 02, 2014  3:57 PM       Reviewed report. There is 50-69% bilateral ICA stenoses, this would represent some progression on the left, however still not in range to require surgery. Continue medical therapy. ------

## 2014-06-14 ENCOUNTER — Other Ambulatory Visit (HOSPITAL_COMMUNITY): Payer: Self-pay | Admitting: Internal Medicine

## 2014-06-14 DIAGNOSIS — D320 Benign neoplasm of cerebral meninges: Secondary | ICD-10-CM

## 2014-06-22 ENCOUNTER — Ambulatory Visit (HOSPITAL_COMMUNITY)
Admission: RE | Admit: 2014-06-22 | Discharge: 2014-06-22 | Disposition: A | Discharge status: Home / Self Care | Payer: Medicare Other | Source: Ambulatory Visit | Attending: Internal Medicine | Admitting: Internal Medicine

## 2014-06-22 ENCOUNTER — Ambulatory Visit (HOSPITAL_COMMUNITY): Payer: Medicare Other

## 2014-06-22 DIAGNOSIS — R269 Unspecified abnormalities of gait and mobility: Secondary | ICD-10-CM | POA: Insufficient documentation

## 2014-06-22 DIAGNOSIS — D320 Benign neoplasm of cerebral meninges: Secondary | ICD-10-CM

## 2014-06-22 DIAGNOSIS — R51 Headache: Secondary | ICD-10-CM | POA: Diagnosis present

## 2014-06-28 ENCOUNTER — Telehealth: Payer: Self-pay | Admitting: *Deleted

## 2014-06-28 ENCOUNTER — Encounter: Payer: Self-pay | Admitting: Cardiology

## 2014-06-28 NOTE — Telephone Encounter (Signed)
Received labs in Dr. Domenic Polite folder.

## 2014-07-07 ENCOUNTER — Encounter: Payer: Self-pay | Admitting: Cardiology

## 2014-07-07 ENCOUNTER — Ambulatory Visit (INDEPENDENT_AMBULATORY_CARE_PROVIDER_SITE_OTHER): Payer: Medicare Other | Admitting: Cardiology

## 2014-07-07 VITALS — BP 116/66 | HR 88 | Ht 65.0 in | Wt 202.0 lb

## 2014-07-07 DIAGNOSIS — I739 Peripheral vascular disease, unspecified: Secondary | ICD-10-CM

## 2014-07-07 DIAGNOSIS — R600 Localized edema: Secondary | ICD-10-CM

## 2014-07-07 DIAGNOSIS — I25810 Atherosclerosis of coronary artery bypass graft(s) without angina pectoris: Secondary | ICD-10-CM

## 2014-07-07 DIAGNOSIS — I2510 Atherosclerotic heart disease of native coronary artery without angina pectoris: Secondary | ICD-10-CM

## 2014-07-07 DIAGNOSIS — I779 Disorder of arteries and arterioles, unspecified: Secondary | ICD-10-CM

## 2014-07-07 DIAGNOSIS — I10 Essential (primary) hypertension: Secondary | ICD-10-CM

## 2014-07-07 NOTE — Assessment & Plan Note (Signed)
Moderate bilateral ICA disease.

## 2014-07-07 NOTE — Assessment & Plan Note (Signed)
No active angina symptoms. Continue observation.

## 2014-07-07 NOTE — Assessment & Plan Note (Signed)
Improved on diuretics. Potassium and renal function stable. Her leg pain seems more musculoskeletal, doubt thrombosis at this time.

## 2014-07-07 NOTE — Progress Notes (Signed)
Clinical Summary [name] is an 80 y.o.female last seen in August. She presents for a routine visit. States that her leg edema is better on diuretics. She has been having pain in her left lower posterior leg, also a small area anteriorly. No cords.  Lab work in September with Dr. Willey Blade showed potassium 4.1, BUN 14, creatinine 0.7. Weight is down a pound from last visit.  Carotid Dopplers in September showed 50-69% bilateral ICA stenoses. We discussed the results today.  Myoview from October 2012 showed LVEF 49% with inferior scar but no ischemia. Most recent echocardiogram in July 2014 showed moderate LVH with LVEF 55%, inferolateral hypokinesis, grade 1 diastolic dysfunction with borderline increased filling pressures, mild left atrial enlargement, unable to assess PASP.   Allergies  Allergen Reactions  . Codeine Nausea And Vomiting  . Demerol Other (See Comments)    Blood pressure  . Morphine And Related Nausea And Vomiting  . Penicillins Hives and Swelling    Current Outpatient Prescriptions  Medication Sig Dispense Refill  . albuterol (PROVENTIL) (2.5 MG/3ML) 0.083% nebulizer solution Take 2.5 mg by nebulization 4 (four) times daily.      Marland Kitchen ALPRAZolam (XANAX) 0.25 MG tablet Take 0.25 mg by mouth at bedtime as needed. For sleep.      Marland Kitchen atenolol (TENORMIN) 25 MG tablet TAKE (1) TABLET BY MOUTH ONCE DAILY.  30 tablet  6  . calcium carbonate (TUMS - DOSED IN MG ELEMENTAL CALCIUM) 500 MG chewable tablet Chew 1 tablet by mouth 2 (two) times daily as needed for heartburn.       . fluticasone (FLONASE) 50 MCG/ACT nasal spray Place 2 sprays into the nose daily.        . furosemide (LASIX) 40 MG tablet Take 40 mg by mouth 2 (two) times daily. 20mg  in am and 20mg  in pm      . ipratropium (ATROVENT) 0.02 % nebulizer solution Take 250 mcg by nebulization 4 (four) times daily.      Marland Kitchen levothyroxine (SYNTHROID, LEVOTHROID) 75 MCG tablet Take 75 mcg by mouth every morning.      . lidocaine  (LIDODERM) 5 % Place 1 patch onto the skin daily as needed (for pain). Remove & Discard patch within 12 hours or as directed by MD. Pain.      . pantoprazole (PROTONIX) 40 MG tablet Take 1 tablet (40 mg total) by mouth daily.  30 tablet  5  . PENTASA 250 MG CR capsule TAKE 4 CAPSULES 4 TIMES DAILY.  480 capsule  11  . potassium chloride (K-DUR) 10 MEQ tablet Take 1 tablet (10 mEq total) by mouth daily.  90 tablet  1  . PROAIR HFA 108 (90 BASE) MCG/ACT inhaler Inhale 2 puffs into the lungs every 6 (six) hours as needed for wheezing or shortness of breath.        No current facility-administered medications for this visit.    Past Medical History  Diagnosis Date  . Essential hypertension, benign   . Crohn's disease   . Spinal stenosis   . PSVT (paroxysmal supraventricular tachycardia)   . History of TIAs   . GERD (gastroesophageal reflux disease)   . Carotid artery disease     RICA 50-69% 12/09  . Myocardial infarction   . COPD (chronic obstructive pulmonary disease)   . Thyroid mass   . Incontinence of urine   . Anxiety   . Arthritis   . Atrial fibrillation     Postoperative - suboptimal Coumadin  candidate  . CAD (coronary artery disease), native coronary artery   . Port-a-cath in place 01/22/2012  . 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 Her smoking use included Cigarettes. She smoked 0.00 packs per day. She has never used smokeless tobacco. [name] reports that she does not drink alcohol.  Review of Systems Part of hearing. Each. Other systems reviewed and negative.  Physical Examination Filed Vitals:   07/07/14 1031  BP: 116/66  Pulse: 88   Filed Weights   07/07/14 1031  Weight: 202 lb (91.627 kg)    Overweight woman in no acute distress. Using walker.  HEENT:  Conjuctivae and lids normal, oropharynx clear.  Neck: Supple, no JVD or obvious bruit.  Lungs: Clear, decreased breath sounds at bases.  Cardiac: Regular rate and rhythm, distant, soft systolic murmur, no S3.  Abdomen: Soft, NABS.  Skin: Warm and dry.  Extremities: Venous stasis and spider veins, right worse than left, 1-2+ edema.  Musculoskeletal: No kyphosis.  Neuropsychiatric: Alert and oriented x3, hard of hearing, affect appropriate.   Problem List and Plan   Coronary atherosclerosis of native coronary artery No active angina symptoms. Continue observation.  Essential hypertension, benign Good blood pressure control today. No changes made.  Bilateral leg edema Improved on diuretics. Potassium and renal function stable. Her leg pain seems more musculoskeletal, doubt thrombosis at this time.  Carotid artery disease Moderate bilateral ICA disease.    Satira Sark, M.D., F.A.C.C.

## 2014-07-07 NOTE — Assessment & Plan Note (Signed)
Good blood pressure control today. No changes made.

## 2014-07-07 NOTE — Patient Instructions (Signed)
Your physician wants you to follow-up in: 4 months with Dr. Ferne Reus will receive a reminder letter in the mail two months in advance. If you don't receive a letter, please call our office to schedule the follow-up appointment.  Your physician recommends that you continue on your current medications as directed. Please refer to the Current Medication list given to you today.  Thank you for choosing [facility]!!

## 2014-07-14 ENCOUNTER — Encounter (HOSPITAL_COMMUNITY)
Admission: RE | Admit: 2014-07-14 | Discharge: 2014-07-14 | Disposition: A | Discharge status: Home / Self Care | Payer: Medicare Other | Source: Ambulatory Visit | Attending: Internal Medicine | Admitting: Internal Medicine

## 2014-07-14 DIAGNOSIS — C091 Malignant neoplasm of tonsillar pillar (anterior) (posterior): Secondary | ICD-10-CM | POA: Insufficient documentation

## 2014-07-14 MED ORDER — HEPARIN SOD (PORK) LOCK FLUSH 100 UNIT/ML IV SOLN
500.0000 [IU] | Freq: Once | INTRAVENOUS | Status: AC
  Administered 2014-07-14: 500 [IU]

## 2014-07-14 MED ORDER — SODIUM CHLORIDE 0.9 % IJ SOLN
10.0000 mL | INTRAMUSCULAR | Status: AC | PRN
  Administered 2014-07-14: 10 mL

## 2014-07-14 MED ORDER — HEPARIN SOD (PORK) LOCK FLUSH 100 UNIT/ML IV SOLN
INTRAVENOUS | Status: AC
  Filled 2014-07-14: qty 5

## 2014-09-05 NOTE — Progress Notes (Signed)
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 - Plan: CBC with Differential, Comprehensive metabolic panel, Schedule Portacath Flush Appointment, heparin lock flush 100 unit/mL, sodium chloride 0.9 % injection 10 mL, CBC with Differential, Comprehensive metabolic panel  Cancer of tonsillar pillars (anterior) (posterior) - Plan: CBC with Differential, Comprehensive metabolic panel, TSH, Schedule Portacath Flush Appointment, heparin lock flush 100 unit/mL, sodium chloride 0.9 % injection 10 mL, CBC with Differential, Comprehensive metabolic panel, TSH  Anemia, iron deficiency - Plan: Ferritin, Schedule Portacath Flush Appointment, heparin lock flush 100 unit/mL, sodium chloride 0.9 % injection 10 mL, Ferritin  Chronic constipation  Right ear pain  Sore in nostril  Bilateral edema of lower extremity  CURRENT THERAPY: Surveillance per NCCN guidelines  INTERVAL HISTORY: [name] 80 y.o. 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     Cancer of tonsillar pillars (anterior) (posterior)    Definitive Surgery 2005 resection of primary tonsillar cancer    Definitive Surgery 2006 wedge resection of left upper lobe x 2 by Dr. Arlyce Dice   05/09/2005 - 08/29/2005 Chemotherapy Carboplatin/Taxol/Erbitux x 6 cycles    Definitive Surgery 06/2007 wedge resection   03/12/2008 - 03/22/2008 Radiation Therapy SBRT 45 Gy x 5 fractions to LLL lesion and 32.5 Gy x 5 fractions to additional LLL   09/20/2008 - 09/20/2008 Radiation Therapy SBRT 26 Gy in 1 fraction to left lung fissue lesion   01/17/2010 - 01/17/2010 Radiation Therapy SBRT 22 Gy in 1 fraction of RLL lesion   I personally reviewed and went over laboratory results with the patient.  The results are noted within this dictation.  I personally reviewed and went over radiographic studies  with the patient.  The results are noted within this dictation.  "If you were Dr. Joie Bimler, I would believe you."    She brought up a host of complaints that are not oncologically related: 1. Chronic constipation 2. Right ear pain- negative exam 3. Right nare abnormality- "I feel like there is something in there."  negative exam. 4. LE edema- chronic issue  I deferred these to primary care provider due to negative exam.  Oncologically, she denies any complaints and ROS questioning negative.   Past Medical History  Diagnosis Date  . Essential hypertension, benign   . Crohn's disease   . Spinal stenosis   . PSVT (paroxysmal supraventricular tachycardia)   . History of TIAs   . GERD (gastroesophageal reflux disease)   . Carotid artery disease     RICA 50-69% 12/09  . Myocardial infarction   . COPD (chronic obstructive pulmonary disease)   . Thyroid mass   . Incontinence of urine   . Anxiety   . Arthritis   . Atrial fibrillation     Postoperative - suboptimal Coumadin candidate  . CAD (coronary artery disease), native coronary artery   . Port-a-cath in place 01/22/2012  . 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 Carcinoma of tonsillar pillars (anterior) (posterior)     XRT and resection    has Carotid artery disease; Essential hypertension, benign; Coronary atherosclerosis of native coronary artery; GERD (gastroesophageal reflux disease); COPD (chronic obstructive pulmonary disease); Mixed hyperlipidemia; Port-a-cath in place; Dysphagia; Bilateral leg edema; COPD exacerbation; Nausea with vomiting; Diarrhea; Oral pharyngeal candidiasis; Lung cancer; Cancer of tonsillar  pillars (anterior) (posterior); and Anemia, iron deficiency on her problem list.     is allergic to codeine; demerol; morphine and related; and penicillins.  [name] does not currently have medications on file.  Past Surgical History  Procedure Laterality Date  .  Left thoracotomy with wedge resection  2008    Left lower lobe  . Right tonsillectomy  2005  . Left thyroidectomy  2006  . Left breast biopsy  2011  . US echocardiography    . Cholecystectomy    . Cardiovascular stress test    . Abdominal hysterectomy    . Thyroidectomy  08/30/2011    Procedure: THYROIDECTOMY;  Surgeon: Cecil Cranker;  Location: [facility] OR;  Service: ENT;  Laterality: Right;  . Esophagogastroduodenoscopy (egd) with esophageal dilation N/A 05/06/2013    Procedure: ESOPHAGOGASTRODUODENOSCOPY (EGD) WITH ESOPHAGEAL DILATION;  Surgeon: Rogene Houston, MD;  Location: [facility];  Service: Endoscopy;  Laterality: N/A;  230  . Esophagogastroduodenoscopy N/A 04/27/2014    Procedure: ESOPHAGOGASTRODUODENOSCOPY (EGD);  Surgeon: Rogene Houston, MD;  Location: [facility];  Service: Endoscopy;  Laterality: N/A;  1230  . Balloon dilation N/A 04/27/2014    Procedure: BALLOON DILATION;  Surgeon: Rogene Houston, MD;  Location: [facility];  Service: Endoscopy;  Laterality: N/A;  Venia Minks dilation N/A 04/27/2014    Procedure: Venia Minks DILATION;  Surgeon: Rogene Houston, MD;  Location: [facility];  Service: Endoscopy;  Laterality: N/A;  . Savory dilation N/A 04/27/2014    Procedure: SAVORY DILATION;  Surgeon: Rogene Houston, MD;  Location: [facility];  Service: Endoscopy;  Laterality: N/A;    Denies any headaches, dizziness, double vision, fevers, chills, night sweats, nausea, vomiting, diarrhea, constipation, chest pain, heart palpitations, shortness of breath, blood in stool, black tarry stool, urinary pain, urinary burning, urinary frequency, hematuria.   PHYSICAL EXAMINATION  ECOG PERFORMANCE STATUS: 3 - Symptomatic, >50% confined to bed  Filed Vitals:   09/08/14 1336  BP: 113/49  Pulse: 62  Temp: 97.7 F (36.5 C)  Resp: 18    GENERAL:alert, no distress, well nourished, well developed, comfortable, cooperative and smiling SKIN: skin color, texture, turgor are normal, no  rashes or significant lesions HEAD: Normocephalic, No masses, lesions, tenderness or abnormalities EYES: normal, PERRLA, EOMI, Conjunctiva are pink and non-injected EARS: External ears normal OROPHARYNX:lips, buccal mucosa, and tongue normal  NECK: supple, no adenopathy, no stridor, non-tender, trachea midline LYMPH:  no palpable lymphadenopathy BREAST:not examined LUNGS: clear to auscultation, diminished breaths sounds HEART: regular rate & rhythm, no murmurs and no gallops ABDOMEN:abdomen soft, non-tender, obese and normal bowel sounds BACK: Back symmetric, no curvature. EXTREMITIES:less then 2 second capillary refill, no skin discoloration, no cyanosis, positive findings:  edema B/L 2+ pitting edema  NEURO: alert & oriented x 3 with fluent speech, no focal motor/sensory deficits, in wheelchair   LABORATORY DATA: CBC    Component Value Date/Time   WBC 5.4 11/14/2013 0631   RBC 4.45 11/14/2013 0631   HGB 13.6 11/14/2013 0631   HCT 42.3 11/14/2013 0631   PLT 214 11/14/2013 0631   MCV 95.1 11/14/2013 0631   MCH 30.6 11/14/2013 0631   MCHC 32.2 11/14/2013 0631   RDW 14.8 11/14/2013 0631   LYMPHSABS 0.6* 11/13/2013 0343   MONOABS 0.5 11/13/2013 0343   EOSABS 0.0 11/13/2013 0343   BASOSABS 0.0 11/13/2013 0343      Chemistry      Component Value Date/Time   NA 140 11/16/2013 0504  K 4.6 11/16/2013 0504   CL 95* 11/16/2013 0504   CO2 36* 11/16/2013 0504   BUN 19 11/16/2013 0504   CREATININE 0.61 11/16/2013 0504   CREATININE 0.60 10/20/2012 1435      Component Value Date/Time   CALCIUM 8.4 11/16/2013 0504   ALKPHOS 86 11/13/2013 0343   AST 15 11/13/2013 0343   ALT 8 11/13/2013 0343   BILITOT 0.4 11/13/2013 0343        ASSESSMENT:  1. 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  2. H/O of tonsillar cancer S/P resection and adjuvant radiation therapy in 2005 by Dr. Nila Nephew. 3. Chronic constipation 4.  Intermittent right ear pain, benign exam 5. Right nare feeling of abnormality 6. B/L LE edema, chronic  Patient Active Problem List   Diagnosis Date Noted  . Lung cancer 05/11/2014  . Cancer of tonsillar pillars (anterior) (posterior) 05/11/2014  . Anemia, iron deficiency 05/11/2014  . Oral pharyngeal candidiasis 11/18/2013  . COPD exacerbation 11/13/2013  . Nausea with vomiting 11/13/2013  . Diarrhea 11/13/2013  . Bilateral leg edema 05/07/2012  . Dysphagia 04/29/2012  . Port-a-cath in place 01/22/2012  . Mixed hyperlipidemia 09/07/2011  . GERD (gastroesophageal reflux disease) 09/03/2011  . COPD (chronic obstructive pulmonary disease) 09/03/2011  . Coronary atherosclerosis of native coronary artery 07/13/2011  . Carotid artery disease 06/21/2011  . Essential hypertension, benign 06/21/2011     PLAN:  1. I personally reviewed and went over laboratory results with the patient.  The results are noted within this dictation. 2. I personally reviewed and went over radiographic studies with the patient.  The results are noted within this dictation.   3. Labs today as ordered: CBC diff, CMET, Ferritin, TSH 5. Labs in 6 months: CBC diff, CMET, ferritin, TSH 6. Return in 6 months for follow-up.  If not imaging scans are performed, will need to consider ordering at that time.     THERAPY PLAN:  NCCN guidelines for surveillance of Head and Neck cancer recommends:  A. H+P every 1-3 months for year 1  B. H+P every 2-6 months for year 2  C. H+P every 4-8 months for years 3-5  D. H+P every year for years greater than 5  E. Imaging only as clinically indicated following baseline imaging study within 6 months of completion of therapy.   F.Complete head and neck exam; mirror and fiberoptic examination as clinically indicated.  G. TSH every 6-12 months.  H. Chest imaging as clinically indicated for patients with smoking history  I. Speech/hearing and swallowing evaluation and rehabilitation  as clinically indicated.  J. Smoking cessation and EtOH counseling as clinically indicated.  K. Dental evaluation: recommended for oral cavity and sites exposed to significant intraoral radiation treatment.  L. Consider EBV DNA monitoring for nasopharyngeal cancer.    All questions were answered. The patient knows to call the clinic with any problems, questions or concerns. We can certainly see the patient much sooner if necessary.  Patient and plan discussed with Dr. Farrel Gobble and he is in agreement with the aforementioned.   Lashaun Poch 09/08/2014

## 2014-09-08 ENCOUNTER — Encounter (HOSPITAL_COMMUNITY): Payer: Self-pay | Admitting: Oncology

## 2014-09-08 ENCOUNTER — Encounter (HOSPITAL_COMMUNITY): Payer: Medicare Other | Attending: Internal Medicine | Admitting: Oncology

## 2014-09-08 ENCOUNTER — Encounter (HOSPITAL_COMMUNITY): Payer: Medicare Other

## 2014-09-08 VITALS — BP 113/49 | HR 62 | Temp 97.7°F | Resp 18 | Wt 203.8 lb

## 2014-09-08 DIAGNOSIS — E890 Postprocedural hypothyroidism: Secondary | ICD-10-CM | POA: Insufficient documentation

## 2014-09-08 DIAGNOSIS — C760 Malignant neoplasm of head, face and neck: Secondary | ICD-10-CM

## 2014-09-08 DIAGNOSIS — E611 Iron deficiency: Secondary | ICD-10-CM | POA: Diagnosis not present

## 2014-09-08 DIAGNOSIS — C091 Malignant neoplasm of tonsillar pillar (anterior) (posterior): Secondary | ICD-10-CM | POA: Diagnosis present

## 2014-09-08 DIAGNOSIS — K5909 Other constipation: Secondary | ICD-10-CM

## 2014-09-08 DIAGNOSIS — K5900 Constipation, unspecified: Secondary | ICD-10-CM

## 2014-09-08 DIAGNOSIS — D509 Iron deficiency anemia, unspecified: Secondary | ICD-10-CM | POA: Diagnosis present

## 2014-09-08 DIAGNOSIS — C3401 Malignant neoplasm of right main bronchus: Secondary | ICD-10-CM

## 2014-09-08 DIAGNOSIS — C7800 Secondary malignant neoplasm of unspecified lung: Secondary | ICD-10-CM

## 2014-09-08 DIAGNOSIS — J3489 Other specified disorders of nose and nasal sinuses: Secondary | ICD-10-CM

## 2014-09-08 DIAGNOSIS — H9201 Otalgia, right ear: Secondary | ICD-10-CM

## 2014-09-08 DIAGNOSIS — R600 Localized edema: Secondary | ICD-10-CM

## 2014-09-08 LAB — CBC WITH DIFFERENTIAL/PLATELET
Basophils Absolute: 0 10*3/uL (ref 0.0–0.1)
Basophils Relative: 0 % (ref 0–1)
Eosinophils Absolute: 0.2 10*3/uL (ref 0.0–0.7)
Eosinophils Relative: 3 % (ref 0–5)
HCT: 31.2 % — ABNORMAL LOW (ref 36.0–46.0)
Hemoglobin: 9.7 g/dL — ABNORMAL LOW (ref 12.0–15.0)
Lymphocytes Relative: 17 % (ref 12–46)
Lymphs Abs: 1.1 10*3/uL (ref 0.7–4.0)
MCH: 26.2 pg (ref 26.0–34.0)
MCHC: 31.1 g/dL (ref 30.0–36.0)
MCV: 84.3 fL (ref 78.0–100.0)
Monocytes Absolute: 0.7 10*3/uL (ref 0.1–1.0)
Monocytes Relative: 10 % (ref 3–12)
Neutro Abs: 4.7 10*3/uL (ref 1.7–7.7)
Neutrophils Relative %: 70 % (ref 43–77)
Platelets: 342 10*3/uL (ref 150–400)
RBC: 3.7 MIL/uL — ABNORMAL LOW (ref 3.87–5.11)
RDW: 16.4 % — ABNORMAL HIGH (ref 11.5–15.5)
WBC: 6.7 10*3/uL (ref 4.0–10.5)

## 2014-09-08 LAB — COMPREHENSIVE METABOLIC PANEL
ALT: 7 U/L (ref 0–35)
AST: 14 U/L (ref 0–37)
Albumin: 3.1 g/dL — ABNORMAL LOW (ref 3.5–5.2)
Alkaline Phosphatase: 87 U/L (ref 39–117)
Anion gap: 12 (ref 5–15)
BUN: 17 mg/dL (ref 6–23)
CO2: 32 mEq/L (ref 19–32)
Calcium: 8.9 mg/dL (ref 8.4–10.5)
Chloride: 95 mEq/L — ABNORMAL LOW (ref 96–112)
Creatinine, Ser: 0.9 mg/dL (ref 0.50–1.10)
GFR calc Af Amer: 66 mL/min — ABNORMAL LOW (ref >90)
GFR calc non Af Amer: 57 mL/min — ABNORMAL LOW (ref >90)
Glucose, Bld: 86 mg/dL (ref 70–99)
Potassium: 3.9 mEq/L (ref 3.7–5.3)
Sodium: 139 mEq/L (ref 137–147)
Total Bilirubin: 0.3 mg/dL (ref 0.3–1.2)
Total Protein: 6.6 g/dL (ref 6.0–8.3)

## 2014-09-08 MED ORDER — SODIUM CHLORIDE 0.9 % IJ SOLN
10.0000 mL | INTRAMUSCULAR | Status: DC | PRN
  Administered 2014-09-08: 10 mL via INTRAVENOUS
  Filled 2014-09-08: qty 10

## 2014-09-08 MED ORDER — HEPARIN SOD (PORK) LOCK FLUSH 100 UNIT/ML IV SOLN
500.0000 [IU] | Freq: Once | INTRAVENOUS | Status: AC
  Administered 2014-09-08: 500 [IU] via INTRAVENOUS
  Filled 2014-09-08: qty 5

## 2014-09-08 NOTE — Patient Instructions (Signed)
[facility] Discharge Instructions  RECOMMENDATIONS MADE BY THE CONSULTANT AND ANY TEST RESULTS WILL BE SENT TO YOUR REFERRING PHYSICIAN.  Labs today Continue with port-flushes as directed Continue with current medications as ordered Continue follow-up with Dr. Willey Blade as directed. CT CAP in July 2016.  Thank you for choosing [facility] to provide your oncology and hematology care.  To afford each patient quality time with our providers, please arrive at least 15 minutes before your scheduled appointment time.  With your help, our goal is to use those 15 minutes to complete the necessary work-up to ensure our physicians have the information they need to help with your evaluation and healthcare recommendations.    Effective January 1st, 2014, we ask that you re-schedule your appointment with our physicians should you arrive 10 or more minutes late for your appointment.  We strive to give you quality time with our providers, and arriving late affects you and other patients whose appointments are after yours.    Again, thank you for choosing [facility].  Our hope is that these requests will decrease the amount of time that you wait before being seen by our physicians.       _____________________________________________________________  Should you have questions after your visit to [facility], please contact our office at [phone] between the hours of 8:30 a.m. and 5:00 p.m.  Voicemails left after 4:30 p.m. will not be returned until the following business day.  For prescription refill requests, have your pharmacy contact our office with your prescription refill request.

## 2014-09-08 NOTE — Progress Notes (Signed)
Please see doctors visit encounter for more information

## 2014-09-08 NOTE — Progress Notes (Signed)
[name] presented for labwork. Labs per MD order drawn via Portacath located in the right chest wall accessed with  H 20 needle. Good blood return present. Procedure without incident.  Needle removed intact. Patient tolerated procedure well.

## 2014-09-09 ENCOUNTER — Other Ambulatory Visit (HOSPITAL_COMMUNITY): Payer: Self-pay | Admitting: Oncology

## 2014-09-09 DIAGNOSIS — D509 Iron deficiency anemia, unspecified: Secondary | ICD-10-CM

## 2014-09-09 LAB — TSH: TSH: 3.44 u[IU]/mL (ref 0.350–4.500)

## 2014-09-09 LAB — FERRITIN: Ferritin: 9 ng/mL — ABNORMAL LOW (ref 10–291)

## 2014-09-15 ENCOUNTER — Encounter (HOSPITAL_BASED_OUTPATIENT_CLINIC_OR_DEPARTMENT_OTHER): Payer: Medicare Other

## 2014-09-15 VITALS — BP 127/53 | HR 61 | Temp 98.2°F | Resp 20

## 2014-09-15 DIAGNOSIS — E611 Iron deficiency: Secondary | ICD-10-CM | POA: Insufficient documentation

## 2014-09-15 DIAGNOSIS — D509 Iron deficiency anemia, unspecified: Secondary | ICD-10-CM

## 2014-09-15 MED ORDER — HEPARIN SOD (PORK) LOCK FLUSH 100 UNIT/ML IV SOLN
500.0000 [IU] | Freq: Once | INTRAVENOUS | Status: AC | PRN
  Administered 2014-09-15: 500 [IU]
  Filled 2014-09-15: qty 5

## 2014-09-15 MED ORDER — SODIUM CHLORIDE 0.9 % IJ SOLN
3.0000 mL | Freq: Once | INTRAMUSCULAR | Status: DC | PRN
Start: 2014-09-15 — End: 2014-09-15

## 2014-09-15 MED ORDER — SODIUM CHLORIDE 0.9 % IJ SOLN
10.0000 mL | INTRAMUSCULAR | Status: DC | PRN
  Administered 2014-09-15: 10 mL
  Filled 2014-09-15: qty 10

## 2014-09-15 MED ORDER — SODIUM CHLORIDE 0.9 % IV SOLN
Freq: Once | INTRAVENOUS | Status: AC
  Administered 2014-09-15: 14:00:00 via INTRAVENOUS

## 2014-09-15 MED ORDER — SODIUM CHLORIDE 0.9 % IV SOLN
510.0000 mg | Freq: Once | INTRAVENOUS | Status: AC
  Administered 2014-09-15: 510 mg via INTRAVENOUS
  Filled 2014-09-15: qty 17

## 2014-09-15 NOTE — Patient Instructions (Signed)
[facility] Discharge Instructions  RECOMMENDATIONS MADE BY THE CONSULTANT AND ANY TEST RESULTS WILL BE SENT TO YOUR REFERRING PHYSICIAN.  MEDICATIONS PRESCRIBED:  IV Feraheme infusion #1 of 2 today.  INSTRUCTIONS/FOLLOW-UP: Return to clinic as scheduled next week for IV Feraheme infusion #2 of 2.  Thank you for choosing [facility] to provide your oncology and hematology care.  To afford each patient quality time with our providers, please arrive at least 15 minutes before your scheduled appointment time.  With your help, our goal is to use those 15 minutes to complete the necessary work-up to ensure our physicians have the information they need to help with your evaluation and healthcare recommendations.    Effective January 1st, 2014, we ask that you re-schedule your appointment with our physicians should you arrive 10 or more minutes late for your appointment.  We strive to give you quality time with our providers, and arriving late affects you and other patients whose appointments are after yours.    Again, thank you for choosing [facility].  Our hope is that these requests will decrease the amount of time that you wait before being seen by our physicians.       _____________________________________________________________  Should you have questions after your visit to [facility], please contact our office at [phone] between the hours of 8:30 a.m. and 4:30 p.m.  Voicemails left after 4:30 p.m. will not be returned until the following business day.  For prescription refill requests, have your pharmacy contact our office with your prescription refill request.    _______________________________________________________________  We hope that we have given you very good care.  You may receive a patient satisfaction survey in the mail, please complete it and return it as soon as possible.  We value your  feedback!  _______________________________________________________________  Have you asked about our STAR program?  STAR stands for Survivorship Training and Rehabilitation, and this is a nationally recognized cancer care program that focuses on survivorship and rehabilitation.  Cancer and cancer treatments may cause problems, such as, pain, making you feel tired and keeping you from doing the things that you need or want to do. Cancer rehabilitation can help. Our goal is to reduce these troubling effects and help you have the best quality of life possible.  You may receive a survey from a nurse that asks questions about your current state of health.  Based on the survey results, all eligible patients will be referred to the [facility] program for an evaluation so we can better serve you!  A frequently asked questions sheet is available upon request.

## 2014-09-15 NOTE — Progress Notes (Signed)
Tolerated well

## 2014-09-22 ENCOUNTER — Encounter (HOSPITAL_COMMUNITY): Payer: Self-pay

## 2014-09-22 ENCOUNTER — Encounter (HOSPITAL_BASED_OUTPATIENT_CLINIC_OR_DEPARTMENT_OTHER): Payer: Medicare Other

## 2014-09-22 DIAGNOSIS — E611 Iron deficiency: Secondary | ICD-10-CM

## 2014-09-22 DIAGNOSIS — D509 Iron deficiency anemia, unspecified: Secondary | ICD-10-CM

## 2014-09-22 MED ORDER — HEPARIN SOD (PORK) LOCK FLUSH 100 UNIT/ML IV SOLN
500.0000 [IU] | Freq: Once | INTRAVENOUS | Status: AC | PRN
  Administered 2014-09-22: 500 [IU]
  Filled 2014-09-22: qty 5

## 2014-09-22 MED ORDER — SODIUM CHLORIDE 0.9 % IV SOLN
510.0000 mg | Freq: Once | INTRAVENOUS | Status: AC
  Administered 2014-09-22: 510 mg via INTRAVENOUS
  Filled 2014-09-22: qty 17

## 2014-09-22 MED ORDER — SODIUM CHLORIDE 0.9 % IJ SOLN: 10.0000 mL | INTRAMUSCULAR | Status: DC | PRN

## 2014-09-22 MED ORDER — SODIUM CHLORIDE 0.9 % IV SOLN
Freq: Once | INTRAVENOUS | Status: AC
  Administered 2014-09-22: 13:00:00 via INTRAVENOUS

## 2014-09-22 NOTE — Progress Notes (Signed)
Patient tolerated infusion well.

## 2014-09-22 NOTE — Patient Instructions (Signed)
[facility] Discharge Instructions  RECOMMENDATIONS MADE BY THE CONSULTANT AND ANY TEST RESULTS WILL BE SENT TO YOUR REFERRING PHYSICIAN. Today you received feraheme Please follow up as scheduled. Call for any questions or concerns.   Thank you for choosing [facility] to provide your oncology and hematology care.  To afford each patient quality time with our providers, please arrive at least 15 minutes before your scheduled appointment time.  With your help, our goal is to use those 15 minutes to complete the necessary work-up to ensure our physicians have the information they need to help with your evaluation and healthcare recommendations.    Effective January 1st, 2014, we ask that you re-schedule your appointment with our physicians should you arrive 10 or more minutes late for your appointment.  We strive to give you quality time with our providers, and arriving late affects you and other patients whose appointments are after yours.    Again, thank you for choosing [facility].  Our hope is that these requests will decrease the amount of time that you wait before being seen by our physicians.       _____________________________________________________________  Should you have questions after your visit to [facility], please contact our office at [phone] between the hours of 8:30 a.m. and 4:30 p.m.  Voicemails left after 4:30 p.m. will not be returned until the following business day.  For prescription refill requests, have your pharmacy contact our office with your prescription refill request.    _______________________________________________________________  We hope that we have given you very good care.  You may receive a patient satisfaction survey in the mail, please complete it and return it as soon as possible.  We value your feedback!  _______________________________________________________________  Have you  asked about our STAR program?  STAR stands for Survivorship Training and Rehabilitation, and this is a nationally recognized cancer care program that focuses on survivorship and rehabilitation.  Cancer and cancer treatments may cause problems, such as, pain, making you feel tired and keeping you from doing the things that you need or want to do. Cancer rehabilitation can help. Our goal is to reduce these troubling effects and help you have the best quality of life possible.  You may receive a survey from a nurse that asks questions about your current state of health.  Based on the survey results, all eligible patients will be referred to the [facility] program for an evaluation so we can better serve you!  A frequently asked questions sheet is available upon request.

## 2014-09-28 ENCOUNTER — Other Ambulatory Visit: Payer: Self-pay | Admitting: *Deleted

## 2014-09-28 MED ORDER — ATENOLOL 25 MG PO TABS: 25.0000 mg | ORAL_TABLET | Freq: Every day | ORAL | Status: DC

## 2014-10-20 ENCOUNTER — Encounter (HOSPITAL_COMMUNITY): Payer: Medicare Other

## 2014-10-29 ENCOUNTER — Other Ambulatory Visit: Payer: Self-pay | Admitting: Cardiology

## 2014-11-03 ENCOUNTER — Encounter (HOSPITAL_COMMUNITY): Payer: Medicare Other

## 2014-11-05 ENCOUNTER — Encounter (HOSPITAL_COMMUNITY): Payer: Self-pay

## 2014-11-05 ENCOUNTER — Encounter (HOSPITAL_COMMUNITY): Payer: Medicare Other | Attending: Internal Medicine

## 2014-11-05 DIAGNOSIS — E611 Iron deficiency: Secondary | ICD-10-CM | POA: Insufficient documentation

## 2014-11-05 DIAGNOSIS — C760 Malignant neoplasm of head, face and neck: Secondary | ICD-10-CM

## 2014-11-05 DIAGNOSIS — C091 Malignant neoplasm of tonsillar pillar (anterior) (posterior): Secondary | ICD-10-CM | POA: Insufficient documentation

## 2014-11-05 DIAGNOSIS — C3401 Malignant neoplasm of right main bronchus: Secondary | ICD-10-CM | POA: Insufficient documentation

## 2014-11-05 DIAGNOSIS — E890 Postprocedural hypothyroidism: Secondary | ICD-10-CM | POA: Insufficient documentation

## 2014-11-05 DIAGNOSIS — Z452 Encounter for adjustment and management of vascular access device: Secondary | ICD-10-CM

## 2014-11-05 DIAGNOSIS — D509 Iron deficiency anemia, unspecified: Secondary | ICD-10-CM | POA: Insufficient documentation

## 2014-11-05 MED ORDER — HEPARIN SOD (PORK) LOCK FLUSH 100 UNIT/ML IV SOLN
500.0000 [IU] | Freq: Once | INTRAVENOUS | Status: AC
  Administered 2014-11-05: 500 [IU] via INTRAVENOUS
  Filled 2014-11-05: qty 5

## 2014-11-05 MED ORDER — SODIUM CHLORIDE 0.9 % IJ SOLN
10.0000 mL | INTRAMUSCULAR | Status: DC | PRN
  Administered 2014-11-05: 10 mL via INTRAVENOUS
  Filled 2014-11-05: qty 10

## 2014-11-05 NOTE — Progress Notes (Signed)
[name] presented for Portacath access and flush. Proper placement of portacath confirmed by CXR. Portacath located rt  chest wall accessed with  H 20 needle. Good blood return present. Portacath flushed with 30ml NS and 500U/57ml Heparin and needle removed intact. Procedure without incident. Patient tolerated procedure well.

## 2014-11-06 ENCOUNTER — Other Ambulatory Visit (HOSPITAL_COMMUNITY): Payer: Self-pay | Admitting: Internal Medicine

## 2014-11-06 DIAGNOSIS — Z0000 Encounter for general adult medical examination without abnormal findings: Secondary | ICD-10-CM

## 2014-11-06 DIAGNOSIS — Z1231 Encounter for screening mammogram for malignant neoplasm of breast: Secondary | ICD-10-CM

## 2014-11-10 ENCOUNTER — Ambulatory Visit (HOSPITAL_COMMUNITY)
Admission: RE | Admit: 2014-11-10 | Discharge: 2014-11-10 | Disposition: A | Discharge status: Home / Self Care | Payer: Medicare Other | Source: Ambulatory Visit | Attending: Internal Medicine | Admitting: Internal Medicine

## 2014-11-10 DIAGNOSIS — Z1231 Encounter for screening mammogram for malignant neoplasm of breast: Secondary | ICD-10-CM

## 2014-11-26 ENCOUNTER — Encounter: Payer: Self-pay | Admitting: Cardiology

## 2014-11-30 ENCOUNTER — Ambulatory Visit (INDEPENDENT_AMBULATORY_CARE_PROVIDER_SITE_OTHER): Payer: Medicare Other | Admitting: Cardiology

## 2014-11-30 ENCOUNTER — Encounter: Payer: Self-pay | Admitting: Cardiology

## 2014-11-30 VITALS — BP 118/70 | HR 64 | Ht 65.0 in | Wt 195.2 lb

## 2014-11-30 DIAGNOSIS — I10 Essential (primary) hypertension: Secondary | ICD-10-CM

## 2014-11-30 DIAGNOSIS — I6523 Occlusion and stenosis of bilateral carotid arteries: Secondary | ICD-10-CM | POA: Diagnosis not present

## 2014-11-30 DIAGNOSIS — I2510 Atherosclerotic heart disease of native coronary artery without angina pectoris: Secondary | ICD-10-CM

## 2014-11-30 NOTE — Progress Notes (Signed)
Cardiology Office Note  Date: 11/30/2014   ID: [name], DOB Oct 24, 1926, MRN [id number]  PCP: [name], MD  Primary Cardiologist: Rozann Lesches, MD   Chief Complaint  Patient presents with  . Coronary Artery Disease  . Hypertension  . History of atrial arrhythmias    History of Present Illness: [name] is an 81 y.o. female last seen in October 2015. Interval follow-up continues with Oncology. She presents for a routine visit today, denies any angina symptoms or palpitations. States that she feels like she is just slowing down as she gets older, remains unsteady but uses a walker and has not had any recent falls.  I reviewed her cardiac medications which are outlined below. She has had no recent changes.  Follow-up carotid Dopplers from last year are reviewed below.  She had interval lab work with Dr. Willey Blade.   Past Medical History  Diagnosis Date  . Essential hypertension, benign   . Crohn's disease   . Spinal stenosis   . PSVT (paroxysmal supraventricular tachycardia)   . History of TIAs   . GERD (gastroesophageal reflux disease)   . Carotid artery disease     RICA 50-69% 12/09  . Myocardial infarction   . COPD (chronic obstructive pulmonary disease)   . Thyroid mass   . Incontinence of urine   . Anxiety   . Arthritis   . Atrial fibrillation     Postoperative - suboptimal Coumadin candidate  . CAD (coronary artery disease), native coronary artery   . Port-a-cath in place 01/22/2012  . 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 Carcinoma of tonsillar pillars (anterior) (posterior)     XRT and resection     Current Outpatient Prescriptions  Medication Sig Dispense Refill  . albuterol (PROVENTIL) (2.5 MG/3ML) 0.083% nebulizer solution Take 2.5 mg by nebulization 4 (four) times daily.    Marland Kitchen ALPRAZolam (XANAX) 0.25 MG tablet Take 0.25 mg by mouth at bedtime as needed. For sleep.    Marland Kitchen atenolol (TENORMIN) 25  MG tablet Take 1 tablet (25 mg total) by mouth daily. 30 tablet 6  . Calcium & Magnesium Carbonates (MYLANTA PO) Take 15 mLs by mouth as needed.    . calcium carbonate (TUMS - DOSED IN MG ELEMENTAL CALCIUM) 500 MG chewable tablet Chew 1 tablet by mouth 2 (two) times daily as needed for heartburn.     . fluticasone (FLONASE) 50 MCG/ACT nasal spray Place 2 sprays into the nose daily.      . furosemide (LASIX) 40 MG tablet Take 40 mg by mouth 2 (two) times daily.     . Inulin (FIBER CHOICE PO) Take 1 capsule by mouth daily.    Marland Kitchen ipratropium (ATROVENT) 0.02 % nebulizer solution Take 250 mcg by nebulization 4 (four) times daily.    Marland Kitchen levothyroxine (SYNTHROID, LEVOTHROID) 75 MCG tablet Take 75 mcg by mouth every morning.    . lidocaine (LIDODERM) 5 % Place 1 patch onto the skin daily as needed (for pain). Remove & Discard patch within 12 hours or as directed by MD. Pain.    . magnesium hydroxide (MILK OF MAGNESIA) 400 MG/5ML suspension Take 30 mLs by mouth as needed for mild constipation.    . mirtazapine (REMERON) 15 MG tablet 15 mg tablet; 0.5 oral at bedtime; Dispense: 15    . pantoprazole (PROTONIX) 40 MG tablet Take 1 tablet (40 mg total) by mouth daily. 30 tablet 5  .  PENTASA 250 MG CR capsule TAKE 4 CAPSULES 4 TIMES DAILY. 480 capsule 11  . polyethylene glycol (MIRALAX / GLYCOLAX) packet Take 17 g by mouth daily.    . potassium chloride (K-DUR) 10 MEQ tablet Take 1 tablet (10 mEq total) by mouth daily. 90 tablet 1  . PROAIR HFA 108 (90 BASE) MCG/ACT inhaler Inhale 2 puffs into the lungs every 6 (six) hours as needed for wheezing or shortness of breath.      No current facility-administered medications for this visit.    Allergies:  Codeine; Demerol; Morphine and related; and Penicillins   Social History: The patient  reports that she quit smoking about 21 years ago. Her smoking use included Cigarettes. She has never used smokeless tobacco. She reports that she does not drink alcohol or use  illicit drugs.   ROS:  Please see the history of present illness. Otherwise, complete review of systems is positive for arthritic pains, unsteadiness.  All other systems are reviewed and negative.    Physical Exam: VS:  BP 118/70 mmHg  Pulse 64  Ht 5\' 5"  (1.651 m)  Wt 195 lb 3.2 oz (88.542 kg)  BMI 32.48 kg/m2  SpO2 95%, BMI Body mass index is 32.48 kg/(m^2).  Wt Readings from Last 3 Encounters:  11/30/14 195 lb 3.2 oz (88.542 kg)  09/08/14 203 lb 12.8 oz (92.443 kg)  07/07/14 202 lb (91.627 kg)     Overweight woman in no acute distress. Using walker.  HEENT: Conjuctivae and lids normal, oropharynx clear.  Neck: Supple, no JVD or obvious bruit.  Lungs: Clear, decreased breath sounds at bases.  Cardiac: Regular rate and rhythm, distant, soft systolic murmur, no S3.  Abdomen: Soft, NABS.  Skin: Warm and dry.  Extremities: Venous stasis and spider veins, right worse than left, 1-2+ edema.  Musculoskeletal: No kyphosis.  Neuropsychiatric: Alert and oriented x3, hard of hearing, affect appropriate.   ECG: ECG is not ordered today.  Recent Labwork:  Labwork from this month per Dr. Willey Blade showed potassium 4.6, BUN 17, creatinine 0.7.  Other Studies Reviewed Today:  1. Carotid Dopplers 06/02/2014: TECHNIQUE: Pearline Cables scale imaging, color Doppler and duplex ultrasound were performed of bilateral carotid and vertebral arteries in the neck.  COMPARISON: 06/04/2012  FINDINGS: Criteria: Quantification of carotid stenosis is based on velocity parameters that correlate the residual internal carotid diameter with NASCET-based stenosis levels, using the diameter of the distal internal carotid lumen as the denominator for stenosis measurement.  The following velocity measurements were obtained:  RIGHT  ICA: 175 cm/sec  CCA: 99 cm/sec  SYSTOLIC ICA/CCA RATIO: 1.8  DIASTOLIC ICA/CCA RATIO:  ECA: 44 cm/sec  LEFT  ICA: 154 cm/sec  CCA: 94  cm/sec  SYSTOLIC ICA/CCA RATIO: 1.6  DIASTOLIC ICA/CCA RATIO:  ECA: 129 cm/sec  RIGHT CAROTID ARTERY: There is calcified plaque in the upper common carotid. Scattered irregular plaque is present in the bulb. Low resistance internal carotid Doppler pattern.  RIGHT VERTEBRAL ARTERY: Antegrade with a normal Doppler pattern.  LEFT CAROTID ARTERY: There is scattered calcified plaque in the mid common carotid. There is some irregular plaque in the bulb. Low resistance internal carotid Doppler pattern.  LEFT VERTEBRAL ARTERY: Antegrade. Normal Doppler pattern.  IMPRESSION: There is now 50-69% stenosis in the bilateral internal carotid arteries. This is compatible with progression since the prior study.  2. Echocardiogram 04/16/2013: Study Conclusions  - Left ventricle: The cavity size was normal. There was moderate concentric hypertrophy. Systolic function was normal. The estimated ejection fraction  was 55%. Probable hypokinesis of the basal-midinferolateral myocardium. Doppler parameters are consistent with abnormal left ventricular relaxation (grade 1 diastolic dysfunction). Borderline increased filling pressures. - Aortic valve: Mildly calcified annulus. Trileaflet; moderately calcified leaflets. - Mitral valve: Calcified annulus. Trivial regurgitation. - Left atrium: The atrium was mildly dilated. - Right ventricle: The cavity size was normal. Wall thickness was mildly increased. - Tricuspid valve: Trivial regurgitation. - Pulmonary arteries: Systolic pressure could not be accurately estimated. - Inferior vena cava: Not well visualized. The vessel was dilated. Inable to provide CVP estimate, but suspect elevated to some degree. - Pericardium, extracardiac: A prominent pericardial fat pad was present. - Impressions: Comparison made to prior study from August 2013. There is moderate LVH with LVEF approximately 55%, inferolateral  hypokinesis noted. Grade 1 diastolic dysfunction with borderline increased filling pressures. Mild left atrial enlargement. Unable to assess PASP, suspect elevated CVP basedon limitedviews. Impressions:  - Comparison made to prior study from August 2013. There is moderate LVH with LVEF approximately 55%, inferolateral hypokinesis noted. Grade 1 diastolic dysfunction with borderline increased filling pressures. Mild left atrial enlargement. Unable to assess PASP, suspect elevated CVP basedon limitedviews.   ASSESSMENT AND PLAN:  1. CAD, symptomatically stable on medical therapy with LVEF approximately 55% by last assessment. Plan is to continue medical therapy and observation for now.  2. Moderate carotid artery disease.  3. Essential hypertension, blood pressure control is good today.  Current medicines are reviewed at length with the patient today.  The patient does not have concerns regarding medicines.  Disposition: FU with me in 6 months.   Signed, Satira Sark, MD, [facility] 11/30/2014 11:44 AM    [place] [facility] [address], [place], [place] [address] Phone: [phone]; Fax: [phone]

## 2014-11-30 NOTE — Patient Instructions (Addendum)
Your physician wants you to follow-up in: 6 months Dr. Domenic Polite. You will receive a reminder letter in the mail two months in advance. If you don't receive a letter, please call our office to schedule the follow-up appointment.  Your physician recommends that you continue on your current medications as directed. Please refer to the Current Medication list given to you today.  We request Lab results from Dr. Willey Blade  Thank you for choosing [facility]!

## 2014-12-01 ENCOUNTER — Encounter (HOSPITAL_COMMUNITY): Payer: Medicare Other

## 2014-12-15 ENCOUNTER — Encounter (HOSPITAL_COMMUNITY): Payer: Self-pay

## 2014-12-15 ENCOUNTER — Encounter (HOSPITAL_COMMUNITY): Payer: Medicare Other | Attending: Internal Medicine

## 2014-12-15 VITALS — BP 118/60 | HR 63 | Temp 97.5°F | Resp 20

## 2014-12-15 DIAGNOSIS — C7800 Secondary malignant neoplasm of unspecified lung: Secondary | ICD-10-CM

## 2014-12-15 DIAGNOSIS — Z452 Encounter for adjustment and management of vascular access device: Secondary | ICD-10-CM

## 2014-12-15 DIAGNOSIS — C760 Malignant neoplasm of head, face and neck: Secondary | ICD-10-CM

## 2014-12-15 DIAGNOSIS — Z95828 Presence of other vascular implants and grafts: Secondary | ICD-10-CM

## 2014-12-15 DIAGNOSIS — C3401 Malignant neoplasm of right main bronchus: Secondary | ICD-10-CM | POA: Insufficient documentation

## 2014-12-15 DIAGNOSIS — E890 Postprocedural hypothyroidism: Secondary | ICD-10-CM | POA: Insufficient documentation

## 2014-12-15 DIAGNOSIS — E611 Iron deficiency: Secondary | ICD-10-CM | POA: Insufficient documentation

## 2014-12-15 DIAGNOSIS — D509 Iron deficiency anemia, unspecified: Secondary | ICD-10-CM | POA: Insufficient documentation

## 2014-12-15 DIAGNOSIS — C091 Malignant neoplasm of tonsillar pillar (anterior) (posterior): Secondary | ICD-10-CM | POA: Insufficient documentation

## 2014-12-15 MED ORDER — SODIUM CHLORIDE 0.9 % IJ SOLN
10.0000 mL | INTRAMUSCULAR | Status: DC | PRN
  Administered 2014-12-15: 10 mL via INTRAVENOUS
  Filled 2014-12-15: qty 10

## 2014-12-15 MED ORDER — HEPARIN SOD (PORK) LOCK FLUSH 100 UNIT/ML IV SOLN
500.0000 [IU] | Freq: Once | INTRAVENOUS | Status: AC
  Administered 2014-12-15: 500 [IU] via INTRAVENOUS
  Filled 2014-12-15: qty 5

## 2014-12-15 NOTE — Patient Instructions (Signed)
[place] at [facility] Discharge Instructions  RECOMMENDATIONS MADE BY THE CONSULTANT AND ANY TEST RESULTS WILL BE SENT TO YOUR REFERRING PHYSICIAN.  You got your port flushed today. Will see you next month for your port flush.  Thank you for choosing [place] at [facility] to provide your oncology and hematology care.  To afford each patient quality time with our provider, please arrive at least 15 minutes before your scheduled appointment time.    You need to re-schedule your appointment should you arrive 10 or more minutes late.  We strive to give you quality time with our providers, and arriving late affects you and other patients whose appointments are after yours.  Also, if you no show three or more times for appointments you may be dismissed from the clinic at the providers discretion.     Again, thank you for choosing [facility].  Our hope is that these requests will decrease the amount of time that you wait before being seen by our physicians.       _____________________________________________________________  Should you have questions after your visit to [facility], please contact our office at [phone] between the hours of 8:30 a.m. and 4:30 p.m.  Voicemails left after 4:30 p.m. will not be returned until the following business day.  For prescription refill requests, have your pharmacy contact our office.

## 2014-12-15 NOTE — Progress Notes (Signed)
[name] presented for Portacath access and flush. Portacath located rigth chest wall accessed with  H 20 needle. Good blood return present. Portacath flushed with 2ml NS and 500U/69ml Heparin and needle removed intact. Procedure without incident. Patient tolerated procedure well.

## 2015-01-12 ENCOUNTER — Encounter (HOSPITAL_COMMUNITY): Payer: Medicare Other

## 2015-01-26 ENCOUNTER — Encounter (HOSPITAL_COMMUNITY): Payer: Medicare Other

## 2015-02-02 ENCOUNTER — Encounter (HOSPITAL_COMMUNITY): Payer: Medicare Other | Attending: Internal Medicine

## 2015-02-02 ENCOUNTER — Encounter (HOSPITAL_COMMUNITY): Payer: Self-pay

## 2015-02-02 DIAGNOSIS — C760 Malignant neoplasm of head, face and neck: Secondary | ICD-10-CM

## 2015-02-02 DIAGNOSIS — E890 Postprocedural hypothyroidism: Secondary | ICD-10-CM | POA: Insufficient documentation

## 2015-02-02 DIAGNOSIS — E611 Iron deficiency: Secondary | ICD-10-CM | POA: Insufficient documentation

## 2015-02-02 DIAGNOSIS — C091 Malignant neoplasm of tonsillar pillar (anterior) (posterior): Secondary | ICD-10-CM | POA: Insufficient documentation

## 2015-02-02 DIAGNOSIS — D509 Iron deficiency anemia, unspecified: Secondary | ICD-10-CM | POA: Insufficient documentation

## 2015-02-02 DIAGNOSIS — C3401 Malignant neoplasm of right main bronchus: Secondary | ICD-10-CM | POA: Insufficient documentation

## 2015-02-02 DIAGNOSIS — Z452 Encounter for adjustment and management of vascular access device: Secondary | ICD-10-CM | POA: Diagnosis present

## 2015-02-02 MED ORDER — HEPARIN SOD (PORK) LOCK FLUSH 100 UNIT/ML IV SOLN
500.0000 [IU] | Freq: Once | INTRAVENOUS | Status: AC
  Administered 2015-02-02: 500 [IU] via INTRAVENOUS

## 2015-02-02 MED ORDER — SODIUM CHLORIDE 0.9 % IJ SOLN
10.0000 mL | INTRAMUSCULAR | Status: DC | PRN
  Administered 2015-02-02: 10 mL via INTRAVENOUS
  Filled 2015-02-02: qty 10

## 2015-02-02 NOTE — Patient Instructions (Signed)
[facility] at [facility] Discharge Instructions  RECOMMENDATIONS MADE BY THE CONSULTANT AND ANY TEST RESULTS WILL BE SENT TO YOUR REFERRING PHYSICIAN.  Port flush today. Return as scheduled for port flushes and office visit.  Thank you for choosing [facility] at [facility] to provide your oncology and hematology care.  To afford each patient quality time with our provider, please arrive at least 15 minutes before your scheduled appointment time.    You need to re-schedule your appointment should you arrive 10 or more minutes late.  We strive to give you quality time with our providers, and arriving late affects you and other patients whose appointments are after yours.  Also, if you no show three or more times for appointments you may be dismissed from the clinic at the providers discretion.     Again, thank you for choosing [facility].  Our hope is that these requests will decrease the amount of time that you wait before being seen by our physicians.       _____________________________________________________________  Should you have questions after your visit to [facility], please contact our office at [phone] between the hours of 8:30 a.m. and 4:30 p.m.  Voicemails left after 4:30 p.m. will not be returned until the following business day.  For prescription refill requests, have your pharmacy contact our office.    

## 2015-02-02 NOTE — Progress Notes (Signed)
[name] presented for Portacath access and flush. Portacath located right chest wall accessed with  H 20 needle.  Good blood return present. Portacath flushed with 74m NS and 500U/521mHeparin and needle removed intact.  Procedure tolerated well and without incident.

## 2015-02-10 ENCOUNTER — Other Ambulatory Visit (INDEPENDENT_AMBULATORY_CARE_PROVIDER_SITE_OTHER): Payer: Self-pay | Admitting: Internal Medicine

## 2015-02-10 NOTE — Telephone Encounter (Signed)
Ordered

## 2015-02-15 ENCOUNTER — Encounter (INDEPENDENT_AMBULATORY_CARE_PROVIDER_SITE_OTHER): Payer: Self-pay | Admitting: *Deleted

## 2015-02-15 ENCOUNTER — Ambulatory Visit (INDEPENDENT_AMBULATORY_CARE_PROVIDER_SITE_OTHER): Payer: Medicare Other | Admitting: Internal Medicine

## 2015-02-15 ENCOUNTER — Other Ambulatory Visit (INDEPENDENT_AMBULATORY_CARE_PROVIDER_SITE_OTHER): Payer: Self-pay | Admitting: *Deleted

## 2015-02-15 ENCOUNTER — Encounter (INDEPENDENT_AMBULATORY_CARE_PROVIDER_SITE_OTHER): Payer: Self-pay | Admitting: Internal Medicine

## 2015-02-15 VITALS — BP 118/50 | HR 60 | Temp 97.5°F | Ht 65.5 in | Wt 201.7 lb

## 2015-02-15 DIAGNOSIS — K5090 Crohn's disease, unspecified, without complications: Secondary | ICD-10-CM | POA: Diagnosis not present

## 2015-02-15 DIAGNOSIS — K625 Hemorrhage of anus and rectum: Secondary | ICD-10-CM | POA: Diagnosis not present

## 2015-02-15 DIAGNOSIS — I6523 Occlusion and stenosis of bilateral carotid arteries: Secondary | ICD-10-CM | POA: Diagnosis not present

## 2015-02-15 DIAGNOSIS — R109 Unspecified abdominal pain: Secondary | ICD-10-CM | POA: Diagnosis not present

## 2015-02-15 DIAGNOSIS — R1310 Dysphagia, unspecified: Secondary | ICD-10-CM

## 2015-02-15 NOTE — Patient Instructions (Signed)
EGD/ED. CT  Abdomen/pelvis with CM.

## 2015-02-15 NOTE — Progress Notes (Signed)
Subjective:    Patient ID: [name], female    DOB: 10/23/1926, 81 y.o.   MRN: [id number]  HPI Presents today with c/o dysphagia. She tells me she is having trouble swallowing. She says her pills are not going down. She is having trouble swallowing meats.  She says she has bloating. Sometimes she may go 3 days without having a BM.  She says she is having dysphagia on a daily basis. She is avoiding meats, rice, breads. She can eat soft foods.  Appetite is good. No weight loss.  She has a BM once every 3 days and sometimes she may 3 the following days. She says her BMs are up and down . She also c/o lower abdominal pain which started about 1 1/2 weeks. Patient is requesting a CT scan. She says the pain is piercing. No fever. She has some nausea. She says she has had this pain before but she was constipated. She saw blood after having a BM [place].  Hx Crohn's and is maintained on Pentasa. She was diagnosed with Crohn's years ago.  Hx of Lung cancer and tonsillar cancer.She will be following up with Dr. Whitney Muse next month.  Receive Iron infusion December 2015  She tells me she has not undergone a colonoscopy in years   CBC    Component Value Date/Time   WBC 6.7 09/08/2014 1410   RBC 3.70* 09/08/2014 1410   HGB 9.7* 09/08/2014 1410   HCT 31.2* 09/08/2014 1410   PLT 342 09/08/2014 1410   MCV 84.3 09/08/2014 1410   MCH 26.2 09/08/2014 1410   MCHC 31.1 09/08/2014 1410   RDW 16.4* 09/08/2014 1410   LYMPHSABS 1.1 09/08/2014 1410   MONOABS 0.7 09/08/2014 1410   EOSABS 0.2 09/08/2014 1410   BASOSABS 0.0 09/08/2014 1410        04/26/2014 EGD/ED  Indications: Patient is a 84-year-old Caucasian female with history of GERD and Schatzki's ring who presents with solid food dysphagia. She has undergone multiple dilations in the past. Last dilation was in August 2014 but no ring was found. She responded to dilation until a few months ago. She also complains of bloating and dyspnea in the  middle of night and she has to sit up in order to breathe better. He has not experienced a regurgitation or vomiting.  Impression: Mild changes of reflux esophagitis limited to GE junction without ring or stricture formation. Small sliding hiatal hernia. Erosive gastroduodenitis. Esophagus dilated by passing a 56 French Maloney dilator but no mucosal disruption induced.  Comment; She may have esophageal motility disorder since no structural abnormality noted to esophagus other than mild changes of reflux esophagitis  05/06/2013 EGD/ED  Indications: Patient is a 81 year old Caucasian female with multiple medical problems including GERD who presents with dysphagia to solids and pills. Her last dilation was one year ago and she had good response.  Impression: Small sliding hiatal hernia without ring or stricture formation. Esophagus dilated by passing 26 French balloon dilator but no mucosal disruption induced. Nonerosive antral gastritis.   05/06/2012 EGD/ED: Indications: Patient is 81 year old Caucasian female with multiple medical problems including chronic GERD who presents with solid food dysphagia. She has history of erosive esophagitis and Schatzki's ring. Last EGD/ED was in January 2010. Impression: Small sliding hiatal hernia with changes of esophagitis at GE junction. Normal examination of stomach first and second part of the duodenum. Esophagus dilated by passing 56 French Maloney dilator but no mucosal disruption induced   Review of Systems  Past Medical History  Diagnosis Date  . Essential hypertension, benign   . Crohn's disease   . Spinal stenosis   .  PSVT (paroxysmal supraventricular tachycardia)   . History of TIAs   . GERD (gastroesophageal reflux disease)   . Carotid artery disease     RICA 50-69% 12/09  . Myocardial infarction   . COPD (chronic obstructive pulmonary disease)   . Thyroid mass   . Incontinence of urine   . Anxiety   . Arthritis   . Atrial fibrillation     Postoperative - suboptimal Coumadin candidate  . CAD (coronary artery disease), native coronary artery   . Port-a-cath in place 01/22/2012  . 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 Carcinoma of tonsillar pillars (anterior) (posterior)     XRT and resection    Past Surgical History  Procedure Laterality Date  . Left thoracotomy with wedge resection  2008    Left lower lobe  . Right tonsillectomy  2005  . Left thyroidectomy  2006  . Left breast biopsy  2011  . US echocardiography    . Cholecystectomy    . Cardiovascular stress test    . Abdominal hysterectomy    . Thyroidectomy  08/30/2011    Procedure: THYROIDECTOMY;  Surgeon: Cecil Cranker;  Location: [facility] OR;  Service: ENT;  Laterality: Right;  . Esophagogastroduodenoscopy (egd) with esophageal dilation N/A 05/06/2013    Procedure: ESOPHAGOGASTRODUODENOSCOPY (EGD) WITH ESOPHAGEAL DILATION;  Surgeon: Rogene Houston, MD;  Location: [facility] ENDO SUITE;  Service: Endoscopy;  Laterality: N/A;  230  . Esophagogastroduodenoscopy N/A 04/27/2014    Procedure: ESOPHAGOGASTRODUODENOSCOPY (EGD);  Surgeon: Rogene Houston, MD;  Location: [facility];  Service: Endoscopy;  Laterality: N/A;  1230  . Balloon dilation N/A 04/27/2014    Procedure: BALLOON DILATION;  Surgeon: Rogene Houston, MD;  Location: [facility];  Service: Endoscopy;  Laterality: N/A;  Venia Minks dilation N/A 04/27/2014    Procedure: Venia Minks DILATION;  Surgeon: Rogene Houston, MD;  Location: [facility];  Service: Endoscopy;  Laterality: N/A;  . Savory dilation N/A 04/27/2014    Procedure: SAVORY DILATION;   Surgeon: Rogene Houston, MD;  Location: [facility];  Service: Endoscopy;  Laterality: N/A;    Allergies  Allergen Reactions  . Codeine Nausea And Vomiting  . Demerol Other (See Comments)    Blood pressure  . Morphine And Related Nausea And Vomiting  . Penicillins Hives and Swelling    Current Outpatient Prescriptions on File Prior to Visit  Medication Sig Dispense Refill  . albuterol (PROVENTIL) (2.5 MG/3ML) 0.083% nebulizer solution Take 2.5 mg by nebulization 4 (four) times daily.    Marland Kitchen ALPRAZolam (XANAX) 0.25 MG tablet Take 0.25 mg by mouth at bedtime as needed. For sleep.    Marland Kitchen aspirin 81 MG EC tablet 81 mg tablet; oral    . atenolol (TENORMIN) 25 MG tablet Take 1 tablet (25 mg total) by mouth daily. 30 tablet 6  . Calcium & Magnesium Carbonates (MYLANTA PO) Take 15 mLs by mouth as needed.    . calcium carbonate (TUMS - DOSED IN MG ELEMENTAL CALCIUM) 500 MG chewable tablet Chew 1 tablet by mouth 2 (two) times daily as needed for heartburn.     . Calcium Carbonate Antacid (TUMS E-X PO) Dosage Unknown; 1 two times a day    . fluticasone (FLONASE) 50 MCG/ACT nasal spray Place 2 sprays into the  nose daily.      . furosemide (LASIX) 40 MG tablet Take 40 mg by mouth 2 (two) times daily.     . hyoscyamine (LEVSIN SL) 0.125 MG SL tablet 0.125 mg tablet; sublingual three times a day as needed; Dispense: 30    . Inulin (FIBER CHOICE PO) Take 1 capsule by mouth daily.    Marland Kitchen ipratropium (ATROVENT) 0.02 % nebulizer solution Take 250 mcg by nebulization 4 (four) times daily.    Marland Kitchen ketoconazole (NIZORAL) 2 % cream 2% cream; small amount topical two times a day; Dispense: 30    . levothyroxine (SYNTHROID, LEVOTHROID) 75 MCG tablet Take 75 mcg by mouth every morning.    . lidocaine (LIDODERM) 5 % Place 1 patch onto the skin daily as needed (for pain). Remove & Discard patch within 12 hours or as directed by MD. Pain.    . magnesium hydroxide (MILK OF MAGNESIA) 400 MG/5ML suspension Take 30 mLs by  mouth as needed for mild constipation.    . mirtazapine (REMERON) 15 MG tablet as needed. 15 mg tablet; 0.5 oral at bedtime; Dispense: 15    . pantoprazole (PROTONIX) 40 MG tablet Take 1 tablet (40 mg total) by mouth daily. 30 tablet 5  . PENTASA 250 MG CR capsule TAKE 4 CAPSULES 4 TIMES DAILY. 480 capsule 3  . potassium chloride (K-DUR) 10 MEQ tablet Take 1 tablet (10 mEq total) by mouth daily. 90 tablet 1  . PROAIR HFA 108 (90 BASE) MCG/ACT inhaler Inhale 2 puffs into the lungs every 6 (six) hours as needed for wheezing or shortness of breath.     . furosemide (LASIX) 40 MG tablet 40 mg tablet; TAKE 1 AND 1/2 TABLETS BY MOUTH ONCE DAILY.; Dispense: 45    . polyethylene glycol (MIRALAX / GLYCOLAX) packet Take 17 g by mouth daily.     No current facility-administered medications on file prior to visit.        Objective:   Physical ExamBlood pressure 118/50, pulse 60, temperature 97.5 F (36.4 C), height 5' 5.5" (1.664 m), weight 201 lb 11.2 oz (91.491 kg). Alert and oriented. Skin warm and dry. Oral mucosa is moist.   . Sclera anicteric, conjunctivae is pink. Thyroid not enlarged. No cervical lymphadenopathy. Lungs clear. Heart regular rate and rhythm.  Abdomen is soft. Bowel sounds are positive. No hepatomegaly. No abdominal masses felt. No tenderness.  No edema to lower extremities.   Stool brown and guaiac positive.    Developer: [id number] ( 08/2018) Card: Lot [id number] (12/18)     Assessment & Plan:  Dysphagia tosolids and pills.  Patient need EGD/ED to rule out stricture. Abdominal pain: Hx of Crohn's disease. Guaiac positive stool. Will get a CT abdomen/pelvis with CM. Colonic neoplasm needs to be ruled out.

## 2015-02-16 LAB — CBC WITH DIFFERENTIAL/PLATELET
Basophils Absolute: 0 10*3/uL (ref 0.0–0.1)
Basophils Relative: 0 % (ref 0–1)
Eosinophils Absolute: 0.1 10*3/uL (ref 0.0–0.7)
Eosinophils Relative: 2 % (ref 0–5)
HCT: 40.3 % (ref 36.0–46.0)
HEMOGLOBIN: 13.2 g/dL (ref 12.0–15.0)
LYMPHS PCT: 22 % (ref 12–46)
Lymphs Abs: 1.6 10*3/uL (ref 0.7–4.0)
MCH: 29.5 pg (ref 26.0–34.0)
MCHC: 32.8 g/dL (ref 30.0–36.0)
MCV: 90.2 fL (ref 78.0–100.0)
MPV: 9.7 fL (ref 8.6–12.4)
Monocytes Absolute: 0.6 10*3/uL (ref 0.1–1.0)
Monocytes Relative: 9 % (ref 3–12)
NEUTROS ABS: 4.8 10*3/uL (ref 1.7–7.7)
Neutrophils Relative %: 67 % (ref 43–77)
Platelets: 287 10*3/uL (ref 150–400)
RBC: 4.47 MIL/uL (ref 3.87–5.11)
RDW: 15 % (ref 11.5–15.5)
WBC: 7.2 10*3/uL (ref 4.0–10.5)

## 2015-02-16 LAB — CREATININE, SERUM: CREATININE: 0.79 mg/dL (ref 0.50–1.10)

## 2015-02-17 ENCOUNTER — Ambulatory Visit (HOSPITAL_COMMUNITY)
Admission: RE | Admit: 2015-02-17 | Discharge: 2015-02-17 | Disposition: A | Discharge status: Home / Self Care | Payer: Medicare Other | Source: Ambulatory Visit | Attending: Internal Medicine | Admitting: Internal Medicine

## 2015-02-17 DIAGNOSIS — R194 Change in bowel habit: Secondary | ICD-10-CM | POA: Diagnosis not present

## 2015-02-17 DIAGNOSIS — K5090 Crohn's disease, unspecified, without complications: Secondary | ICD-10-CM

## 2015-02-17 DIAGNOSIS — R109 Unspecified abdominal pain: Secondary | ICD-10-CM | POA: Insufficient documentation

## 2015-02-17 DIAGNOSIS — Z90710 Acquired absence of both cervix and uterus: Secondary | ICD-10-CM | POA: Insufficient documentation

## 2015-02-17 DIAGNOSIS — R933 Abnormal findings on diagnostic imaging of other parts of digestive tract: Secondary | ICD-10-CM | POA: Insufficient documentation

## 2015-02-17 DIAGNOSIS — K625 Hemorrhage of anus and rectum: Secondary | ICD-10-CM

## 2015-02-17 DIAGNOSIS — N281 Cyst of kidney, acquired: Secondary | ICD-10-CM | POA: Diagnosis not present

## 2015-02-17 DIAGNOSIS — K5730 Diverticulosis of large intestine without perforation or abscess without bleeding: Secondary | ICD-10-CM | POA: Insufficient documentation

## 2015-02-17 MED ORDER — IOHEXOL 300 MG/ML  SOLN
100.0000 mL | Freq: Once | INTRAMUSCULAR | Status: AC | PRN
  Administered 2015-02-17: 100 mL via INTRAVENOUS

## 2015-02-22 ENCOUNTER — Telehealth (INDEPENDENT_AMBULATORY_CARE_PROVIDER_SITE_OTHER): Payer: Self-pay | Admitting: *Deleted

## 2015-02-22 NOTE — Telephone Encounter (Signed)
[name] would like to get her CT results. The return phone number is [phone].

## 2015-02-23 ENCOUNTER — Encounter (HOSPITAL_COMMUNITY): Payer: Medicare Other

## 2015-02-23 ENCOUNTER — Ambulatory Visit (HOSPITAL_COMMUNITY): Payer: Medicare Other | Admitting: Oncology

## 2015-02-23 ENCOUNTER — Ambulatory Visit (HOSPITAL_COMMUNITY): Payer: Medicare Other | Admitting: Hematology & Oncology

## 2015-02-23 NOTE — Telephone Encounter (Signed)
Results have been given to patient 

## 2015-02-25 ENCOUNTER — Encounter (HOSPITAL_COMMUNITY)
Admission: RE | Disposition: A | Discharge status: Home / Self Care | Payer: Self-pay | Source: Ambulatory Visit | Attending: Internal Medicine

## 2015-02-25 ENCOUNTER — Ambulatory Visit (HOSPITAL_COMMUNITY)
Admission: RE | Admit: 2015-02-25 | Discharge: 2015-02-25 | Disposition: A | Discharge status: Home / Self Care | Payer: Medicare Other | Source: Ambulatory Visit | Attending: Internal Medicine | Admitting: Internal Medicine

## 2015-02-25 ENCOUNTER — Encounter (HOSPITAL_COMMUNITY): Payer: Self-pay

## 2015-02-25 DIAGNOSIS — J449 Chronic obstructive pulmonary disease, unspecified: Secondary | ICD-10-CM | POA: Diagnosis not present

## 2015-02-25 DIAGNOSIS — I252 Old myocardial infarction: Secondary | ICD-10-CM | POA: Diagnosis not present

## 2015-02-25 DIAGNOSIS — R233 Spontaneous ecchymoses: Secondary | ICD-10-CM | POA: Diagnosis not present

## 2015-02-25 DIAGNOSIS — I471 Supraventricular tachycardia: Secondary | ICD-10-CM | POA: Insufficient documentation

## 2015-02-25 DIAGNOSIS — Z880 Allergy status to penicillin: Secondary | ICD-10-CM | POA: Insufficient documentation

## 2015-02-25 DIAGNOSIS — K210 Gastro-esophageal reflux disease with esophagitis: Secondary | ICD-10-CM | POA: Diagnosis not present

## 2015-02-25 DIAGNOSIS — F419 Anxiety disorder, unspecified: Secondary | ICD-10-CM | POA: Insufficient documentation

## 2015-02-25 DIAGNOSIS — I2510 Atherosclerotic heart disease of native coronary artery without angina pectoris: Secondary | ICD-10-CM | POA: Diagnosis not present

## 2015-02-25 DIAGNOSIS — K209 Esophagitis, unspecified: Secondary | ICD-10-CM | POA: Diagnosis not present

## 2015-02-25 DIAGNOSIS — R12 Heartburn: Secondary | ICD-10-CM | POA: Insufficient documentation

## 2015-02-25 DIAGNOSIS — Z7982 Long term (current) use of aspirin: Secondary | ICD-10-CM | POA: Diagnosis not present

## 2015-02-25 DIAGNOSIS — I4891 Unspecified atrial fibrillation: Secondary | ICD-10-CM | POA: Diagnosis not present

## 2015-02-25 DIAGNOSIS — K449 Diaphragmatic hernia without obstruction or gangrene: Secondary | ICD-10-CM | POA: Diagnosis not present

## 2015-02-25 DIAGNOSIS — Z85818 Personal history of malignant neoplasm of other sites of lip, oral cavity, and pharynx: Secondary | ICD-10-CM | POA: Insufficient documentation

## 2015-02-25 DIAGNOSIS — R1310 Dysphagia, unspecified: Secondary | ICD-10-CM | POA: Diagnosis not present

## 2015-02-25 DIAGNOSIS — Z8673 Personal history of transient ischemic attack (TIA), and cerebral infarction without residual deficits: Secondary | ICD-10-CM | POA: Insufficient documentation

## 2015-02-25 DIAGNOSIS — Z885 Allergy status to narcotic agent status: Secondary | ICD-10-CM | POA: Insufficient documentation

## 2015-02-25 DIAGNOSIS — I6521 Occlusion and stenosis of right carotid artery: Secondary | ICD-10-CM | POA: Insufficient documentation

## 2015-02-25 DIAGNOSIS — K5090 Crohn's disease, unspecified, without complications: Secondary | ICD-10-CM | POA: Diagnosis not present

## 2015-02-25 DIAGNOSIS — I10 Essential (primary) hypertension: Secondary | ICD-10-CM | POA: Diagnosis not present

## 2015-02-25 DIAGNOSIS — K219 Gastro-esophageal reflux disease without esophagitis: Secondary | ICD-10-CM | POA: Diagnosis present

## 2015-02-25 HISTORY — PX: ESOPHAGOGASTRODUODENOSCOPY: SHX5428

## 2015-02-25 HISTORY — PX: ESOPHAGEAL DILATION: SHX303

## 2015-02-25 SURGERY — EGD (ESOPHAGOGASTRODUODENOSCOPY)
Anesthesia: Moderate Sedation

## 2015-02-25 MED ORDER — SODIUM CHLORIDE 0.9 % IV SOLN: INTRAVENOUS | Status: DC

## 2015-02-25 MED ORDER — STERILE WATER FOR IRRIGATION IR SOLN
Status: DC | PRN
  Administered 2015-02-25: 10:00:00

## 2015-02-25 MED ORDER — MIDAZOLAM HCL 5 MG/5ML IJ SOLN
INTRAMUSCULAR | Status: AC
  Filled 2015-02-25: qty 10

## 2015-02-25 MED ORDER — MEPERIDINE HCL 50 MG/ML IJ SOLN
INTRAMUSCULAR | Status: DC | PRN
  Administered 2015-02-25: 25 mg via INTRAVENOUS

## 2015-02-25 MED ORDER — BUTAMBEN-TETRACAINE-BENZOCAINE 2-2-14 % EX AERO
INHALATION_SPRAY | CUTANEOUS | Status: DC | PRN
  Administered 2015-02-25: 2 via TOPICAL

## 2015-02-25 MED ORDER — BUTAMBEN-TETRACAINE-BENZOCAINE 2-2-14 % EX AERO
INHALATION_SPRAY | CUTANEOUS | Status: AC
  Filled 2015-02-25: qty 56

## 2015-02-25 MED ORDER — MIDAZOLAM HCL 5 MG/5ML IJ SOLN
INTRAMUSCULAR | Status: DC | PRN
  Administered 2015-02-25: 1 mg via INTRAVENOUS
  Administered 2015-02-25: 2 mg via INTRAVENOUS

## 2015-02-25 MED ORDER — RANITIDINE HCL 150 MG PO TABS: 150.0000 mg | ORAL_TABLET | Freq: Every day | ORAL | Status: DC

## 2015-02-25 MED ORDER — FENTANYL CITRATE (PF) 100 MCG/2ML IJ SOLN
INTRAMUSCULAR | Status: AC
  Filled 2015-02-25: qty 2

## 2015-02-25 NOTE — H&P (Signed)
[name] is an 81 y.o. female.   Chief Complaint: [name]. HPI: Patient is 81 year old Caucasian female with multiple medical problems including chronic GERD who presents with one month history of dysphagia to solids. She points or sternal areas site of bolus obstruction. She's had few episodes of regurgitation for relief. Esophagus has been dilated few times in the past. Most recently in July 2015 and she said it helped a great deal. She has no difficulty with liquids. She also complains of chronic soreness in amount since she's had therapy for tonsillar carcinoma.   Past Medical History  Diagnosis Date  . Essential hypertension, benign   . Crohn's disease   . Spinal stenosis   . PSVT (paroxysmal supraventricular tachycardia)   . History of TIAs   . GERD (gastroesophageal reflux disease)   . Carotid artery disease     RICA 50-69% 12/09  . Myocardial infarction   . COPD (chronic obstructive pulmonary disease)   . Thyroid mass   . Incontinence of urine   . Anxiety   . Arthritis   . Atrial fibrillation     Postoperative - suboptimal Coumadin candidate  . CAD (coronary artery disease), native coronary artery   . Port-a-cath in place 01/22/2012  . 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 Carcinoma of tonsillar pillars (anterior) (posterior)     XRT and resection    Past Surgical History  Procedure Laterality Date  . Left thoracotomy with wedge resection  2008    Left lower lobe  . Right tonsillectomy  2005  . Left thyroidectomy  2006  . Left breast biopsy  2011  . US echocardiography    . Cholecystectomy    . Cardiovascular stress test    . Abdominal hysterectomy    . Thyroidectomy  08/30/2011    Procedure: THYROIDECTOMY;  Surgeon: [name];  [facility]: [facility];  Service: ENT;  Laterality: Right;  . Esophagogastroduodenoscopy (egd) with esophageal dilation N/A 05/06/2013    Procedure:  ESOPHAGOGASTRODUODENOSCOPY (EGD) WITH ESOPHAGEAL DILATION;  Surgeon: Latoya Houston, MD;  [facility];  Service: Endoscopy;  Laterality: N/A;  230  . Esophagogastroduodenoscopy N/A 04/27/2014    Procedure: ESOPHAGOGASTRODUODENOSCOPY (EGD);  Surgeon: Latoya Houston, MD;  [facility];  Service: Endoscopy;  Laterality: N/A;  1230  . Balloon dilation N/A 04/27/2014    Procedure: BALLOON DILATION;  Surgeon: Latoya Houston, MD;  [facility];  Service: Endoscopy;  Laterality: N/A;  Venia Minks dilation N/A 04/27/2014    Procedure: Venia Minks DILATION;  Surgeon: Latoya Houston, MD;  [facility];  Service: Endoscopy;  Laterality: N/A;  . Savory dilation N/A 04/27/2014    Procedure: SAVORY DILATION;  Surgeon: Latoya Houston, MD;  [facility];  Service: Endoscopy;  Laterality: N/A;    Family History  Problem Relation Age of Onset  . Coronary artery disease Father     Died age 91  . Dementia Mother    Social History:  reports that she quit smoking about 21 years ago. Her smoking use included Cigarettes. She has never used smokeless tobacco. She reports that she does not drink alcohol or use illicit drugs.  Allergies:  Allergies  Allergen Reactions  . Codeine Nausea And Vomiting  . Morphine And Related Nausea And Vomiting  . Penicillins Hives and Swelling    Medications Prior to Admission  Medication  Sig Dispense Refill  . albuterol (PROVENTIL) (2.5 MG/3ML) 0.083% nebulizer solution Take 2.5 mg by nebulization 4 (four) times daily.    Marland Kitchen ALPRAZolam (XANAX) 0.25 MG tablet Take 0.25 mg by mouth at bedtime as needed. For sleep.    Marland Kitchen aspirin 81 MG EC tablet 81 mg tablet; oral    . atenolol (TENORMIN) 25 MG tablet Take 1 tablet (25 mg total) by mouth daily. 30 tablet 6  . Calcium & Magnesium Carbonates (MYLANTA PO) Take 15 mLs by mouth as needed.    . calcium carbonate (TUMS - DOSED IN MG ELEMENTAL CALCIUM) 500 MG chewable tablet Chew 1 tablet by  mouth 2 (two) times daily as needed for heartburn.     . fluticasone (FLONASE) 50 MCG/ACT nasal spray Place 2 sprays into the nose daily.      . furosemide (LASIX) 40 MG tablet Take 40 mg by mouth 2 (two) times daily.     Marland Kitchen ipratropium (ATROVENT) 0.02 % nebulizer solution Take 250 mcg by nebulization 4 (four) times daily.    Marland Kitchen levothyroxine (SYNTHROID, LEVOTHROID) 75 MCG tablet Take 75 mcg by mouth every morning.    . lidocaine (LIDODERM) 5 % Place 1 patch onto the skin daily as needed (for pain). Remove & Discard patch within 12 hours or as directed by MD. Pain.    . magnesium hydroxide (MILK OF MAGNESIA) 400 MG/5ML suspension Take 30 mLs by mouth as needed for mild constipation.    . Omega-3 Fatty Acids (FISH OIL PO) Take 1 capsule by mouth daily.    . pantoprazole (PROTONIX) 40 MG tablet Take 1 tablet (40 mg total) by mouth daily. 30 tablet 5  . PENTASA 250 MG CR capsule TAKE 4 CAPSULES 4 TIMES DAILY. 480 capsule 3  . polyethylene glycol (MIRALAX / GLYCOLAX) packet Take 17 g by mouth daily.    . potassium chloride (K-DUR) 10 MEQ tablet Take 1 tablet (10 mEq total) by mouth daily. 90 tablet 1  . PROAIR HFA 108 (90 BASE) MCG/ACT inhaler Inhale 2 puffs into the lungs every 6 (six) hours as needed for wheezing or shortness of breath.       No results found for this or any previous visit (from the past 48 hour(s)). No results found.  ROS  Blood pressure 114/45, pulse 63, temperature 97.3 F (36.3 C), resp. rate 16, SpO2 94 %. Physical Exam  Constitutional: She appears well-developed and well-nourished.  HENT:  Oropharyngeal mucosa is dry  Eyes: Conjunctivae are normal. No scleral icterus.  Neck: No thyromegaly present.  Cardiovascular: Normal rate, regular rhythm and normal heart sounds.   No murmur heard. Respiratory: Effort normal and breath sounds normal.  GI: Soft. She exhibits no distension and no mass. There is tenderness (mild tenderness at RLQ). There is no guarding.   Musculoskeletal: She exhibits no edema.  Neurological: She is alert.  Skin: Skin is warm and dry.     Assessment/Plan Solid food dysphagia in a patient with history of GERD. EGD with ED.  REHMAN,NAJEEB U 02/25/2015, 10:13 AM

## 2015-02-25 NOTE — Discharge Instructions (Signed)
Resume usual medications including aspirin. Take pantoprazole 30 minutes before breakfast and Zantac OTC 150 mg daily at bedtime. No driving for 24 hours. Please call office with progress report in one week   Esophagogastroduodenoscopy Care After Refer to this sheet in the next few weeks. These instructions provide you with information on caring for yourself after your procedure. Your caregiver may also give you more specific instructions. Your treatment has been planned according to current medical practices, but problems sometimes occur. Call your caregiver if you have any problems or questions after your procedure.  HOME CARE INSTRUCTIONS  Do not eat or drink anything until the numbing medicine (local anesthetic) has worn off and your gag reflex has returned. You will know that the local anesthetic has worn off when you can swallow comfortably.  Do not drive for 12 hours after the procedure or as directed by your caregiver.  Only take medicines as directed by your caregiver. SEEK MEDICAL CARE IF:   You cannot stop coughing.  You are not urinating at all or less than usual. SEEK IMMEDIATE MEDICAL CARE IF:  You have difficulty swallowing.  You cannot eat or drink.  You have worsening throat or chest pain.  You have dizziness, lightheadedness, or you faint.  You have nausea or vomiting.  You have chills.  You have a fever.  You have severe abdominal pain.  You have black, tarry, or bloody stools. Document Released: 09/03/2012 Document Reviewed: 09/03/2012 [facility] Patient Information 2015 [place]. This information is not intended to replace advice given to you by your health care provider. Make sure you discuss any questions you have with your health care provider.

## 2015-02-25 NOTE — Op Note (Signed)
EGD PROCEDURE REPORT  PATIENT:  [name]  MR#:  [id number] Birthdate:  05-15-1927, 81 y.o., female Endoscopist:  Dr. Rogene Houston, MD Referred By:  Dr. [name], MD  Procedure Date: 02/25/2015  Procedure:   EGD with ED  Indications:  Patient is 81 year old Caucasian female with multiple medical problems including GERD who presents with solid food dysphagia for 1 month duration. She points to her lower sternum area site of bolus obstruction. Her esophagus was last dilated in July 2015 and provider relief until one month ago. No structural abnormality was identified on her last EGD. She is taking preventive resolved at night but having intermittent heartburn during the daytime on some days..            Informed Consent:  The risks, benefits, alternatives & imponderables which include, but are not limited to, bleeding, infection, perforation, drug reaction and potential missed lesion have been reviewed.  The potential for biopsy, lesion removal, esophageal dilation, etc. have also been discussed.  Questions have been answered.  All parties agreeable.  Please see history & physical in medical record for more information.  Medications:  Fentanyl 25 g IV. Versed 3 mg IV Cetacaine spray topically for oropharyngeal anesthesia  Description of procedure:  The endoscope was introduced through the mouth and advanced to the second portion of the duodenum without difficulty or limitations. The mucosal surfaces were surveyed very carefully during advancement of the scope and upon withdrawal.  Findings:  Esophagus:  Mucosa of the esophagus was normal. No ring or stricture was noted. Some resistance noted on passing the scope across distal segment. Focal edema and erythema noted at GE junction on the gastric side GEJ:  41 cm Hiatus:  44 cm Stomach:  Stomach was empty and distended very well with insufflation. Folds in the proximal stomach were normal. Examination of mucosa at gastric body and antrum  revealed few punctate petechiae burn washing underlying mucosa was normal. Pyloric channel was patent. Angularis fundus and cardia were unremarkable. Duodenum:  Normal bulbar and post bulbar mucosa.  Therapeutic/Diagnostic Maneuvers Performed:   Esophagus was dilated by passing 56 [place] Maloney dilator to full insertion. As the dilator was withdrawn endoscope was passed again and no mucosal disruption noted.  Complications:  None  Impression: No evidence of esophageal ring or stricture. Mild changes of reflux esophagitis limited to GE junction. Small sliding hiatal hernia. Punctate gastric petechia without underlying lesions. Esophagus dilated by passing 56 [place] Maloney dilator but no mucosal disruption induced.  Comment; Dysphagia felt to be due to esophageal motility disorder. If she does not respond to dilation will bring her back for barium pill study.  Recommendations:  Take pantoprazole before breakfast instead of at bedtime. Zantac OTC 150 mg by mouth daily at bedtime. Patient will call with progress report in one week.  REHMAN,NAJEEB U  02/25/2015  10:37 AM  CC: Dr. [name], MD & Dr. Rayne Du ref. provider found

## 2015-03-01 ENCOUNTER — Encounter (HOSPITAL_COMMUNITY): Payer: Self-pay | Admitting: Internal Medicine

## 2015-03-02 ENCOUNTER — Ambulatory Visit (HOSPITAL_COMMUNITY): Payer: Medicare Other | Admitting: Hematology & Oncology

## 2015-03-09 ENCOUNTER — Encounter (HOSPITAL_COMMUNITY): Payer: Medicare Other

## 2015-03-09 ENCOUNTER — Ambulatory Visit (HOSPITAL_COMMUNITY): Payer: Medicare Other | Admitting: Hematology & Oncology

## 2015-03-10 ENCOUNTER — Encounter (INDEPENDENT_AMBULATORY_CARE_PROVIDER_SITE_OTHER): Payer: Self-pay

## 2015-03-13 NOTE — Progress Notes (Signed)
This encounter was created in error - please disregard.

## 2015-03-15 ENCOUNTER — Encounter (HOSPITAL_COMMUNITY): Payer: Medicare Other | Attending: Internal Medicine | Admitting: Hematology & Oncology

## 2015-03-15 ENCOUNTER — Ambulatory Visit (HOSPITAL_COMMUNITY): Payer: Medicare Other | Admitting: Hematology & Oncology

## 2015-03-15 ENCOUNTER — Encounter (HOSPITAL_COMMUNITY): Payer: Medicare Other

## 2015-03-15 ENCOUNTER — Encounter (HOSPITAL_COMMUNITY): Payer: Self-pay | Admitting: Hematology & Oncology

## 2015-03-15 VITALS — BP 142/74 | HR 64 | Temp 98.0°F | Resp 18 | Wt 196.3 lb

## 2015-03-15 DIAGNOSIS — C7800 Secondary malignant neoplasm of unspecified lung: Secondary | ICD-10-CM | POA: Diagnosis not present

## 2015-03-15 DIAGNOSIS — D509 Iron deficiency anemia, unspecified: Secondary | ICD-10-CM | POA: Insufficient documentation

## 2015-03-15 DIAGNOSIS — M810 Age-related osteoporosis without current pathological fracture: Secondary | ICD-10-CM

## 2015-03-15 DIAGNOSIS — M545 Low back pain: Secondary | ICD-10-CM

## 2015-03-15 DIAGNOSIS — C760 Malignant neoplasm of head, face and neck: Secondary | ICD-10-CM | POA: Diagnosis not present

## 2015-03-15 DIAGNOSIS — C3490 Malignant neoplasm of unspecified part of unspecified bronchus or lung: Secondary | ICD-10-CM

## 2015-03-15 DIAGNOSIS — E890 Postprocedural hypothyroidism: Secondary | ICD-10-CM | POA: Insufficient documentation

## 2015-03-15 DIAGNOSIS — C3401 Malignant neoplasm of right main bronchus: Secondary | ICD-10-CM | POA: Insufficient documentation

## 2015-03-15 DIAGNOSIS — E611 Iron deficiency: Secondary | ICD-10-CM | POA: Insufficient documentation

## 2015-03-15 DIAGNOSIS — C091 Malignant neoplasm of tonsillar pillar (anterior) (posterior): Secondary | ICD-10-CM

## 2015-03-15 MED ORDER — HEPARIN SOD (PORK) LOCK FLUSH 100 UNIT/ML IV SOLN
INTRAVENOUS | Status: AC
  Filled 2015-03-15: qty 5

## 2015-03-15 MED ORDER — SODIUM CHLORIDE 0.9 % IJ SOLN
10.0000 mL | INTRAMUSCULAR | Status: DC | PRN
  Administered 2015-03-15: 10 mL via INTRAVENOUS
  Filled 2015-03-15: qty 10

## 2015-03-15 MED ORDER — HEPARIN SOD (PORK) LOCK FLUSH 100 UNIT/ML IV SOLN
500.0000 [IU] | Freq: Once | INTRAVENOUS | Status: AC
  Administered 2015-03-15: 500 [IU] via INTRAVENOUS

## 2015-03-15 NOTE — Progress Notes (Signed)
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 female returns for  regular visit for followup of metastatic head and neck cancer to lungs, S/P resection, SBRT, and systemic chemotherapy.    She lives alone and right next door to a nursing home. She has help come into her home most days of the week. She drives herself around, she is just unable to walk more than a few steps without balance issues. She also gets cramps in her legs when she walks.   Her primary care doctor is Dr. Willey Blade. She last saw him last Tuesday, 6/7. She had a blood test with him and the results were to be faxed here.  Her major complaints revolve around LBP/spinal stenosis. She also states that she has multiple thoracic compression fractures that cause her pain.  She has been offered a referral to a pain clinic but feels she cannot travel to [place].  She has osteoporosis. She has Crohn's disease. She had a CT of her pelvis last month with gastrologist due to bloody stool.  Her last mammogram was done in February. She has some pain in her right neck, possibly from previous radiation treatment. She would like her portacath taken out, though her doctor said she may need it in the future and to leave it in.    Cancer of tonsillar pillars (anterior) (posterior)    Definitive Surgery 2005 resection of primary tonsillar cancer    Definitive Surgery 2006 wedge resection of left upper lobe x 2 by Dr. Arlyce Dice   05/09/2005 - 08/29/2005 Chemotherapy Carboplatin/Taxol/Erbitux x 6 cycles    Definitive Surgery 06/2007 wedge resection   03/12/2008 - 03/22/2008 Radiation Therapy SBRT 45 Gy x 5 fractions to LLL lesion and 32.5 Gy x 5 fractions to additional LLL   09/20/2008 - 09/20/2008 Radiation Therapy SBRT 26 Gy in 1 fraction to left  lung fissue lesion   01/17/2010 - 01/17/2010 Radiation Therapy SBRT 22 Gy in 1 fraction of RLL lesion     Past Medical History  Diagnosis Date  . Essential hypertension, benign   . Crohn's disease   . Spinal stenosis   . PSVT (paroxysmal supraventricular tachycardia)   . History of TIAs   . GERD (gastroesophageal reflux disease)   . Carotid artery disease     RICA 50-69% 12/09  . Myocardial infarction   . COPD (chronic obstructive pulmonary disease)   . Thyroid mass   . Incontinence of urine   . Anxiety   . Arthritis   . Atrial fibrillation     Postoperative - suboptimal Coumadin candidate  . CAD (coronary artery disease), native coronary artery   . Port-a-cath in place 01/22/2012  . 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 Carcinoma of tonsillar pillars (anterior) (posterior)     XRT and resection    has Carotid artery disease; Essential hypertension, benign; Coronary atherosclerosis of native coronary artery; GERD (gastroesophageal reflux disease); COPD (chronic obstructive pulmonary disease); Mixed hyperlipidemia; Port-a-cath in place; Dysphagia; Bilateral leg edema; COPD exacerbation; Nausea with vomiting; Diarrhea; Oral pharyngeal candidiasis; Lung cancer; Cancer of tonsillar pillars (anterior) (posterior); Anemia, iron deficiency; and Iron deficiency on her problem list.     is allergic to codeine; morphine and related; and penicillins.  We administered heparin lock flush  and sodium chloride.  Past Surgical History  Procedure Laterality Date  . Left thoracotomy with wedge resection  2008    Left lower lobe  . Right tonsillectomy  2005  . Left thyroidectomy  2006  . Left breast biopsy  2011  . US echocardiography    . Cholecystectomy    . Cardiovascular stress test    . Abdominal hysterectomy    . Thyroidectomy  08/30/2011    Procedure: THYROIDECTOMY;  Surgeon: Cecil Cranker;  Location: [facility];  Service: ENT;  Laterality:  Right;  . Esophagogastroduodenoscopy (egd) with esophageal dilation N/A 05/06/2013    Procedure: ESOPHAGOGASTRODUODENOSCOPY (EGD) WITH ESOPHAGEAL DILATION;  Surgeon: Rogene Houston, MD;  [facility];  Service: Endoscopy;  Laterality: N/A;  230  . Esophagogastroduodenoscopy N/A 04/27/2014    Procedure: ESOPHAGOGASTRODUODENOSCOPY (EGD);  Surgeon: Rogene Houston, MD;  [facility];  Service: Endoscopy;  Laterality: N/A;  1230  . Balloon dilation N/A 04/27/2014    Procedure: BALLOON DILATION;  Surgeon: Rogene Houston, MD;  [facility];  Service: Endoscopy;  Laterality: N/A;  Venia Minks dilation N/A 04/27/2014    Procedure: Venia Minks DILATION;  Surgeon: Rogene Houston, MD;  [facility];  Service: Endoscopy;  Laterality: N/A;  . Savory dilation N/A 04/27/2014    Procedure: SAVORY DILATION;  Surgeon: Rogene Houston, MD;  [facility];  Service: Endoscopy;  Laterality: N/A;  . Esophagogastroduodenoscopy N/A 02/25/2015    Procedure: ESOPHAGOGASTRODUODENOSCOPY (EGD);  Surgeon: Rogene Houston, MD;  [facility];  Service: Endoscopy;  Laterality: N/A;  1155  . Esophageal dilation N/A 02/25/2015    Procedure: ESOPHAGEAL DILATION;  Surgeon: Rogene Houston, MD;  [facility];  Service: Endoscopy;  Laterality: N/A;    Denies any headaches, dizziness, double vision, fevers, chills, night sweats, nausea, vomiting, diarrhea, constipation, chest pain, heart palpitations, shortness of breath, blood in stool, black tarry stool, urinary pain, urinary burning, urinary frequency, hematuria. Positive for hearing loss, neck pain, and back pain.  PHYSICAL EXAMINATION ECOG PERFORMANCE STATUS: 3 - Symptomatic, >50% confined to bed  Filed Vitals:   03/15/15 1319  BP: 142/74  Pulse: 64  Temp: 98 F (36.7 C)  Resp: 18    GENERAL:alert, no distress, well nourished, well developed, comfortable, cooperative and smiling SKIN: skin color,  texture, turgor are normal, no rashes or significant lesions HEAD: Normocephalic, No masses, lesions, tenderness or abnormalities EYES: normal, PERRLA, EOMI, Conjunctiva are pink and non-injected EARS: External ears normal OROPHARYNX:lips, buccal mucosa, and tongue normal  NECK: supple, no adenopathy, no stridor, non-tender, trachea midline Right neck tension LYMPH:  no palpable lymphadenopathy BREAST:not examined LUNGS: clear to auscultation, diminished breaths sounds HEART: regular rate & rhythm, no murmurs and no gallops ABDOMEN:abdomen soft, non-tender, obese and normal bowel sounds BACK: Back symmetric, no curvature. EXTREMITIES:less then 2 second capillary refill, no skin discoloration, no cyanosis, positive findings:  edema B/L 2+ pitting edema  NEURO: alert & oriented x 3 with fluent speech, no focal motor/sensory deficits, in wheelchair   LABORATORY DATA: CBC    Component Value Date/Time   WBC 7.2 02/15/2015 1545   RBC 4.47 02/15/2015 1545   HGB 13.2 02/15/2015 1545   HCT 40.3 02/15/2015 1545   PLT 287 02/15/2015 1545   MCV 90.2 02/15/2015 1545   MCH 29.5 02/15/2015 1545   MCHC 32.8 02/15/2015 1545   RDW 15.0 02/15/2015 1545   LYMPHSABS 1.6 02/15/2015 1545  MONOABS 0.6 02/15/2015 1545   EOSABS 0.1 02/15/2015 1545   BASOSABS 0.0 02/15/2015 1545      Chemistry      Component Value Date/Time   NA 139 09/08/2014 1410   K 3.9 09/08/2014 1410   CL 95* 09/08/2014 1410   CO2 32 09/08/2014 1410   BUN 17 09/08/2014 1410   CREATININE 0.79 02/15/2015 1545   CREATININE 0.90 09/08/2014 1410      Component Value Date/Time   CALCIUM 8.9 09/08/2014 1410   ALKPHOS 87 09/08/2014 1410   AST 14 09/08/2014 1410   ALT 7 09/08/2014 1410   BILITOT 0.3 09/08/2014 1410        ASSESSMENT:  1. 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  2. H/O of tonsillar cancer S/P resection and adjuvant radiation therapy in 2005 by Dr. Nila Nephew. 3. Crohns disease, followed by Dr. Laural Golden 4.Low Back Pain, chronic  THERAPY PLAN:   We discussed the timeframe of her disease recurrence, and I advised her we will repeat CT imaging of the chest and she will then need no further imaging per guidelines. She finds it hard to believe it has been 5 years.  In regards to her back pain I advised her to continue to talk to Dr. Willey Blade about options. We can look and see if there is a pain clinic in [place] as she feels able to get to [facility] easily. I am happy to refer her if there is one.  We will formally see her back in 6 months with a repeat physical exam. At which time we can consider moving her visits out to yearly. I will keep her apprised of the results of her CT imaging when it is available.  We will request her most recent laboratory studies from Dr. Ria Comment office.  All questions were answered. The patient knows to call the clinic with any problems, questions or concerns. We can certainly see the patient much sooner if necessary.  This document serves as a record of services personally performed by Ancil Linsey, MD. It was created on her behalf by Arlyce Harman, a trained medical scribe. The creation of this record is based on the scribe's personal observations and the provider's statements to them. This document has been checked and approved by the attending provider.  I have reviewed the above documentation for accuracy and completeness, and I agree with the above.   Molli Hazard, MD

## 2015-03-15 NOTE — Patient Instructions (Signed)
..  [place] at [facility] Discharge Instructions  RECOMMENDATIONS MADE BY THE CONSULTANT AND ANY TEST RESULTS WILL BE SENT TO YOUR REFERRING PHYSICIAN.  You will have a chest CT in July and return to the [facility] for follow up in 6 months.  We will request your labs from Dr. Willey Blade.    Thank you for choosing [place] at [facility] to provide your oncology and hematology care.  To afford each patient quality time with our provider, please arrive at least 15 minutes before your scheduled appointment time.    You need to re-schedule your appointment should you arrive 10 or more minutes late.  We strive to give you quality time with our providers, and arriving late affects you and other patients whose appointments are after yours.  Also, if you no show three or more times for appointments you may be dismissed from the clinic at the providers discretion.     Again, thank you for choosing [facility].  Our hope is that these requests will decrease the amount of time that you wait before being seen by our physicians.       _____________________________________________________________  Should you have questions after your visit to [facility], please contact our office at [phone] between the hours of 8:30 a.m. and 4:30 p.m.  Voicemails left after 4:30 p.m. will not be returned until the following business day.  For prescription refill requests, have your pharmacy contact our office.

## 2015-03-15 NOTE — Progress Notes (Signed)
Please see doctors encounter for more information 

## 2015-03-16 ENCOUNTER — Encounter (HOSPITAL_COMMUNITY): Payer: Medicare Other

## 2015-03-17 ENCOUNTER — Telehealth (INDEPENDENT_AMBULATORY_CARE_PROVIDER_SITE_OTHER): Payer: Self-pay | Admitting: *Deleted

## 2015-03-17 NOTE — Telephone Encounter (Signed)
Stool cards x 3 left at front.

## 2015-03-17 NOTE — Telephone Encounter (Signed)
When she goes to the bathroom, she still smells blood. Would like to know if there is a blood test that can be done to check for this? Her return phone number is [phone]. She also would like to know if Terri could give her cards to check for blood.

## 2015-03-23 ENCOUNTER — Other Ambulatory Visit: Payer: Self-pay | Admitting: Cardiology

## 2015-03-24 ENCOUNTER — Telehealth (INDEPENDENT_AMBULATORY_CARE_PROVIDER_SITE_OTHER): Payer: Self-pay | Admitting: *Deleted

## 2015-03-24 NOTE — Telephone Encounter (Signed)
   Diagnosis:    Result(s)   Card 1: Positive             Card 2: Positive   Card 3: Positive    Completed by: Philmore Lepore,LPN   HEMOCCULT SENSA DEVELOPER: LOT#:  [id number] EXPIRATION DATE: 9-17   HEMOCCULT SENSA CARD:  LOT#:  [id number] EXPIRATION DATE: 07/18   CARD CONTROL RESULTS:  POSITIVE: Positive NEGATIVE: Negative    ADDITIONAL COMMENTS: Forwarded to [place] for review. Patient has not been called with results.

## 2015-03-25 NOTE — Telephone Encounter (Signed)
I have talked with patient and discussed with Dr. Laural Golden. He haw information and will make a decision

## 2015-03-30 ENCOUNTER — Other Ambulatory Visit (INDEPENDENT_AMBULATORY_CARE_PROVIDER_SITE_OTHER): Payer: Self-pay | Admitting: Internal Medicine

## 2015-03-30 ENCOUNTER — Telehealth (INDEPENDENT_AMBULATORY_CARE_PROVIDER_SITE_OTHER): Payer: Self-pay | Admitting: *Deleted

## 2015-03-30 DIAGNOSIS — K562 Volvulus: Secondary | ICD-10-CM

## 2015-03-30 DIAGNOSIS — D509 Iron deficiency anemia, unspecified: Secondary | ICD-10-CM

## 2015-03-30 DIAGNOSIS — R1030 Lower abdominal pain, unspecified: Secondary | ICD-10-CM

## 2015-03-30 DIAGNOSIS — K50911 Crohn's disease, unspecified, with rectal bleeding: Secondary | ICD-10-CM

## 2015-03-30 DIAGNOSIS — K625 Hemorrhage of anus and rectum: Secondary | ICD-10-CM

## 2015-03-30 DIAGNOSIS — Z85118 Personal history of other malignant neoplasm of bronchus and lung: Secondary | ICD-10-CM

## 2015-03-30 NOTE — Telephone Encounter (Signed)
I have talked with patient. She was passing bright red blood. She said she had diarrhea. She had black stools. She said she could see [facility]. She had over eight stools yesterday. All of stools were loose except for one which was hard. .    Advised her to go to the ED for her rectal bleeding.  Will set her up for a virtual colonoscopy

## 2015-03-30 NOTE — Telephone Encounter (Signed)
Patient states had crohn's attack yesterday, diarrhea 8 plus times with visible blood each time and black like tar, this was from about 130 to 400, about every 15 minutes and had excruciatung pain during this time, feeling better today, hasn't been to the bathroom yet for BM, beginning to hurt again but taking Pentasa which helps with pain, please call

## 2015-04-06 ENCOUNTER — Encounter (HOSPITAL_COMMUNITY): Payer: Medicare Other

## 2015-04-06 NOTE — Telephone Encounter (Signed)
Virtual TCS sch'd 04/11/15 at 740 am, patient needs to go to [facility] @ [address] to get instructions and prep. I did talk to Ms Martinique this morning and she is going to see if she can get someone to take and call me back. She is aware of what she needs to do.

## 2015-04-11 ENCOUNTER — Other Ambulatory Visit: Payer: Medicare Other

## 2015-04-15 ENCOUNTER — Ambulatory Visit (HOSPITAL_COMMUNITY)
Admission: RE | Admit: 2015-04-15 | Discharge: 2015-04-15 | Disposition: A | Discharge status: Home / Self Care | Payer: Medicare Other | Source: Ambulatory Visit | Attending: Hematology & Oncology | Admitting: Hematology & Oncology

## 2015-04-15 DIAGNOSIS — C7800 Secondary malignant neoplasm of unspecified lung: Secondary | ICD-10-CM | POA: Insufficient documentation

## 2015-04-15 DIAGNOSIS — J90 Pleural effusion, not elsewhere classified: Secondary | ICD-10-CM | POA: Insufficient documentation

## 2015-04-15 DIAGNOSIS — Z08 Encounter for follow-up examination after completed treatment for malignant neoplasm: Secondary | ICD-10-CM | POA: Diagnosis present

## 2015-04-15 DIAGNOSIS — C091 Malignant neoplasm of tonsillar pillar (anterior) (posterior): Secondary | ICD-10-CM | POA: Insufficient documentation

## 2015-04-15 DIAGNOSIS — E890 Postprocedural hypothyroidism: Secondary | ICD-10-CM | POA: Insufficient documentation

## 2015-04-15 DIAGNOSIS — C3490 Malignant neoplasm of unspecified part of unspecified bronchus or lung: Secondary | ICD-10-CM

## 2015-04-15 DIAGNOSIS — Z9221 Personal history of antineoplastic chemotherapy: Secondary | ICD-10-CM | POA: Diagnosis not present

## 2015-04-15 MED ORDER — IOHEXOL 300 MG/ML  SOLN
75.0000 mL | Freq: Once | INTRAMUSCULAR | Status: AC | PRN
  Administered 2015-04-15: 75 mL via INTRAVENOUS

## 2015-04-19 ENCOUNTER — Ambulatory Visit
Admission: RE | Admit: 2015-04-19 | Discharge: 2015-04-19 | Disposition: A | Discharge status: Home / Self Care | Payer: Medicare Other | Source: Ambulatory Visit | Attending: Internal Medicine | Admitting: Internal Medicine

## 2015-04-19 DIAGNOSIS — Z85118 Personal history of other malignant neoplasm of bronchus and lung: Secondary | ICD-10-CM

## 2015-04-19 DIAGNOSIS — K50911 Crohn's disease, unspecified, with rectal bleeding: Secondary | ICD-10-CM

## 2015-04-19 DIAGNOSIS — K625 Hemorrhage of anus and rectum: Secondary | ICD-10-CM

## 2015-04-19 DIAGNOSIS — D509 Iron deficiency anemia, unspecified: Secondary | ICD-10-CM

## 2015-04-19 DIAGNOSIS — R1030 Lower abdominal pain, unspecified: Secondary | ICD-10-CM

## 2015-04-19 DIAGNOSIS — K562 Volvulus: Secondary | ICD-10-CM

## 2015-04-22 ENCOUNTER — Telehealth (INDEPENDENT_AMBULATORY_CARE_PROVIDER_SITE_OTHER): Payer: Self-pay | Admitting: Internal Medicine

## 2015-04-22 ENCOUNTER — Other Ambulatory Visit (INDEPENDENT_AMBULATORY_CARE_PROVIDER_SITE_OTHER): Payer: Self-pay | Admitting: *Deleted

## 2015-04-22 ENCOUNTER — Telehealth (INDEPENDENT_AMBULATORY_CARE_PROVIDER_SITE_OTHER): Payer: Self-pay | Admitting: *Deleted

## 2015-04-22 DIAGNOSIS — Z860100 Personal history of colon polyps, unspecified: Secondary | ICD-10-CM

## 2015-04-22 NOTE — Telephone Encounter (Signed)
TCS sch'd 05/06/15, patient aware

## 2015-04-22 NOTE — Telephone Encounter (Signed)
Patient needs trilyte 

## 2015-04-22 NOTE — Telephone Encounter (Signed)
Results of virtual colonoscopy given to patient. I discussed with Dr. Laural Golden. Patient has a polyp.   Ann, Please schedule a colonoscopy.

## 2015-04-26 ENCOUNTER — Encounter (HOSPITAL_COMMUNITY): Payer: Medicare Other

## 2015-04-26 ENCOUNTER — Telehealth (HOSPITAL_COMMUNITY): Payer: Self-pay

## 2015-04-26 MED ORDER — PEG 3350-KCL-NA BICARB-NACL 420 G PO SOLR: 4000.0000 mL | Freq: Once | ORAL | Status: DC

## 2015-04-26 NOTE — Telephone Encounter (Signed)
Per Dr. Whitney Muse, patient notified that CT showed no evidence of recurrent or metastatic disease in the chest.

## 2015-04-26 NOTE — Telephone Encounter (Signed)
-----   Message from Louis Meckel sent at 04/26/2015  8:46 AM EDT ----- Regarding: results Patient wants to get resutls from CT scan.  Thanks

## 2015-04-28 ENCOUNTER — Other Ambulatory Visit (HOSPITAL_COMMUNITY): Payer: Medicare Other

## 2015-05-06 ENCOUNTER — Ambulatory Visit (HOSPITAL_COMMUNITY)
Admission: RE | Admit: 2015-05-06 | Discharge: 2015-05-06 | Disposition: A | Discharge status: Home / Self Care | Payer: Medicare Other | Source: Ambulatory Visit | Attending: Internal Medicine | Admitting: Internal Medicine

## 2015-05-06 ENCOUNTER — Encounter (HOSPITAL_COMMUNITY): Payer: Self-pay | Admitting: *Deleted

## 2015-05-06 ENCOUNTER — Encounter (HOSPITAL_COMMUNITY)
Admission: RE | Disposition: A | Discharge status: Home / Self Care | Payer: Self-pay | Source: Ambulatory Visit | Attending: Internal Medicine

## 2015-05-06 DIAGNOSIS — K5090 Crohn's disease, unspecified, without complications: Secondary | ICD-10-CM | POA: Insufficient documentation

## 2015-05-06 DIAGNOSIS — Z86010 Personal history of colonic polyps: Secondary | ICD-10-CM

## 2015-05-06 DIAGNOSIS — D128 Benign neoplasm of rectum: Secondary | ICD-10-CM | POA: Diagnosis not present

## 2015-05-06 DIAGNOSIS — J449 Chronic obstructive pulmonary disease, unspecified: Secondary | ICD-10-CM | POA: Diagnosis not present

## 2015-05-06 DIAGNOSIS — F419 Anxiety disorder, unspecified: Secondary | ICD-10-CM | POA: Diagnosis not present

## 2015-05-06 DIAGNOSIS — R933 Abnormal findings on diagnostic imaging of other parts of digestive tract: Secondary | ICD-10-CM

## 2015-05-06 DIAGNOSIS — K5730 Diverticulosis of large intestine without perforation or abscess without bleeding: Secondary | ICD-10-CM

## 2015-05-06 DIAGNOSIS — Z87891 Personal history of nicotine dependence: Secondary | ICD-10-CM | POA: Diagnosis not present

## 2015-05-06 DIAGNOSIS — I10 Essential (primary) hypertension: Secondary | ICD-10-CM | POA: Diagnosis not present

## 2015-05-06 DIAGNOSIS — K648 Other hemorrhoids: Secondary | ICD-10-CM | POA: Diagnosis not present

## 2015-05-06 DIAGNOSIS — Z79899 Other long term (current) drug therapy: Secondary | ICD-10-CM | POA: Insufficient documentation

## 2015-05-06 DIAGNOSIS — K921 Melena: Secondary | ICD-10-CM | POA: Diagnosis not present

## 2015-05-06 DIAGNOSIS — K625 Hemorrhage of anus and rectum: Secondary | ICD-10-CM

## 2015-05-06 HISTORY — PX: COLONOSCOPY: SHX5424

## 2015-05-06 SURGERY — COLONOSCOPY
Anesthesia: Moderate Sedation

## 2015-05-06 MED ORDER — FENTANYL CITRATE (PF) 100 MCG/2ML IJ SOLN
INTRAMUSCULAR | Status: DC | PRN
  Administered 2015-05-06 (×3): 25 ug via INTRAVENOUS

## 2015-05-06 MED ORDER — MEPERIDINE HCL 50 MG/ML IJ SOLN
INTRAMUSCULAR | Status: AC
  Filled 2015-05-06: qty 1

## 2015-05-06 MED ORDER — MIDAZOLAM HCL 5 MG/5ML IJ SOLN
INTRAMUSCULAR | Status: AC
  Filled 2015-05-06: qty 10

## 2015-05-06 MED ORDER — MIDAZOLAM HCL 5 MG/5ML IJ SOLN
INTRAMUSCULAR | Status: DC | PRN
  Administered 2015-05-06 (×3): 1 mg via INTRAVENOUS
  Administered 2015-05-06: 2 mg via INTRAVENOUS

## 2015-05-06 MED ORDER — STERILE WATER FOR IRRIGATION IR SOLN
Status: DC | PRN
  Administered 2015-05-06: 14:00:00

## 2015-05-06 MED ORDER — SODIUM CHLORIDE 0.9 % IV SOLN
INTRAVENOUS | Status: DC
  Administered 2015-05-06: 14:00:00 via INTRAVENOUS

## 2015-05-06 MED ORDER — FENTANYL CITRATE (PF) 100 MCG/2ML IJ SOLN
INTRAMUSCULAR | Status: AC
  Filled 2015-05-06: qty 2

## 2015-05-06 NOTE — Op Note (Signed)
COLONOSCOPY PROCEDURE REPORT  PATIENT:  [name]  MR#:  [id number] Birthdate:  1927/07/03, 81 y.o., female Endoscopist:  Dr. Rogene Houston, MD  Procedure Date: 05/06/2015  Procedure:   Colonoscopy  Indications:   Patient is 82 old Caucasian female who has intermittent hematochezia. Last colonoscopy  Few years ago was incomplete. Therefore virtual colonoscopy was obtained and it suggests polyp in descending colon.  Informed Consent:  The procedure and risks were reviewed with the patient and informed consent was obtained.  Medications:  Fentanyl 75 g IV Versed  5 mg IV  Description of procedure:  After a digital rectal exam was performed, that colonoscope was advanced from the anus through the rectum and colon to the area of the cecum. The cecum was deeply not  intubated. These structures were well-seen and photographed for the record. From the level of the cecum and ileocecal valve, the scope was slowly and cautiously withdrawn. The mucosal surfaces were carefully surveyed utilizing scope tip to flexion to facilitate fold flattening as needed. The scope was pulled down into the rectum where a thorough exam including retroflexion was performed.  Findings:   Prep satisfactory. Examination performed to cecum with cecum not deeply intubated. Please note this area is normal on were chilled colonoscopy  multiple diverticula noted at sigmoid colon which is tortuous and  noncompliant.   Small rectal polyp was cold snared.   Hemorrhoids noted above the dentate line.  Therapeutic/Diagnostic Maneuvers Performed:  See above  Complications:   none  Cecal Withdrawal Time:   15 minutes  Impression:   Examination performed to cecum but was not deeply intubated.  This area appeared normal on virtual colonoscopy.  No polyp found in descending colon.  Multiple diverticula at sigmoid colon.  Small rectal polyp was cold snared.  Internal hemorrhoids  Recommendations:  Standard instructions  given. High fiber diet. I will contact patient with biopsy results and further recommendations.  REHMAN,NAJEEB U  05/06/2015 3:56 PM  CC: Dr. [name], MD & Dr. Rayne Du ref. provider found

## 2015-05-06 NOTE — H&P (Signed)
[name] is an 81 y.o. female.   Chief Complaint:  Patient is here for colonoscopy. HPI:  Patient is 81 year old Caucasian female with history of ileal Crohn's disease was had intermittent hematochezia and also heme positive stool. Patient's colonoscopy few years ago was incomplete. Her evaluation was therefore  virtualcolonoscopy which  reveals 9 x 13 mm polyp in descending colon. She is therefore undergoing colonoscopy.  she will undergo colonoscopy with ultra Slim scope hoping to complete examination or at least get to splenic flexure.  Past Medical History  Diagnosis Date  . Essential hypertension, benign   . Crohn's disease   . Spinal stenosis   . PSVT (paroxysmal supraventricular tachycardia)   . History of TIAs   . GERD (gastroesophageal reflux disease)   . Carotid artery disease     RICA 50-69% 12/09  . Myocardial infarction   . COPD (chronic obstructive pulmonary disease)   . Thyroid mass   . Incontinence of urine   . Anxiety   . Arthritis   . Atrial fibrillation     Postoperative - suboptimal Coumadin candidate  . CAD (coronary artery disease), native coronary artery   . Port-a-cath in place 01/22/2012  . 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 Carcinoma of tonsillar pillars (anterior) (posterior)     XRT and resection    Past Surgical History  Procedure Laterality Date  . Left thoracotomy with wedge resection  2008    Left lower lobe  . Right tonsillectomy  2005  . Left thyroidectomy  2006  . Left breast biopsy  2011  . US echocardiography    . Cholecystectomy    . Cardiovascular stress test    . Abdominal hysterectomy    . Thyroidectomy  08/30/2011    Procedure: THYROIDECTOMY;  Surgeon: Cecil Cranker;  Location: [facility] OR;  Service: ENT;  Laterality: Right;  . Esophagogastroduodenoscopy (egd) with esophageal dilation N/A 05/06/2013    Procedure: ESOPHAGOGASTRODUODENOSCOPY (EGD) WITH ESOPHAGEAL DILATION;  Surgeon:  Rogene Houston, MD;  Location: [facility];  Service: Endoscopy;  Laterality: N/A;  230  . Esophagogastroduodenoscopy N/A 04/27/2014    Procedure: ESOPHAGOGASTRODUODENOSCOPY (EGD);  Surgeon: Rogene Houston, MD;  Location: [facility];  Service: Endoscopy;  Laterality: N/A;  1230  . Balloon dilation N/A 04/27/2014    Procedure: BALLOON DILATION;  Surgeon: Rogene Houston, MD;  Location: [facility];  Service: Endoscopy;  Laterality: N/A;  Venia Minks dilation N/A 04/27/2014    Procedure: Venia Minks DILATION;  Surgeon: Rogene Houston, MD;  Location: [facility];  Service: Endoscopy;  Laterality: N/A;  . Savory dilation N/A 04/27/2014    Procedure: SAVORY DILATION;  Surgeon: Rogene Houston, MD;  Location: [facility];  Service: Endoscopy;  Laterality: N/A;  . Esophagogastroduodenoscopy N/A 02/25/2015    Procedure: ESOPHAGOGASTRODUODENOSCOPY (EGD);  Surgeon: Rogene Houston, MD;  Location: [facility];  Service: Endoscopy;  Laterality: N/A;  1155  . Esophageal dilation N/A 02/25/2015    Procedure: ESOPHAGEAL DILATION;  Surgeon: Rogene Houston, MD;  Location: [facility];  Service: Endoscopy;  Laterality: N/A;    Family History  Problem Relation Age of Onset  . Coronary artery disease Father     Died age 13  . Dementia Mother    Social History:  reports that she quit smoking about 21 years ago. Her smoking use included Cigarettes. She has never used smokeless tobacco. She reports  that she does not drink alcohol or use illicit drugs.  Allergies:  Allergies  Allergen Reactions  . Codeine Nausea And Vomiting  . Morphine And Related Nausea And Vomiting  . Penicillins Hives and Swelling    Medications Prior to Admission  Medication Sig Dispense Refill  . albuterol (PROVENTIL) (2.5 MG/3ML) 0.083% nebulizer solution Take 2.5 mg by nebulization 4 (four) times daily.    Marland Kitchen ALPRAZolam (XANAX) 0.25 MG tablet Take 0.25 mg by mouth at bedtime as needed. For sleep.    Marland Kitchen aspirin 81 MG EC tablet  81 mg tablet; oral    . atenolol (TENORMIN) 25 MG tablet TAKE (1) TABLET BY MOUTH ONCE DAILY. 30 tablet 6  . Calcium & Magnesium Carbonates (MYLANTA PO) Take 15 mLs by mouth as needed (acid reflux).     . calcium carbonate (TUMS - DOSED IN MG ELEMENTAL CALCIUM) 500 MG chewable tablet Chew 1 tablet by mouth 2 (two) times daily as needed for heartburn.     . fluticasone (FLONASE) 50 MCG/ACT nasal spray Place 2 sprays into the nose daily.      . furosemide (LASIX) 40 MG tablet Take 40 mg by mouth 2 (two) times daily.     Marland Kitchen ipratropium (ATROVENT) 0.02 % nebulizer solution Take 250 mcg by nebulization 4 (four) times daily.    Marland Kitchen levothyroxine (SYNTHROID, LEVOTHROID) 75 MCG tablet Take 75 mcg by mouth every morning.    . lidocaine (LIDODERM) 5 % Place 1 patch onto the skin daily as needed (for pain). Remove & Discard patch within 12 hours or as directed by MD. Pain.    . magnesium hydroxide (MILK OF MAGNESIA) 400 MG/5ML suspension Take 30 mLs by mouth as needed for mild constipation.    . Omega-3 Fatty Acids (FISH OIL PO) Take 1 capsule by mouth daily.    . pantoprazole (PROTONIX) 40 MG tablet Take 1 tablet (40 mg total) by mouth daily. 30 tablet 5  . PENTASA 250 MG CR capsule TAKE 4 CAPSULES 4 TIMES DAILY. 480 capsule 3  . polyethylene glycol (MIRALAX / GLYCOLAX) packet Take 17 g by mouth daily.    . polyethylene glycol-electrolytes (NULYTELY/GOLYTELY) 420 G solution Take 4,000 mLs by mouth once. 4000 mL 0  . potassium chloride (K-DUR) 10 MEQ tablet Take 1 tablet (10 mEq total) by mouth daily. 90 tablet 1  . potassium chloride (K-DUR,KLOR-CON) 10 MEQ tablet Take 10 mEq by mouth daily.    Marland Kitchen PROAIR HFA 108 (90 BASE) MCG/ACT inhaler Inhale 2 puffs into the lungs every 6 (six) hours as needed for wheezing or shortness of breath.     . ranitidine (ZANTAC) 150 MG tablet Take 1 tablet (150 mg total) by mouth at bedtime.      No results found for this or any previous visit (from the past 48 hour(s)). No  results found.  ROS  Pulse 60, temperature 98 F (36.7 C), temperature source Oral, resp. rate 16, height '5\' 2"'$  (1.575 m), weight 195 lb (88.451 kg), SpO2 100 %. Physical Exam  Constitutional: She appears well-developed and well-nourished.  HENT:  Mouth/Throat: Oropharynx is clear and moist.  Eyes: Conjunctivae are normal. No scleral icterus.  Neck: No thyromegaly present.  Cardiovascular: Normal rate, regular rhythm and normal heart sounds.   No murmur heard. Respiratory: Effort normal and breath sounds normal.  GI: Soft. She exhibits no distension and no mass. There is no tenderness.  Musculoskeletal: She exhibits no edema.  Lymphadenopathy:    She has no cervical  adenopathy.  Neurological: She is alert.  Skin: Skin is warm and dry.     Assessment/Plan  History of rectal bleeding.  Abnormal virtual colonoscopy.  Colonoscopy with polypectomy  REHMAN,NAJEEB U 05/06/2015, 2:35 PM

## 2015-05-06 NOTE — Discharge Instructions (Signed)
Resume usual medications and high fiber diet.  No driving for 24 hours.  Physician will call with biopsy results \   Colonoscopy, Care After These instructions give you information on caring for yourself after your procedure. Your doctor may also give you more specific instructions. Call your doctor if you have any problems or questions after your procedure. HOME CARE  Do not drive for 24 hours.  Do not sign important papers or use machinery for 24 hours.  You may shower.  You may go back to your usual activities, but go slower for the first 24 hours.  Take rest breaks often during the first 24 hours.  Walk around or use warm packs on your belly (abdomen) if you have belly cramping or gas.  Drink enough fluids to keep your pee (urine) clear or pale yellow.  Resume your normal diet. Avoid heavy or fried foods.  Avoid drinking alcohol for 24 hours or as told by your doctor.  Only take medicines as told by your doctor. If a tissue sample (biopsy) was taken during the procedure:   Do not take aspirin or blood thinners for 7 days, or as told by your doctor.  Do not drink alcohol for 7 days, or as told by your doctor.  Eat soft foods for the first 24 hours. GET HELP IF: You still have a small amount of blood in your poop (stool) 2-3 days after the procedure. GET HELP RIGHT AWAY IF:  You have more than a small amount of blood in your poop.  You see clumps of tissue (blood clots) in your poop.  Your belly is puffy (swollen).  You feel sick to your stomach (nauseous) or throw up (vomit).  You have a fever.  You have belly pain that gets worse and medicine does not help. MAKE SURE YOU:  Understand these instructions.  Will watch your condition.  Will get help right away if you are not doing well or get worse. Document Released: 10/20/2010 Document Revised: 09/22/2013 Document Reviewed: 05/25/2013 [facility] Patient Information 2015 [place], [place]. This information is  not intended to replace advice given to you by your health care provider. Make sure you discuss any questions you have with your health care provider.  High-Fiber Diet Fiber is found in fruits, vegetables, and grains. A high-fiber diet encourages the addition of more whole grains, legumes, fruits, and vegetables in your diet. The recommended amount of fiber for adult males is 38 g per day. For adult females, it is 25 g per day. Pregnant and lactating women should get 28 g of fiber per day. If you have a digestive or bowel problem, ask your caregiver for advice before adding high-fiber foods to your diet. Eat a variety of high-fiber foods instead of only a select few type of foods.  PURPOSE  To increase stool bulk.  To make bowel movements more regular to prevent constipation.  To lower cholesterol.  To prevent overeating. WHEN IS THIS DIET USED?  It may be used if you have constipation and hemorrhoids.  It may be used if you have uncomplicated diverticulosis (intestine condition) and irritable bowel syndrome.  It may be used if you need help with weight management.  It may be used if you want to add it to your diet as a protective measure against atherosclerosis, diabetes, and cancer. SOURCES OF FIBER  Whole-grain breads and cereals.  Fruits, such as apples, oranges, bananas, berries, prunes, and pears.  Vegetables, such as green peas, carrots, sweet potatoes,  beets, broccoli, cabbage, spinach, and artichokes.  Legumes, such split peas, soy, lentils.  Almonds. FIBER CONTENT IN FOODS Starches and Grains / Dietary Fiber (g)  Cheerios, 1 cup / 3 g  Corn Flakes cereal, 1 cup / 0.7 g  Rice crispy treat cereal, 1 cup / 0.3 g  Instant oatmeal (cooked),  cup / 2 g  Frosted wheat cereal, 1 cup / 5.1 g  Brown, long-grain rice (cooked), 1 cup / 3.5 g  White, long-grain rice (cooked), 1 cup / 0.6 g  Enriched macaroni (cooked), 1 cup / 2.5 g Legumes / Dietary Fiber  (g)  Baked beans (canned, plain, or vegetarian),  cup / 5.2 g  Kidney beans (canned),  cup / 6.8 g  Pinto beans (cooked),  cup / 5.5 g Breads and Crackers / Dietary Fiber (g)  Plain or honey graham crackers, 2 squares / 0.7 g  Saltine crackers, 3 squares / 0.3 g  Plain, salted pretzels, 10 pieces / 1.8 g  Whole-wheat bread, 1 slice / 1.9 g  White bread, 1 slice / 0.7 g  Raisin bread, 1 slice / 1.2 g  Plain bagel, 3 oz / 2 g  Flour tortilla, 1 oz / 0.9 g  Corn tortilla, 1 small / 1.5 g  Hamburger or hotdog bun, 1 small / 0.9 g Fruits / Dietary Fiber (g)  Apple with skin, 1 medium / 4.4 g  Sweetened applesauce,  cup / 1.5 g  Banana,  medium / 1.5 g  Grapes, 10 grapes / 0.4 g  Orange, 1 small / 2.3 g  Raisin, 1.5 oz / 1.6 g  Melon, 1 cup / 1.4 g Vegetables / Dietary Fiber (g)  Green beans (canned),  cup / 1.3 g  Carrots (cooked),  cup / 2.3 g  Broccoli (cooked),  cup / 2.8 g  Peas (cooked),  cup / 4.4 g  Mashed potatoes,  cup / 1.6 g  Lettuce, 1 cup / 0.5 g  Corn (canned),  cup / 1.6 g  Tomato,  cup / 1.1 g Document Released: 09/17/2005 Document Revised: 03/18/2012 Document Reviewed: 12/20/2011 [employer] Patient Information 2015 [place], [place]. This information is not intended to replace advice given to you by your health care provider. Make sure you discuss any questions you have with your health care provider.  Colon Polyps Polyps are lumps of extra tissue growing inside the body. Polyps can grow in the large intestine (colon). Most colon polyps are noncancerous (benign). However, some colon polyps can become cancerous over time. Polyps that are larger than a pea may be harmful. To be safe, caregivers remove and test all polyps. CAUSES  Polyps form when mutations in the genes cause your cells to grow and divide even though no more tissue is needed. RISK FACTORS There are a number of risk factors that can increase your chances of getting  colon polyps. They include:  Being older than 50 years.  Family history of colon polyps or colon cancer.  Long-term colon diseases, such as colitis or Crohn disease.  Being overweight.  Smoking.  Being inactive.  Drinking too much alcohol. SYMPTOMS  Most small polyps do not cause symptoms. If symptoms are present, they may include:  Blood in the stool. The stool may look dark red or black.  Constipation or diarrhea that lasts longer than 1 week. DIAGNOSIS People often do not know they have polyps until their caregiver finds them during a regular checkup. Your caregiver can use 4 tests to check for  polyps:  Digital rectal exam. The caregiver wears gloves and feels inside the rectum. This test would find polyps only in the rectum.  Barium enema. The caregiver puts a liquid called barium into your rectum before taking X-rays of your colon. Barium makes your colon look white. Polyps are dark, so they are easy to see in the X-ray pictures.  Sigmoidoscopy. A thin, flexible tube (sigmoidoscope) is placed into your rectum. The sigmoidoscope has a light and tiny camera in it. The caregiver uses the sigmoidoscope to look at the last third of your colon.  Colonoscopy. This test is like sigmoidoscopy, but the caregiver looks at the entire colon. This is the most common method for finding and removing polyps. TREATMENT  Any polyps will be removed during a sigmoidoscopy or colonoscopy. The polyps are then tested for cancer. PREVENTION  To help lower your risk of getting more colon polyps:  Eat plenty of fruits and vegetables. Avoid eating fatty foods.  Do not smoke.  Avoid drinking alcohol.  Exercise every day.  Lose weight if recommended by your caregiver.  Eat plenty of calcium and folate. Foods that are rich in calcium include milk, cheese, and broccoli. Foods that are rich in folate include chickpeas, kidney beans, and spinach. HOME CARE INSTRUCTIONS Keep all follow-up  appointments as directed by your caregiver. You may need periodic exams to check for polyps. SEEK MEDICAL CARE IF: You notice bleeding during a bowel movement. Document Released: 06/13/2004 Document Revised: 12/10/2011 Document Reviewed: 11/27/2011 [facility] Patient Information 2015 [place], [place]. This information is not intended to replace advice given to you by your health care provider. Make sure you discuss any questions you have with your health care provider.

## 2015-05-10 ENCOUNTER — Encounter (HOSPITAL_COMMUNITY): Payer: Medicare Other

## 2015-05-12 ENCOUNTER — Encounter (HOSPITAL_COMMUNITY): Payer: Medicare Other | Attending: Internal Medicine

## 2015-05-12 ENCOUNTER — Encounter (HOSPITAL_COMMUNITY): Payer: Self-pay

## 2015-05-12 DIAGNOSIS — C091 Malignant neoplasm of tonsillar pillar (anterior) (posterior): Secondary | ICD-10-CM | POA: Insufficient documentation

## 2015-05-12 DIAGNOSIS — D509 Iron deficiency anemia, unspecified: Secondary | ICD-10-CM | POA: Insufficient documentation

## 2015-05-12 DIAGNOSIS — E890 Postprocedural hypothyroidism: Secondary | ICD-10-CM | POA: Insufficient documentation

## 2015-05-12 DIAGNOSIS — E611 Iron deficiency: Secondary | ICD-10-CM | POA: Insufficient documentation

## 2015-05-12 DIAGNOSIS — Z438 Encounter for attention to other artificial openings: Secondary | ICD-10-CM | POA: Diagnosis present

## 2015-05-12 DIAGNOSIS — C3401 Malignant neoplasm of right main bronchus: Secondary | ICD-10-CM | POA: Insufficient documentation

## 2015-05-12 MED ORDER — SODIUM CHLORIDE 0.9 % IJ SOLN
10.0000 mL | INTRAMUSCULAR | Status: DC | PRN
Start: 2015-05-12 — End: 2015-05-12
  Administered 2015-05-12: 10 mL via INTRAVENOUS
  Filled 2015-05-12: qty 10

## 2015-05-12 MED ORDER — HEPARIN SOD (PORK) LOCK FLUSH 100 UNIT/ML IV SOLN
500.0000 [IU] | Freq: Once | INTRAVENOUS | Status: AC
  Administered 2015-05-12: 500 [IU] via INTRAVENOUS

## 2015-05-12 MED ORDER — HEPARIN SOD (PORK) LOCK FLUSH 100 UNIT/ML IV SOLN
INTRAVENOUS | Status: AC
  Filled 2015-05-12: qty 5

## 2015-05-12 NOTE — Patient Instructions (Signed)
[facility] at [facility] Discharge Instructions  RECOMMENDATIONS MADE BY THE CONSULTANT AND ANY TEST RESULTS WILL BE SENT TO YOUR REFERRING PHYSICIAN.  Port flush today. Return as scheduled for port flushes and office visit.  Thank you for choosing [facility] at [facility] to provide your oncology and hematology care.  To afford each patient quality time with our provider, please arrive at least 15 minutes before your scheduled appointment time.    You need to re-schedule your appointment should you arrive 10 or more minutes late.  We strive to give you quality time with our providers, and arriving late affects you and other patients whose appointments are after yours.  Also, if you no show three or more times for appointments you may be dismissed from the clinic at the providers discretion.     Again, thank you for choosing [facility].  Our hope is that these requests will decrease the amount of time that you wait before being seen by our physicians.       _____________________________________________________________  Should you have questions after your visit to [facility], please contact our office at [phone] between the hours of 8:30 a.m. and 4:30 p.m.  Voicemails left after 4:30 p.m. will not be returned until the following business day.  For prescription refill requests, have your pharmacy contact our office.    

## 2015-05-12 NOTE — Progress Notes (Signed)
Marry Guan Martinique presented for Portacath access and flush.  Proper placement of portacath confirmed by CXR.  Portacath located right chest wall accessed with  H 20 needle.  Good blood return present. Portacath flushed with 36m NS and 500U/568mHeparin and needle removed intact.  Procedure tolerated well and without incident.

## 2015-05-13 ENCOUNTER — Encounter (INDEPENDENT_AMBULATORY_CARE_PROVIDER_SITE_OTHER): Payer: Self-pay | Admitting: *Deleted

## 2015-06-07 ENCOUNTER — Encounter (HOSPITAL_COMMUNITY): Payer: Medicare Other | Attending: Internal Medicine

## 2015-06-07 DIAGNOSIS — C3401 Malignant neoplasm of right main bronchus: Secondary | ICD-10-CM | POA: Insufficient documentation

## 2015-06-07 DIAGNOSIS — E890 Postprocedural hypothyroidism: Secondary | ICD-10-CM | POA: Insufficient documentation

## 2015-06-07 DIAGNOSIS — E611 Iron deficiency: Secondary | ICD-10-CM | POA: Insufficient documentation

## 2015-06-07 DIAGNOSIS — C760 Malignant neoplasm of head, face and neck: Secondary | ICD-10-CM

## 2015-06-07 DIAGNOSIS — D509 Iron deficiency anemia, unspecified: Secondary | ICD-10-CM | POA: Insufficient documentation

## 2015-06-07 DIAGNOSIS — Z452 Encounter for adjustment and management of vascular access device: Secondary | ICD-10-CM

## 2015-06-07 DIAGNOSIS — C091 Malignant neoplasm of tonsillar pillar (anterior) (posterior): Secondary | ICD-10-CM | POA: Insufficient documentation

## 2015-06-07 MED ORDER — SODIUM CHLORIDE 0.9 % IJ SOLN
10.0000 mL | INTRAMUSCULAR | Status: DC | PRN
  Administered 2015-06-07: 10 mL via INTRAVENOUS
  Filled 2015-06-07: qty 10

## 2015-06-07 MED ORDER — HEPARIN SOD (PORK) LOCK FLUSH 100 UNIT/ML IV SOLN
500.0000 [IU] | Freq: Once | INTRAVENOUS | Status: AC
  Administered 2015-06-07: 500 [IU] via INTRAVENOUS
  Filled 2015-06-07: qty 5

## 2015-06-07 NOTE — Progress Notes (Signed)
[name] presented for Portacath access and flush. Proper placement of portacath confirmed by CXR. Portacath located right chest wall accessed with  H 20 needle. Good blood return present. Portacath flushed with 71m NS and 500U/557mHeparin and needle removed intact. Procedure without incident. Patient tolerated procedure well.

## 2015-07-14 ENCOUNTER — Other Ambulatory Visit (HOSPITAL_COMMUNITY): Payer: Self-pay

## 2015-07-14 DIAGNOSIS — D509 Iron deficiency anemia, unspecified: Secondary | ICD-10-CM

## 2015-07-14 DIAGNOSIS — R5383 Other fatigue: Secondary | ICD-10-CM

## 2015-07-19 ENCOUNTER — Encounter (HOSPITAL_COMMUNITY): Payer: Medicare Other

## 2015-07-19 ENCOUNTER — Other Ambulatory Visit (HOSPITAL_COMMUNITY): Payer: Medicare Other

## 2015-07-19 ENCOUNTER — Encounter (HOSPITAL_COMMUNITY): Payer: Self-pay | Admitting: Emergency Medicine

## 2015-07-19 ENCOUNTER — Emergency Department (HOSPITAL_COMMUNITY): Payer: Medicare Other

## 2015-07-19 ENCOUNTER — Inpatient Hospital Stay (HOSPITAL_COMMUNITY)
Admission: EM | Admit: 2015-07-19 | Discharge: 2015-07-21 | DRG: 190 | Disposition: A | Discharge status: Home / Self Care | Payer: Medicare Other | Attending: Internal Medicine | Admitting: Internal Medicine

## 2015-07-19 DIAGNOSIS — R0600 Dyspnea, unspecified: Secondary | ICD-10-CM

## 2015-07-19 DIAGNOSIS — Z7982 Long term (current) use of aspirin: Secondary | ICD-10-CM | POA: Diagnosis not present

## 2015-07-19 DIAGNOSIS — Z8673 Personal history of transient ischemic attack (TIA), and cerebral infarction without residual deficits: Secondary | ICD-10-CM | POA: Diagnosis not present

## 2015-07-19 DIAGNOSIS — I4891 Unspecified atrial fibrillation: Secondary | ICD-10-CM | POA: Diagnosis present

## 2015-07-19 DIAGNOSIS — D509 Iron deficiency anemia, unspecified: Secondary | ICD-10-CM | POA: Diagnosis present

## 2015-07-19 DIAGNOSIS — J9611 Chronic respiratory failure with hypoxia: Secondary | ICD-10-CM | POA: Diagnosis present

## 2015-07-19 DIAGNOSIS — J189 Pneumonia, unspecified organism: Secondary | ICD-10-CM | POA: Diagnosis present

## 2015-07-19 DIAGNOSIS — I5032 Chronic diastolic (congestive) heart failure: Secondary | ICD-10-CM | POA: Diagnosis present

## 2015-07-19 DIAGNOSIS — J441 Chronic obstructive pulmonary disease with (acute) exacerbation: Secondary | ICD-10-CM | POA: Diagnosis present

## 2015-07-19 DIAGNOSIS — M1990 Unspecified osteoarthritis, unspecified site: Secondary | ICD-10-CM | POA: Diagnosis present

## 2015-07-19 DIAGNOSIS — Z87891 Personal history of nicotine dependence: Secondary | ICD-10-CM | POA: Diagnosis not present

## 2015-07-19 DIAGNOSIS — Z9981 Dependence on supplemental oxygen: Secondary | ICD-10-CM | POA: Diagnosis not present

## 2015-07-19 DIAGNOSIS — J440 Chronic obstructive pulmonary disease with acute lower respiratory infection: Principal | ICD-10-CM | POA: Diagnosis present

## 2015-07-19 DIAGNOSIS — Z8249 Family history of ischemic heart disease and other diseases of the circulatory system: Secondary | ICD-10-CM | POA: Diagnosis not present

## 2015-07-19 DIAGNOSIS — Z85810 Personal history of malignant neoplasm of tongue: Secondary | ICD-10-CM | POA: Diagnosis not present

## 2015-07-19 DIAGNOSIS — R0602 Shortness of breath: Secondary | ICD-10-CM | POA: Diagnosis not present

## 2015-07-19 DIAGNOSIS — I110 Hypertensive heart disease with heart failure: Secondary | ICD-10-CM | POA: Diagnosis present

## 2015-07-19 DIAGNOSIS — I2510 Atherosclerotic heart disease of native coronary artery without angina pectoris: Secondary | ICD-10-CM | POA: Diagnosis present

## 2015-07-19 DIAGNOSIS — E039 Hypothyroidism, unspecified: Secondary | ICD-10-CM | POA: Diagnosis present

## 2015-07-19 DIAGNOSIS — E890 Postprocedural hypothyroidism: Secondary | ICD-10-CM | POA: Diagnosis present

## 2015-07-19 DIAGNOSIS — I252 Old myocardial infarction: Secondary | ICD-10-CM | POA: Diagnosis not present

## 2015-07-19 DIAGNOSIS — Z85118 Personal history of other malignant neoplasm of bronchus and lung: Secondary | ICD-10-CM | POA: Diagnosis not present

## 2015-07-19 DIAGNOSIS — Z85818 Personal history of malignant neoplasm of other sites of lip, oral cavity, and pharynx: Secondary | ICD-10-CM | POA: Diagnosis not present

## 2015-07-19 DIAGNOSIS — J9801 Acute bronchospasm: Secondary | ICD-10-CM

## 2015-07-19 DIAGNOSIS — K219 Gastro-esophageal reflux disease without esophagitis: Secondary | ICD-10-CM | POA: Diagnosis present

## 2015-07-19 DIAGNOSIS — K5090 Crohn's disease, unspecified, without complications: Secondary | ICD-10-CM | POA: Diagnosis present

## 2015-07-19 DIAGNOSIS — I10 Essential (primary) hypertension: Secondary | ICD-10-CM | POA: Diagnosis present

## 2015-07-19 HISTORY — DX: Diverticulosis of intestine, part unspecified, without perforation or abscess without bleeding: K57.90

## 2015-07-19 HISTORY — DX: Rectal polyp: K62.1

## 2015-07-19 LAB — CBC WITH DIFFERENTIAL/PLATELET
BASOS ABS: 0 10*3/uL (ref 0.0–0.1)
BASOS PCT: 1 %
Eosinophils Absolute: 0.2 10*3/uL (ref 0.0–0.7)
Eosinophils Relative: 4 %
HCT: 35.3 % — ABNORMAL LOW (ref 36.0–46.0)
HEMOGLOBIN: 10.7 g/dL — AB (ref 12.0–15.0)
Lymphocytes Relative: 14 %
Lymphs Abs: 0.9 10*3/uL (ref 0.7–4.0)
MCH: 25.8 pg — ABNORMAL LOW (ref 26.0–34.0)
MCHC: 30.3 g/dL (ref 30.0–36.0)
MCV: 85.3 fL (ref 78.0–100.0)
Monocytes Absolute: 0.6 10*3/uL (ref 0.1–1.0)
Monocytes Relative: 10 %
NEUTROS PCT: 71 %
Neutro Abs: 4.5 10*3/uL (ref 1.7–7.7)
Platelets: 296 10*3/uL (ref 150–400)
RBC: 4.14 MIL/uL (ref 3.87–5.11)
RDW: 17 % — ABNORMAL HIGH (ref 11.5–15.5)
WBC: 6.3 10*3/uL (ref 4.0–10.5)

## 2015-07-19 LAB — COMPREHENSIVE METABOLIC PANEL
ALBUMIN: 3.4 g/dL — AB (ref 3.5–5.0)
ALK PHOS: 82 U/L (ref 38–126)
ALT: 10 U/L — ABNORMAL LOW (ref 14–54)
AST: 15 U/L (ref 15–41)
Anion gap: 7 (ref 5–15)
BILIRUBIN TOTAL: 0.4 mg/dL (ref 0.3–1.2)
BUN: 11 mg/dL (ref 6–20)
CALCIUM: 8.6 mg/dL — AB (ref 8.9–10.3)
CO2: 36 mmol/L — AB (ref 22–32)
Chloride: 97 mmol/L — ABNORMAL LOW (ref 101–111)
Creatinine, Ser: 0.75 mg/dL (ref 0.44–1.00)
GFR calc Af Amer: >60 mL/min (ref >60)
GFR calc non Af Amer: >60 mL/min (ref >60)
GLUCOSE: 105 mg/dL — AB (ref 65–99)
Potassium: 3.9 mmol/L (ref 3.5–5.1)
Sodium: 140 mmol/L (ref 135–145)
TOTAL PROTEIN: 6.8 g/dL (ref 6.5–8.1)

## 2015-07-19 LAB — TROPONIN I: Troponin I: <0.03 ng/mL (ref <0.031)

## 2015-07-19 LAB — GLUCOSE, CAPILLARY
GLUCOSE-CAPILLARY: 175 mg/dL — AB (ref 65–99)
GLUCOSE-CAPILLARY: 128 mg/dL — AB (ref 65–99)
GLUCOSE-CAPILLARY: 142 mg/dL — AB (ref 65–99)

## 2015-07-19 LAB — BRAIN NATRIURETIC PEPTIDE: B NATRIURETIC PEPTIDE 5: 203 pg/mL — AB (ref 0.0–100.0)

## 2015-07-19 LAB — FERRITIN: Ferritin: 8 ng/mL — ABNORMAL LOW (ref 11–307)

## 2015-07-19 MED ORDER — IPRATROPIUM BROMIDE 0.02 % IN SOLN: 0.5000 mg | Freq: Four times a day (QID) | RESPIRATORY_TRACT | Status: DC

## 2015-07-19 MED ORDER — POTASSIUM CHLORIDE IN NACL 20-0.9 MEQ/L-% IV SOLN: INTRAVENOUS | Status: DC

## 2015-07-19 MED ORDER — ONDANSETRON HCL 4 MG/2ML IJ SOLN
4.0000 mg | Freq: Four times a day (QID) | INTRAMUSCULAR | Status: DC | PRN
  Administered 2015-07-20: 4 mg via INTRAVENOUS
  Filled 2015-07-19: qty 2

## 2015-07-19 MED ORDER — MAGNESIUM HYDROXIDE 400 MG/5ML PO SUSP: 30.0000 mL | ORAL | Status: DC | PRN

## 2015-07-19 MED ORDER — ENOXAPARIN SODIUM 40 MG/0.4ML ~~LOC~~ SOLN
40.0000 mg | SUBCUTANEOUS | Status: DC
  Administered 2015-07-19 – 2015-07-21 (×3): 40 mg via SUBCUTANEOUS
  Filled 2015-07-19 (×3): qty 0.4

## 2015-07-19 MED ORDER — DEXTROSE 5 % IV SOLN
500.0000 mg | Freq: Once | INTRAVENOUS | Status: AC
  Administered 2015-07-19: 500 mg via INTRAVENOUS
  Filled 2015-07-19: qty 500

## 2015-07-19 MED ORDER — GUAIFENESIN-DM 100-10 MG/5ML PO SYRP
5.0000 mL | ORAL_SOLUTION | ORAL | Status: DC | PRN
  Administered 2015-07-19: 5 mL via ORAL
  Filled 2015-07-19: qty 5

## 2015-07-19 MED ORDER — ENOXAPARIN SODIUM 40 MG/0.4ML ~~LOC~~ SOLN: 40.0000 mg | SUBCUTANEOUS | Status: DC

## 2015-07-19 MED ORDER — POLYETHYLENE GLYCOL 3350 17 G PO PACK
17.0000 g | PACK | Freq: Every day | ORAL | Status: DC
  Administered 2015-07-20 – 2015-07-21 (×2): 17 g via ORAL
  Filled 2015-07-19 (×3): qty 1

## 2015-07-19 MED ORDER — MESALAMINE ER 250 MG PO CPCR
1000.0000 mg | ORAL_CAPSULE | Freq: Four times a day (QID) | ORAL | Status: DC
  Administered 2015-07-19 – 2015-07-21 (×9): 1000 mg via ORAL
  Filled 2015-07-19 (×14): qty 4

## 2015-07-19 MED ORDER — ALBUTEROL SULFATE (2.5 MG/3ML) 0.083% IN NEBU
2.5000 mg | INHALATION_SOLUTION | RESPIRATORY_TRACT | Status: DC | PRN
  Administered 2015-07-21: 2.5 mg via RESPIRATORY_TRACT
  Filled 2015-07-19: qty 3

## 2015-07-19 MED ORDER — ALPRAZOLAM 0.25 MG PO TABS
0.2500 mg | ORAL_TABLET | Freq: Every evening | ORAL | Status: DC | PRN
  Administered 2015-07-20: 0.25 mg via ORAL
  Filled 2015-07-19: qty 1

## 2015-07-19 MED ORDER — CETYLPYRIDINIUM CHLORIDE 0.05 % MT LIQD
7.0000 mL | Freq: Two times a day (BID) | OROMUCOSAL | Status: DC
  Administered 2015-07-19 – 2015-07-20 (×3): 7 mL via OROMUCOSAL

## 2015-07-19 MED ORDER — SALINE SPRAY 0.65 % NA SOLN: 1.0000 | NASAL | Status: DC | PRN

## 2015-07-19 MED ORDER — FUROSEMIDE 40 MG PO TABS
40.0000 mg | ORAL_TABLET | Freq: Two times a day (BID) | ORAL | Status: DC
  Administered 2015-07-19 – 2015-07-21 (×5): 40 mg via ORAL
  Filled 2015-07-19 (×5): qty 1

## 2015-07-19 MED ORDER — LEVOTHYROXINE SODIUM 75 MCG PO TABS
75.0000 ug | ORAL_TABLET | Freq: Every day | ORAL | Status: DC
  Administered 2015-07-19 – 2015-07-21 (×3): 75 ug via ORAL
  Filled 2015-07-19 (×3): qty 1

## 2015-07-19 MED ORDER — PANTOPRAZOLE SODIUM 40 MG PO TBEC
40.0000 mg | DELAYED_RELEASE_TABLET | Freq: Every day | ORAL | Status: DC
  Administered 2015-07-19 – 2015-07-21 (×3): 40 mg via ORAL
  Filled 2015-07-19 (×3): qty 1

## 2015-07-19 MED ORDER — BENZONATATE 100 MG PO CAPS
100.0000 mg | ORAL_CAPSULE | Freq: Three times a day (TID) | ORAL | Status: DC
  Administered 2015-07-19 – 2015-07-21 (×7): 100 mg via ORAL
  Filled 2015-07-19 (×7): qty 1

## 2015-07-19 MED ORDER — METHYLPREDNISOLONE SODIUM SUCC 40 MG IJ SOLR
40.0000 mg | Freq: Three times a day (TID) | INTRAMUSCULAR | Status: DC
  Administered 2015-07-19 – 2015-07-21 (×6): 40 mg via INTRAVENOUS
  Filled 2015-07-19 (×6): qty 1

## 2015-07-19 MED ORDER — ONDANSETRON HCL 4 MG PO TABS: 4.0000 mg | ORAL_TABLET | Freq: Four times a day (QID) | ORAL | Status: DC | PRN

## 2015-07-19 MED ORDER — ASPIRIN EC 81 MG PO TBEC
81.0000 mg | DELAYED_RELEASE_TABLET | Freq: Every day | ORAL | Status: DC
  Administered 2015-07-19 – 2015-07-21 (×3): 81 mg via ORAL
  Filled 2015-07-19 (×5): qty 1

## 2015-07-19 MED ORDER — ALBUTEROL SULFATE (2.5 MG/3ML) 0.083% IN NEBU: 2.5000 mg | INHALATION_SOLUTION | Freq: Four times a day (QID) | RESPIRATORY_TRACT | Status: DC

## 2015-07-19 MED ORDER — IPRATROPIUM-ALBUTEROL 0.5-2.5 (3) MG/3ML IN SOLN
3.0000 mL | Freq: Four times a day (QID) | RESPIRATORY_TRACT | Status: DC
  Administered 2015-07-19 – 2015-07-20 (×6): 3 mL via RESPIRATORY_TRACT
  Filled 2015-07-19 (×6): qty 3

## 2015-07-19 MED ORDER — LEVOFLOXACIN IN D5W 750 MG/150ML IV SOLN
750.0000 mg | INTRAVENOUS | Status: DC
  Administered 2015-07-20 – 2015-07-21 (×2): 750 mg via INTRAVENOUS
  Filled 2015-07-19 (×2): qty 150

## 2015-07-19 MED ORDER — METHYLPREDNISOLONE SODIUM SUCC 125 MG IJ SOLR
125.0000 mg | Freq: Once | INTRAMUSCULAR | Status: AC
  Administered 2015-07-19: 125 mg via INTRAVENOUS
  Filled 2015-07-19: qty 2

## 2015-07-19 MED ORDER — FLUTICASONE PROPIONATE 50 MCG/ACT NA SUSP
2.0000 | Freq: Every day | NASAL | Status: DC
  Administered 2015-07-19 – 2015-07-21 (×3): 2 via NASAL
  Filled 2015-07-19 (×2): qty 16

## 2015-07-19 MED ORDER — INSULIN ASPART 100 UNIT/ML ~~LOC~~ SOLN: 0.0000 [IU] | Freq: Every day | SUBCUTANEOUS | Status: DC

## 2015-07-19 MED ORDER — FUROSEMIDE 40 MG PO TABS: 40.0000 mg | ORAL_TABLET | Freq: Two times a day (BID) | ORAL | Status: DC

## 2015-07-19 MED ORDER — OMEGA-3-ACID ETHYL ESTERS 1 G PO CAPS
1.0000 g | ORAL_CAPSULE | Freq: Every day | ORAL | Status: DC
  Administered 2015-07-20 – 2015-07-21 (×2): 1 g via ORAL
  Filled 2015-07-19 (×3): qty 1

## 2015-07-19 MED ORDER — POTASSIUM CHLORIDE IN NACL 20-0.9 MEQ/L-% IV SOLN
INTRAVENOUS | Status: AC
  Administered 2015-07-19: 11:00:00 via INTRAVENOUS

## 2015-07-19 MED ORDER — ONDANSETRON HCL 4 MG/2ML IJ SOLN
4.0000 mg | Freq: Once | INTRAMUSCULAR | Status: AC
  Administered 2015-07-19: 4 mg via INTRAVENOUS
  Filled 2015-07-19: qty 2

## 2015-07-19 MED ORDER — ACETAMINOPHEN 325 MG PO TABS
650.0000 mg | ORAL_TABLET | Freq: Four times a day (QID) | ORAL | Status: DC | PRN
  Administered 2015-07-19 – 2015-07-20 (×2): 650 mg via ORAL
  Filled 2015-07-19 (×2): qty 2

## 2015-07-19 MED ORDER — LEVOTHYROXINE SODIUM 75 MCG PO TABS: 75.0000 ug | ORAL_TABLET | Freq: Every morning | ORAL | Status: DC

## 2015-07-19 MED ORDER — LIDOCAINE 5 % EX PTCH: 1.0000 | MEDICATED_PATCH | Freq: Every day | CUTANEOUS | Status: DC | PRN

## 2015-07-19 MED ORDER — ACETAMINOPHEN 650 MG RE SUPP: 650.0000 mg | Freq: Four times a day (QID) | RECTAL | Status: DC | PRN

## 2015-07-19 MED ORDER — ATENOLOL 25 MG PO TABS
25.0000 mg | ORAL_TABLET | Freq: Every day | ORAL | Status: DC
  Administered 2015-07-20 – 2015-07-21 (×2): 25 mg via ORAL
  Filled 2015-07-19 (×3): qty 1

## 2015-07-19 MED ORDER — POTASSIUM CHLORIDE CRYS ER 10 MEQ PO TBCR
10.0000 meq | EXTENDED_RELEASE_TABLET | Freq: Every day | ORAL | Status: DC
  Administered 2015-07-19 – 2015-07-21 (×3): 10 meq via ORAL
  Filled 2015-07-19 (×3): qty 1

## 2015-07-19 MED ORDER — INSULIN ASPART 100 UNIT/ML ~~LOC~~ SOLN
0.0000 [IU] | Freq: Three times a day (TID) | SUBCUTANEOUS | Status: DC
  Administered 2015-07-19: 2 [IU] via SUBCUTANEOUS
  Administered 2015-07-19 – 2015-07-20 (×2): 3 [IU] via SUBCUTANEOUS
  Administered 2015-07-20 – 2015-07-21 (×2): 2 [IU] via SUBCUTANEOUS

## 2015-07-19 MED ORDER — IPRATROPIUM-ALBUTEROL 0.5-2.5 (3) MG/3ML IN SOLN
3.0000 mL | Freq: Once | RESPIRATORY_TRACT | Status: AC
  Administered 2015-07-19: 3 mL via RESPIRATORY_TRACT
  Filled 2015-07-19: qty 3

## 2015-07-19 NOTE — ED Notes (Signed)
Pt c/o sob.

## 2015-07-19 NOTE — H&P (Signed)
[facility] History and Physical  [name] MLY:[id number] DOB: 1927/08/23 DOA: 07/19/2015  Referring physician: ED physician, Dr. Tomi Bamberger PCP: [name], MD   Chief Complaint: Shortness of breath.  HPI: [name] is a 81 y.o. female with a history of metastatic head and neck cancer, lung cancer, Crohn's disease, GERD, CAD, and oxygen dependent COPD, who presents to the emergency department with a chief complaint of shortness of breath. Her shortness of breath started a couple days ago. This morning, she stated that "I couldn't breathe". She has had a productive cough with yellow sputum and intermittent sore throat. She has had more wheezing lately and has been using her nebulizer 3-4 times daily. She has been using her oxygen, primarily at night-2.5 L/m. She has had chills, but no subjective fever. She has had nausea, but no vomiting at home. She describes wanting to vomit in the ED. She denies abdominal pain. She denies pain with urination. She has had some pleurisy on the right. She has had no recent increase in swelling of her legs or unilateral lower extremity swelling.  In the ED, she is afebrile and hemodynamically stable. She is oxygenating between 90-99% on nasal cannula oxygen. Her chest x-ray reveals small left pleural effusion, vascular congestion, borderline cardiomegaly, and patchy bilateral airspace opacities which may reflect pneumonia or mild interstitial edema. Her white blood cell count is 6.3, BNP of 203, and troponin I of less than 0.03. She is being admitted for further evaluation and management.     Review of Systems:  As above in history present illness. In addition, review of systems is positive for intermittent constipation and diarrhea, gastroesophageal reflux disease, chronic swelling in her ankles. Otherwise review systems is negative.    Past Medical History  Diagnosis Date  . Essential hypertension, benign   . Crohn's disease ([place])   . Spinal  stenosis   . PSVT (paroxysmal supraventricular tachycardia) ([place])   . History of TIAs   . GERD (gastroesophageal reflux disease)   . Carotid artery disease ([place])     RICA 50-69% 12/09  . Myocardial infarction ([place])   . COPD (chronic obstructive pulmonary disease) ([place])     02 dependent  . Thyroid mass   . Incontinence of urine   . Anxiety   . Arthritis   . Atrial fibrillation (HCC)     Postoperative - suboptimal Coumadin candidate  . CAD (coronary artery disease), native coronary artery   . Port-a-cath in place 01/22/2012  . 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 Diverticulosis 05/2015    Per colonoscopy  . Rectal polyp 05/2015    Per colonoscopy   Past Surgical History  Procedure Laterality Date  . Left thoracotomy with wedge resection  2008    Left lower lobe  . Right tonsillectomy  2005  . Left thyroidectomy  2006  . Left breast biopsy  2011  . US echocardiography    . Cholecystectomy    . Cardiovascular stress test    . Abdominal hysterectomy    . Thyroidectomy  08/30/2011    Procedure: THYROIDECTOMY;  Surgeon: Cecil Cranker;  Location: [facility] OR;  Service: ENT;  Laterality: Right;  . Esophagogastroduodenoscopy (egd) with esophageal dilation N/A 05/06/2013    Procedure: ESOPHAGOGASTRODUODENOSCOPY (EGD) WITH ESOPHAGEAL DILATION;  Surgeon: Rogene Houston, MD;  Location: [facility];  Service: Endoscopy;  Laterality: N/A;  230  . Esophagogastroduodenoscopy N/A 04/27/2014    Procedure: ESOPHAGOGASTRODUODENOSCOPY (EGD);  Surgeon: Rogene Houston, MD;  Location: [facility];  Service: Endoscopy;  Laterality: N/A;  1230  . Balloon dilation N/A 04/27/2014    Procedure: BALLOON DILATION;  Surgeon: Rogene Houston, MD;  Location: [facility];  Service: Endoscopy;  Laterality: N/A;  Venia Minks dilation N/A 04/27/2014    Procedure: Venia Minks DILATION;   Surgeon: Rogene Houston, MD;  Location: [facility];  Service: Endoscopy;  Laterality: N/A;  . Savory dilation N/A 04/27/2014    Procedure: SAVORY DILATION;  Surgeon: Rogene Houston, MD;  Location: [facility];  Service: Endoscopy;  Laterality: N/A;  . Esophagogastroduodenoscopy N/A 02/25/2015    Procedure: ESOPHAGOGASTRODUODENOSCOPY (EGD);  Surgeon: Rogene Houston, MD;  [facility]: [facility];  Service: Endoscopy;  Laterality: N/A;  1155  . Esophageal dilation N/A 02/25/2015    Procedure: ESOPHAGEAL DILATION;  Surgeon: Rogene Houston, MD;  [facility]: [facility];  Service: Endoscopy;  Laterality: N/A;  . Colonoscopy N/A 05/06/2015    Procedure: COLONOSCOPY;  Surgeon: Rogene Houston, MD;  [facility]: [facility];  Service: Endoscopy;  Laterality: N/A;  1255   Social History: Patient is widowed. She lives alone. She has a home health aide who comes for 4 hours daily. She reports that she quit smoking about 21 years ago. Her smoking use included Cigarettes. She has never used smokeless tobacco. She reports that she does not drink alcohol or use illicit drugs. She still drives occasionally.  Allergies  Allergen Reactions  . Codeine Nausea And Vomiting  . Morphine And Related Nausea And Vomiting  . Penicillins Hives and Swelling    Family History  Problem Relation Age of Onset  . Coronary artery disease Father     Died age 64  . Dementia Mother     Prior to Admission medications   Medication Sig Start Date End Date Taking? Authorizing Provider  albuterol (PROVENTIL) (2.5 MG/3ML) 0.083% nebulizer solution Take 2.5 mg by nebulization 4 (four) times daily.    Historical Provider, MD  ALPRAZolam Duanne Moron) 0.25 MG tablet Take 0.25 mg by mouth at bedtime as needed. For sleep.    Historical Provider, MD  aspirin 81 MG EC tablet 81 mg tablet; oral 04/08/12   Historical Provider, MD  atenolol (TENORMIN) 25 MG tablet TAKE (1) TABLET BY MOUTH ONCE DAILY. 03/23/15   Satira Sark, MD  Calcium &  Magnesium Carbonates (MYLANTA PO) Take 15 mLs by mouth as needed (acid reflux).     Historical Provider, MD  calcium carbonate (TUMS - DOSED IN MG ELEMENTAL CALCIUM) 500 MG chewable tablet Chew 1 tablet by mouth 2 (two) times daily as needed for heartburn.     Historical Provider, MD  fluticasone (FLONASE) 50 MCG/ACT nasal spray Place 2 sprays into the nose daily.      Historical Provider, MD  furosemide (LASIX) 40 MG tablet Take 40 mg by mouth 2 (two) times daily.  04/16/13   Satira Sark, MD  ipratropium (ATROVENT) 0.02 % nebulizer solution Take 250 mcg by nebulization 4 (four) times daily.    Historical Provider, MD  levothyroxine (SYNTHROID, LEVOTHROID) 75 MCG tablet Take 75 mcg by mouth every morning.    Historical Provider, MD  lidocaine (LIDODERM) 5 % Place 1 patch onto the skin daily as needed (for pain). Remove & Discard patch within 12 hours or as directed by MD. Pain.  Historical Provider, MD  magnesium hydroxide (MILK OF MAGNESIA) 400 MG/5ML suspension Take 30 mLs by mouth as needed for mild constipation.    Historical Provider, MD  Omega-3 Fatty Acids (FISH OIL PO) Take 1 capsule by mouth daily.    Historical Provider, MD  pantoprazole (PROTONIX) 40 MG tablet Take 1 tablet (40 mg total) by mouth daily. 04/27/14   Rogene Houston, MD  PENTASA 250 MG CR capsule TAKE 4 CAPSULES 4 TIMES DAILY. 02/10/15   Butch Penny, NP  polyethylene glycol (MIRALAX / GLYCOLAX) packet Take 17 g by mouth daily.    Historical Provider, MD  potassium chloride (K-DUR) 10 MEQ tablet Take 1 tablet (10 mEq total) by mouth daily. 04/16/13   Satira Sark, MD  potassium chloride (K-DUR,KLOR-CON) 10 MEQ tablet Take 10 mEq by mouth daily. 03/10/15   Historical Provider, MD  PROAIR HFA 108 (90 BASE) MCG/ACT inhaler Inhale 2 puffs into the lungs every 6 (six) hours as needed for wheezing or shortness of breath.  07/10/12   Historical Provider, MD  ranitidine (ZANTAC) 150 MG tablet Take 1 tablet (150 mg total) by  mouth at bedtime. 02/25/15   Rogene Houston, MD   Physical Exam: Filed Vitals:   07/19/15 0630 07/19/15 0702 07/19/15 0704 07/19/15 0800  BP: 137/47  134/51 125/43  Pulse: 65  65 65  Temp:   98.1 F (36.7 C)   TempSrc:   Oral   Resp: '20  18 14  '$ Height:      Weight:      SpO2: 92% 99% 99% 92%    Wt Readings from Last 3 Encounters:  07/19/15 86.183 kg (190 lb)  05/12/15 88.451 kg (195 lb)  05/06/15 88.451 kg (195 lb)    General:  Appears calm and comfortable; 81 year old woman in no acute distress. Eyes: PERRL, normal lids, irises & conjunctiva; conjunctivae are clear and sclerae are white. ENT: Oropharynx reveals mildly dry mucous membranes; no posterior pharyngeal exudates or erythema; no nasal rhinorrhea. Neck: no LAD, masses or thyromegaly Cardiovascular: S1, S2, with 2/3 systolic murmur. Trace of pedal edema bilaterally. Telemetry: SR, no arrhythmias  Respiratory: Scattered bilateral wheezes and crackles; breathing nonlabored at rest. Abdomen: Obese, positive bowel sounds, soft, nontender, nondistended. Skin: no rash or induration seen on limited exam; varicosities of her lower extremities noted. Musculoskeletal: grossly normal tone BUE/BLE; no acute hot red joints. Psychiatric: grossly normal mood and affect, speech fluent and appropriate Neurologic: grossly non-focal; cranial nerves II through XII grossly intact.           Labs on Admission:  Basic Metabolic Panel:  Recent Labs Lab 07/19/15 0430  NA 140  K 3.9  CL 97*  CO2 36*  GLUCOSE 105*  BUN 11  CREATININE 0.75  CALCIUM 8.6*   Liver Function Tests:  Recent Labs Lab 07/19/15 0430  AST 15  ALT 10*  ALKPHOS 82  BILITOT 0.4  PROT 6.8  ALBUMIN 3.4*   No results for input(s): LIPASE, AMYLASE in the last 168 hours. No results for input(s): AMMONIA in the last 168 hours. CBC:  Recent Labs Lab 07/19/15 0430  WBC 6.3  NEUTROABS 4.5  HGB 10.7*  HCT 35.3*  MCV 85.3  PLT 296   Cardiac  Enzymes:  Recent Labs Lab 07/19/15 0430  TROPONINI <0.03    BNP (last 3 results)  Recent Labs  07/19/15 0430  BNP 203.0*    ProBNP (last 3 results) No results for input(s): PROBNP in the last  8760 hours.  CBG: No results for input(s): GLUCAP in the last 168 hours.  Radiological Exams on Admission: Dg Chest 2 View  07/19/2015  CLINICAL DATA:  Acute onset of shortness of breath and wheezing. Initial encounter. EXAM: CHEST  2 VIEW COMPARISON:  Chest radiograph performed 11/13/2013, and CT of the chest performed 04/15/2015 FINDINGS: The lungs are well-aerated. A small left pleural effusion is noted. Vascular congestion is seen. Patchy bilateral airspace opacities may reflect pneumonia or mild interstitial edema. Mild peripheral scarring is noted at the left midlung zone. No pneumothorax is seen. The heart is borderline enlarged. No acute osseous abnormalities are seen. A right-sided chest port is noted ending about the mid SVC. IMPRESSION: Small left pleural effusion noted. Vascular congestion and borderline cardiomegaly. Patchy bilateral airspace opacities may reflect pneumonia or mild interstitial edema. Mild peripheral scarring at the left midlung zone. Electronically Signed   By: Garald Balding M.D.   On: 07/19/2015 05:51    EKG: Independently reviewed. Sinus rhythm with a heart rate of 65 bpm, borderline LVH.  Assessment/Plan Principal Problem:   CAP (community acquired pneumonia) Active Problems:   Essential hypertension, benign   Coronary atherosclerosis of native coronary artery   GERD (gastroesophageal reflux disease)   Anemia, iron deficiency   COPD with exacerbation (HCC)   Chronic respiratory failure with hypoxia ([place])   1. Community-acquired pneumonia with superimposed COPD with exacerbation. The patient was given azithromycin, DuoNeb, and 125 mg of Solu-Medrol in the ED. She will be continued on DuoNeb every 6 hours and albuterol every 2 hours when necessary, 3  times a day dosing of Solu-Medrol, IV Levaquin, Tessalon Perles, and Robitussin-DM as needed. Oxygen will continue at 2.5 L/m. Her chest x-ray was suggestive of vascular congestion and mild pleural effusion. She does not report a history of CHF, but says that she has chronic swelling in her legs for which she takes Lasix. Lasix will be continued. We'll start gentle IV fluids 24 hours. Because of IV Solu-Medrol, will start sliding scale NovoLog in anticipation of steroid-induced hyperglycemia. 2. Oxygen dependent COPD/chronic respiratory failure with hypoxia. The patient reports nocturnal hypoxia which requires 2.5 L oxygen at bedtime. Oxygen therapy will continue. 3. CAD. Patient reports mild right-sided pleurisy which is likely from pneumonia and less likely from angina. Her troponin I was negative 1. Her EKG reveals no ST or T-wave abnormalities. 4. Essential hypertension. Currently stable and controlled. 5. Iron deficiency anemia. Currently stable and at baseline. Recent colonoscopy revealed a rectal polyp and diverticulosis. 6. Gastroesophageal reflux disease. Will continue PPI, but will hold H2-blocker. 7. Hypothyroidism. Will continue Synthroid. We'll check a TSH.    Code Status: Discussed with patient and she was to be a full code. DVT Prophylaxis: Lovenox Family Communication: Family not available; discussed with patient. Disposition Plan: Anticipate discharge to home in 2-3 days.  Time spent: One hour  [facility] Pager [phone]

## 2015-07-19 NOTE — Progress Notes (Signed)
ANTIBIOTIC CONSULT NOTE - INITIAL  Pharmacy Consult for Levaquin Indication: CAP  Allergies  Allergen Reactions  . Codeine Nausea And Vomiting  . Morphine And Related Nausea And Vomiting  . Penicillins Hives and Swelling   Patient Measurements: Height: '5\' 4"'$  (162.6 cm) Weight: 190 lb (86.183 kg) IBW/kg (Calculated) : 54.7  Vital Signs: Temp: 97.9 F (36.6 C) (10/18 0845) Temp Source: Oral (10/18 0845) BP: 138/56 mmHg (10/18 0845) Pulse Rate: 79 (10/18 0845) Intake/Output from previous day:   Intake/Output from this shift: Total I/O In: 240 [P.O.:240] Out: -   Labs:  Recent Labs  07/19/15 0430  WBC 6.3  HGB 10.7*  PLT 296  CREATININE 0.75   Estimated Creatinine Clearance: 53.6 mL/min (by C-G formula based on Cr of 0.75). No results for input(s): VANCOTROUGH, VANCOPEAK, VANCORANDOM, GENTTROUGH, GENTPEAK, GENTRANDOM, TOBRATROUGH, TOBRAPEAK, TOBRARND, AMIKACINPEAK, AMIKACINTROU, AMIKACIN in the last 72 hours.   Microbiology: No results found for this or any previous visit (from the past 720 hour(s)).  Medical History: Past Medical History  Diagnosis Date  . Essential hypertension, benign   . Crohn's disease ([place])   . Spinal stenosis   . PSVT (paroxysmal supraventricular tachycardia) ([place])   . History of TIAs   . GERD (gastroesophageal reflux disease)   . Carotid artery disease ([place])     RICA 50-69% 12/09  . Myocardial infarction ([place])   . COPD (chronic obstructive pulmonary disease) ([place])     02 dependent  . Thyroid mass   . Incontinence of urine   . Anxiety   . Arthritis   . Atrial fibrillation (HCC)     Postoperative - suboptimal Coumadin candidate  . CAD (coronary artery disease), native coronary artery   . Port-a-cath in place 01/22/2012  . 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 Diverticulosis 05/2015    Per  colonoscopy  . Rectal polyp 05/2015    Per colonoscopy   Anti-infectives    Start     Dose/Rate Route Frequency Ordered Stop   07/20/15 0600  levofloxacin (LEVAQUIN) IVPB 750 mg     750 mg 100 mL/hr over 90 Minutes Intravenous Every 24 hours 07/19/15 0824     07/19/15 0530  azithromycin (ZITHROMAX) 500 mg in dextrose 5 % 250 mL IVPB     500 mg 250 mL/hr over 60 Minutes Intravenous  Once 07/19/15 0518 07/19/15 3546     Assessment: 81 y.o. female with a history of metastatic head and neck cancer, lung cancer, Crohn's disease, GERD, CAD, and oxygen dependent COPD, who presents to the emergency department with a chief complaint of shortness of breath. Her shortness of breath started a couple days ago. This morning, she stated that "I couldn't breathe". She has had a productive cough with yellow sputum and intermittent sore throat. She has had more wheezing lately and has been using her nebulizer 3-4 times daily. She has been using her oxygen, primarily at night-2.5 L/m. She has had chills, but no subjective fever. She has had nausea, but no vomiting at home.   Goal of Therapy:  Eradicate infection  Plan:  Levaquin '750mg'$  IV q24hrs Monitor labs, renal fxn, progress and c/s  [place] Crane, [place] Curl A 07/19/2015,10:34 AM

## 2015-07-19 NOTE — ED Provider Notes (Signed)
CSN: [id number]     Arrival date & time 07/19/15  0410 History   First MD Initiated Contact with Patient 07/19/15 0447    Chief Complaint  Patient presents with  . Shortness of Breath     (Consider location/radiation/quality/duration/timing/severity/associated sxs/prior Treatment) HPI patient states she woke up tonight use the bathroom and states "I couldn't breathe". She states she has been having problems since the fall weather and the leaves starting to fall. She has had a cough for the past 2 weeks. She states she's coughing up yellow sputum. She states she has been using her nebulizer however it is not lasting very long and she's having to use it 3-4 times a day recently. She is unaware of fever but states she's been having chills the past couple days. She has a sore throat, clear rhinorrhea, and states she had nausea with vomiting twice today. She denies any diarrhea. She has a history of chronic vomiting and diarrhea and has had evaluation by her gastroenterologist with virtual colonoscopy and colonoscopy.  PCP Dr Willey Blade  Past Medical History  Diagnosis Date  . Essential hypertension, benign   . Crohn's disease ([place])   . Spinal stenosis   . PSVT (paroxysmal supraventricular tachycardia) ([place])   . History of TIAs   . GERD (gastroesophageal reflux disease)   . Carotid artery disease ([place])     RICA 50-69% 12/09  . Myocardial infarction ([place])   . COPD (chronic obstructive pulmonary disease) ([place])     02 dependent  . Thyroid mass   . Incontinence of urine   . Anxiety   . Arthritis   . Atrial fibrillation (HCC)     Postoperative - suboptimal Coumadin candidate  . CAD (coronary artery disease), native coronary artery   . Port-a-cath in place 01/22/2012  . 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 Left thoracotomy with wedge resection  2008    Left lower lobe  . Right tonsillectomy  2005  . Left thyroidectomy  2006  . Left breast biopsy  2011  . US echocardiography    . Cholecystectomy    . Cardiovascular stress test    . Abdominal hysterectomy    . Thyroidectomy  08/30/2011    Procedure: THYROIDECTOMY;  Surgeon: Cecil Cranker;  Location: [facility] OR;  Service: ENT;  Laterality: Right;  . Esophagogastroduodenoscopy (egd) with esophageal dilation N/A 05/06/2013    Procedure: ESOPHAGOGASTRODUODENOSCOPY (EGD) WITH ESOPHAGEAL DILATION;  Surgeon: Rogene Houston, MD;  Location: [facility];  Service: Endoscopy;  Laterality: N/A;  230  . Esophagogastroduodenoscopy N/A 04/27/2014    Procedure: ESOPHAGOGASTRODUODENOSCOPY (EGD);  Surgeon: Rogene Houston, MD;  Location: [facility];  Service: Endoscopy;  Laterality: N/A;  1230  . Balloon dilation N/A 04/27/2014    Procedure: BALLOON DILATION;  Surgeon: Rogene Houston, MD;  Location: [facility];  Service: Endoscopy;  Laterality: N/A;  Venia Minks dilation N/A 04/27/2014    Procedure: Venia Minks DILATION;  Surgeon: Rogene Houston, MD;  [facility]: [facility];  Service: Endoscopy;  Laterality: N/A;  . Savory dilation N/A 04/27/2014    Procedure: SAVORY DILATION;  Surgeon: Rogene Houston, MD;  [facility]: [facility];  Service: Endoscopy;  Laterality: N/A;  . Esophagogastroduodenoscopy N/A 02/25/2015    Procedure: ESOPHAGOGASTRODUODENOSCOPY (EGD);  Surgeon: Rogene Houston, MD;  [facility];  Service: Endoscopy;  Laterality: N/A;  1155  . Esophageal dilation N/A 02/25/2015    Procedure: ESOPHAGEAL DILATION;  Surgeon: Rogene Houston, MD;  [facility];  Service: Endoscopy;  Laterality: N/A;  . Colonoscopy N/A 05/06/2015    Procedure: COLONOSCOPY;  Surgeon: Rogene Houston, MD;  [facility];  Service: Endoscopy;  Laterality: N/A;  1255   Family History  Problem Relation Age of Onset  . Coronary artery disease Father      Died age 53  . Dementia Mother    Social History  Substance Use Topics  . Smoking status: Former Smoker    Types: Cigarettes    Quit date: 10/20/1993  . Smokeless tobacco: Never Used  . Alcohol Use: No   Lives at home Lives alone Uses oxygen 2.5  lpm [place] mainly at night  OB History    No data available     Review of Systems  All other systems reviewed and are negative.     Allergies  Codeine; Morphine and related; and Penicillins  Home Medications   Prior to Admission medications   Medication Sig Start Date End Date Taking? Authorizing Provider  albuterol (PROVENTIL) (2.5 MG/3ML) 0.083% nebulizer solution Take 2.5 mg by nebulization 4 (four) times daily.    Historical Provider, MD  ALPRAZolam Duanne Moron) 0.25 MG tablet Take 0.25 mg by mouth at bedtime as needed. For sleep.    Historical Provider, MD  aspirin 81 MG EC tablet 81 mg tablet; oral 04/08/12   Historical Provider, MD  atenolol (TENORMIN) 25 MG tablet TAKE (1) TABLET BY MOUTH ONCE DAILY. 03/23/15   Satira Sark, MD  Calcium & Magnesium Carbonates (MYLANTA PO) Take 15 mLs by mouth as needed (acid reflux).     Historical Provider, MD  calcium carbonate (TUMS - DOSED IN MG ELEMENTAL CALCIUM) 500 MG chewable tablet Chew 1 tablet by mouth 2 (two) times daily as needed for heartburn.     Historical Provider, MD  fluticasone (FLONASE) 50 MCG/ACT nasal spray Place 2 sprays into the nose daily.      Historical Provider, MD  furosemide (LASIX) 40 MG tablet Take 40 mg by mouth 2 (two) times daily.  04/16/13   Satira Sark, MD  ipratropium (ATROVENT) 0.02 % nebulizer solution Take 250 mcg by nebulization 4 (four) times daily.    Historical Provider, MD  levothyroxine (SYNTHROID, LEVOTHROID) 75 MCG tablet Take 75 mcg by mouth every morning.    Historical Provider, MD  lidocaine (LIDODERM) 5 % Place 1 patch onto the skin daily as needed (for pain). Remove & Discard patch within 12 hours or as directed by MD. Pain.     Historical Provider, MD  magnesium hydroxide (MILK OF MAGNESIA) 400 MG/5ML suspension Take 30 mLs by mouth as needed for mild constipation.    Historical Provider, MD  Omega-3 Fatty Acids (FISH OIL PO) Take 1 capsule by mouth daily.    Historical Provider, MD  pantoprazole (PROTONIX) 40 MG tablet Take 1 tablet (40 mg total) by mouth daily. 04/27/14   Rogene Houston, MD  PENTASA 250 MG CR capsule TAKE 4 CAPSULES 4 TIMES DAILY. 02/10/15   Butch Penny, NP  polyethylene glycol (MIRALAX / GLYCOLAX) packet Take 17 g by mouth daily.    Historical Provider, MD  potassium chloride (K-DUR) 10 MEQ tablet Take 1 tablet (10 mEq total) by mouth daily. 04/16/13   Aloha Gell  Domenic Polite, MD  potassium chloride (K-DUR,KLOR-CON) 10 MEQ tablet Take 10 mEq by mouth daily. 03/10/15   Historical Provider, MD  PROAIR HFA 108 (90 BASE) MCG/ACT inhaler Inhale 2 puffs into the lungs every 6 (six) hours as needed for wheezing or shortness of breath.  07/10/12   Historical Provider, MD  ranitidine (ZANTAC) 150 MG tablet Take 1 tablet (150 mg total) by mouth at bedtime. 02/25/15   Rogene Houston, MD   BP 127/101 mmHg  Pulse 63  Temp(Src) 98.7 F (37.1 C) (Oral)  Resp 16  Ht '5\' 4"'$  (1.626 m)  Wt 190 lb (86.183 kg)  BMI 32.60 kg/m2  SpO2 100%  Vital signs normal   Physical Exam  Constitutional: She is oriented to person, place, and time. She appears well-developed and well-nourished.  Non-toxic appearance. She does not appear ill. No distress.  Patient did not understand me when I spoke in a normal voice. I was having to raise my voice or she could hear me. However one point she told me to "stop yelling at me". I apologized stating I thought she was hard of hearing, patient states she is, and I apologized for speaking too loudly to her.  HENT:  Head: Normocephalic and atraumatic.  Right Ear: External ear normal.  Left Ear: External ear normal.  Nose: Nose normal. No mucosal edema or rhinorrhea.  Mouth/Throat: Oropharynx is  clear and moist and mucous membranes are normal. No dental abscesses or uvula swelling.  Eyes: Conjunctivae and EOM are normal. Pupils are equal, round, and reactive to light.  Neck: Normal range of motion and full passive range of motion without pain. Neck supple.  Cardiovascular: Normal rate, regular rhythm and normal heart sounds.  Exam reveals no gallop and no friction rub.   No murmur heard. Pulmonary/Chest: Effort normal. No respiratory distress. She has wheezes. She has no rhonchi. She has no rales. She exhibits no tenderness and no crepitus.  Pt seen after getting a nebulizer treatment  Abdominal: Soft. Normal appearance and bowel sounds are normal. She exhibits no distension. There is no tenderness. There is no rebound and no guarding.  Musculoskeletal: Normal range of motion. She exhibits no edema or tenderness.  Moves all extremities well.   Neurological: She is alert and oriented to person, place, and time. She has normal strength. No cranial nerve deficit.  Skin: Skin is warm, dry and intact. No rash noted. No erythema. No pallor.  Psychiatric: She has a normal mood and affect. Her speech is normal and behavior is normal. Her mood appears not anxious.  Nursing note and vitals reviewed.   ED Course  Procedures (including critical care time)  Medications  ipratropium-albuterol (DUONEB) 0.5-2.5 (3) MG/3ML nebulizer solution 3 mL (3 mLs Nebulization Given 07/19/15 0441)  azithromycin (ZITHROMAX) 500 mg in dextrose 5 % 250 mL IVPB (0 mg Intravenous Stopped 07/19/15 0646)  methylPREDNISolone sodium succinate (SOLU-MEDROL) 125 mg/2 mL injection 125 mg (125 mg Intravenous Given 07/19/15 0530)  ondansetron (ZOFRAN) injection 4 mg (4 mg Intravenous Given 07/19/15 0549)    Patient refused a second nebulizer treatment. At the time of my exam she had just finished her first nebulizer treatment. Due to her symptoms with productive cough and underlying lung disease requiring oxygen at night,  patient was started on antibiotics for orchitis or possible pneumonia. She was given IV steroids also. After reviewing her chest x-ray BNP was ordered.  06:45 pt sleeping, pulse ox 91% on [place] oxygen. PT given her CXR results. States  she is still having nausea. States she thinks she has taken a zpak before without problems. Feels she needs to be admitted instead of being sent home.   07:14 Dr Caryn Section, admit to her, attending Dr Willey Blade, telemetry   Labs Review Results for orders placed or performed during the hospital encounter of 07/19/15  CBC with Differential  Result Value Ref Range   WBC 6.3 4.0 - 10.5 K/uL   RBC 4.14 3.87 - 5.11 MIL/uL   Hemoglobin 10.7 (L) 12.0 - 15.0 g/dL   HCT 35.3 (L) 36.0 - 46.0 %   MCV 85.3 78.0 - 100.0 fL   MCH 25.8 (L) 26.0 - 34.0 pg   MCHC 30.3 30.0 - 36.0 g/dL   RDW 17.0 (H) 11.5 - 15.5 %   Platelets 296 150 - 400 K/uL   Neutrophils Relative % 71 %   Neutro Abs 4.5 1.7 - 7.7 K/uL   Lymphocytes Relative 14 %   Lymphs Abs 0.9 0.7 - 4.0 K/uL   Monocytes Relative 10 %   Monocytes Absolute 0.6 0.1 - 1.0 K/uL   Eosinophils Relative 4 %   Eosinophils Absolute 0.2 0.0 - 0.7 K/uL   Basophils Relative 1 %   Basophils Absolute 0.0 0.0 - 0.1 K/uL  Comprehensive metabolic panel  Result Value Ref Range   Sodium 140 135 - 145 mmol/L   Potassium 3.9 3.5 - 5.1 mmol/L   Chloride 97 (L) 101 - 111 mmol/L   CO2 36 (H) 22 - 32 mmol/L   Glucose, Bld 105 (H) 65 - 99 mg/dL   BUN 11 6 - 20 mg/dL   Creatinine, Ser 0.75 0.44 - 1.00 mg/dL   Calcium 8.6 (L) 8.9 - 10.3 mg/dL   Total Protein 6.8 6.5 - 8.1 g/dL   Albumin 3.4 (L) 3.5 - 5.0 g/dL   AST 15 15 - 41 U/L   ALT 10 (L) 14 - 54 U/L   Alkaline Phosphatase 82 38 - 126 U/L   Total Bilirubin 0.4 0.3 - 1.2 mg/dL   GFR calc non Af Amer >60 >60 mL/min   GFR calc Af Amer >60 >60 mL/min   Anion gap 7 5 - 15  Troponin I  Result Value Ref Range   Troponin I <0.03 <0.031 ng/mL  Brain natriuretic peptide  Result Value Ref  Range   B Natriuretic Peptide 203.0 (H) 0.0 - 100.0 pg/mL   Vital signs normal except for mild anemia, metabolic alkalosis c/w probable chronic respiratory acidosis     Imaging Review Dg Chest 2 View  07/19/2015  CLINICAL DATA:  Acute onset of shortness of breath and wheezing. Initial encounter. EXAM: CHEST  2 VIEW COMPARISON:  Chest radiograph performed 11/13/2013, and CT of the chest performed 04/15/2015 FINDINGS: The lungs are well-aerated. A small left pleural effusion is noted. Vascular congestion is seen. Patchy bilateral airspace opacities may reflect pneumonia or mild interstitial edema. Mild peripheral scarring is noted at the left midlung zone. No pneumothorax is seen. The heart is borderline enlarged. No acute osseous abnormalities are seen. A right-sided chest port is noted ending about the mid SVC. IMPRESSION: Small left pleural effusion noted. Vascular congestion and borderline cardiomegaly. Patchy bilateral airspace opacities may reflect pneumonia or mild interstitial edema. Mild peripheral scarring at the left midlung zone. Electronically Signed   By: Garald Balding M.D.   On: 07/19/2015 05:51   I have personally reviewed and evaluated these images and lab results as part of my medical decision-making.   EKG  Interpretation   Date/Time:  Tuesday July 19 2015 04:22:46 EDT Ventricular Rate:  65 PR Interval:  33 QRS Duration: 105 QT Interval:  426 QTC Calculation: 443 R Axis:   74 Text Interpretation:  Age not entered, assumed to be  81 years old for  purpose of ECG interpretation Sinus rhythm Short PR interval Left  ventricular hypertrophy Since last tracing 13 Nov 2013 Normal sinus rhythm  has replaced Atrial fibrillation with rapid ventricular response Confirmed  by Chamille Werntz  MD-I, Biff Rutigliano ([id number]) on 07/19/2015 4:47:49 AM      MDM   Final diagnoses:  CAP (community acquired pneumonia)  Dyspnea  Bronchospasm   Plan admission   Rolland Porter, MD, Barbette Or, MD 07/19/15 [phone]

## 2015-07-19 NOTE — Progress Notes (Unsigned)
Patient in emergency room. She didn't want to miss her lab appt. She had a cbcd completed in the ER. We added her ferritin on to blood in the lab

## 2015-07-20 LAB — CBC
HEMATOCRIT: 30.1 % — AB (ref 36.0–46.0)
HEMOGLOBIN: 9.1 g/dL — AB (ref 12.0–15.0)
MCH: 25.6 pg — ABNORMAL LOW (ref 26.0–34.0)
MCHC: 30.2 g/dL (ref 30.0–36.0)
MCV: 84.8 fL (ref 78.0–100.0)
Platelets: 317 10*3/uL (ref 150–400)
RBC: 3.55 MIL/uL — AB (ref 3.87–5.11)
RDW: 16.6 % — AB (ref 11.5–15.5)
WBC: 9.9 10*3/uL (ref 4.0–10.5)

## 2015-07-20 LAB — GLUCOSE, CAPILLARY
GLUCOSE-CAPILLARY: 171 mg/dL — AB (ref 65–99)
GLUCOSE-CAPILLARY: 169 mg/dL — AB (ref 65–99)
Glucose-Capillary: 100 mg/dL — ABNORMAL HIGH (ref 65–99)
Glucose-Capillary: 146 mg/dL — ABNORMAL HIGH (ref 65–99)

## 2015-07-20 LAB — BASIC METABOLIC PANEL
ANION GAP: 6 (ref 5–15)
BUN: 18 mg/dL (ref 6–20)
CHLORIDE: 96 mmol/L — AB (ref 101–111)
CO2: 36 mmol/L — ABNORMAL HIGH (ref 22–32)
Calcium: 8.1 mg/dL — ABNORMAL LOW (ref 8.9–10.3)
Creatinine, Ser: 0.76 mg/dL (ref 0.44–1.00)
GFR calc Af Amer: >60 mL/min (ref >60)
GFR calc non Af Amer: >60 mL/min (ref >60)
Glucose, Bld: 148 mg/dL — ABNORMAL HIGH (ref 65–99)
POTASSIUM: 4.2 mmol/L (ref 3.5–5.1)
SODIUM: 138 mmol/L (ref 135–145)

## 2015-07-20 LAB — TSH: TSH: 0.398 u[IU]/mL (ref 0.350–4.500)

## 2015-07-20 MED ORDER — IPRATROPIUM-ALBUTEROL 0.5-2.5 (3) MG/3ML IN SOLN
3.0000 mL | Freq: Three times a day (TID) | RESPIRATORY_TRACT | Status: DC
  Administered 2015-07-21: 3 mL via RESPIRATORY_TRACT
  Filled 2015-07-20: qty 3

## 2015-07-20 MED ORDER — POLYSACCHARIDE IRON COMPLEX 150 MG PO CAPS
150.0000 mg | ORAL_CAPSULE | Freq: Every day | ORAL | Status: DC
  Administered 2015-07-20 – 2015-07-21 (×2): 150 mg via ORAL
  Filled 2015-07-20 (×2): qty 1

## 2015-07-20 NOTE — Progress Notes (Signed)
Subjective: This patient is an 81 year old female who was admitted yesterday with pneumonia. She has had cough and shortness of breath but no fever. She has had a normal white count twice. She has been treated with Levaquin. Her chest x-ray reveals bilateral patchy infiltrates.  Objective: Vital signs in last 24 hours: Filed Vitals:   07/19/15 1958 07/19/15 2149 07/20/15 0118 07/20/15 0646  BP:  127/50  153/62  Pulse:  83  80  Temp:  98.3 F (36.8 C)  97.9 F (36.6 C)  TempSrc:  Oral    Resp:    20  Height:      Weight:      SpO2: 94% 96% 96% 95%   Weight change:   Intake/Output Summary (Last 24 hours) at 07/20/15 0713 Last data filed at 07/19/15 1753  Gross per 24 hour  Intake   1070 ml  Output      0 ml  Net   1070 ml    Physical Exam: Alert. Complaining about wanting to go home. Multiple complaints about the hospital. Lungs reveal bilateral wheezes. Heart regular with a grade 1 systolic murmur. Abdomen is soft and nontender. Extremities reveal no edema.  Lab Results:    Results for orders placed or performed during the hospital encounter of 07/19/15 (from the past 24 hour(s))  Glucose, capillary     Status: Abnormal   Collection Time: 07/19/15 11:50 AM  Result Value Ref Range   Glucose-Capillary 175 (H) 65 - 99 mg/dL  Glucose, capillary     Status: Abnormal   Collection Time: 07/19/15  4:39 PM  Result Value Ref Range   Glucose-Capillary 128 (H) 65 - 99 mg/dL   Comment 1 Notify RN    Comment 2 Document in Chart   Glucose, capillary     Status: Abnormal   Collection Time: 07/19/15  9:47 PM  Result Value Ref Range   Glucose-Capillary 142 (H) 65 - 99 mg/dL   Comment 1 Notify RN    Comment 2 Document in Chart   Basic metabolic panel     Status: Abnormal   Collection Time: 07/20/15  5:32 AM  Result Value Ref Range   Sodium 138 135 - 145 mmol/L   Potassium 4.2 3.5 - 5.1 mmol/L   Chloride 96 (L) 101 - 111 mmol/L   CO2 36 (H) 22 - 32 mmol/L   Glucose, Bld 148 (H)  65 - 99 mg/dL   BUN 18 6 - 20 mg/dL   Creatinine, Ser 0.76 0.44 - 1.00 mg/dL   Calcium 8.1 (L) 8.9 - 10.3 mg/dL   GFR calc non Af Amer >60 >60 mL/min   GFR calc Af Amer >60 >60 mL/min   Anion gap 6 5 - 15  CBC     Status: Abnormal   Collection Time: 07/20/15  5:32 AM  Result Value Ref Range   WBC 9.9 4.0 - 10.5 K/uL   RBC 3.55 (L) 3.87 - 5.11 MIL/uL   Hemoglobin 9.1 (L) 12.0 - 15.0 g/dL   HCT 30.1 (L) 36.0 - 46.0 %   MCV 84.8 78.0 - 100.0 fL   MCH 25.6 (L) 26.0 - 34.0 pg   MCHC 30.2 30.0 - 36.0 g/dL   RDW 16.6 (H) 11.5 - 15.5 %   Platelets 317 150 - 400 K/uL     ABGS No results for input(s): PHART, PO2ART, TCO2, HCO3 in the last 72 hours.  Invalid input(s): PCO2 CULTURES No results found for this or any previous visit (from  the past 240 hour(s)). Studies/Results: Dg Chest 2 View  07/19/2015  CLINICAL DATA:  Acute onset of shortness of breath and wheezing. Initial encounter. EXAM: CHEST  2 VIEW COMPARISON:  Chest radiograph performed 11/13/2013, and CT of the chest performed 04/15/2015 FINDINGS: The lungs are well-aerated. A small left pleural effusion is noted. Vascular congestion is seen. Patchy bilateral airspace opacities may reflect pneumonia or mild interstitial edema. Mild peripheral scarring is noted at the left midlung zone. No pneumothorax is seen. The heart is borderline enlarged. No acute osseous abnormalities are seen. A right-sided chest port is noted ending about the mid SVC. IMPRESSION: Small left pleural effusion noted. Vascular congestion and borderline cardiomegaly. Patchy bilateral airspace opacities may reflect pneumonia or mild interstitial edema. Mild peripheral scarring at the left midlung zone. Electronically Signed   By: Garald Balding M.D.   On: 07/19/2015 05:51   Micro Results: No results found for this or any previous visit (from the past 240 hour(s)). Studies/Results: Dg Chest 2 View  07/19/2015  CLINICAL DATA:  Acute onset of shortness of breath and  wheezing. Initial encounter. EXAM: CHEST  2 VIEW COMPARISON:  Chest radiograph performed 11/13/2013, and CT of the chest performed 04/15/2015 FINDINGS: The lungs are well-aerated. A small left pleural effusion is noted. Vascular congestion is seen. Patchy bilateral airspace opacities may reflect pneumonia or mild interstitial edema. Mild peripheral scarring is noted at the left midlung zone. No pneumothorax is seen. The heart is borderline enlarged. No acute osseous abnormalities are seen. A right-sided chest port is noted ending about the mid SVC. IMPRESSION: Small left pleural effusion noted. Vascular congestion and borderline cardiomegaly. Patchy bilateral airspace opacities may reflect pneumonia or mild interstitial edema. Mild peripheral scarring at the left midlung zone. Electronically Signed   By: Garald Balding M.D.   On: 07/19/2015 05:51   Medications:  I have reviewed the patient's current medications Scheduled Meds: . antiseptic oral rinse  7 mL Mouth Rinse BID  . aspirin EC  81 mg Oral Daily  . atenolol  25 mg Oral Daily  . benzonatate  100 mg Oral TID  . enoxaparin (LOVENOX) injection  40 mg Subcutaneous Q24H  . fluticasone  2 spray Each Nare Daily  . furosemide  40 mg Oral BID  . insulin aspart  0-15 Units Subcutaneous TID WC  . insulin aspart  0-5 Units Subcutaneous QHS  . ipratropium-albuterol  3 mL Nebulization Q6H  . levofloxacin (LEVAQUIN) IV  750 mg Intravenous Q24H  . levothyroxine  75 mcg Oral QAC breakfast  . mesalamine  1,000 mg Oral QID  . methylPREDNISolone (SOLU-MEDROL) injection  40 mg Intravenous 3 times per day  . omega-3 acid ethyl esters  1 g Oral Daily  . pantoprazole  40 mg Oral Daily  . polyethylene glycol  17 g Oral Daily  . potassium chloride  10 mEq Oral Daily   Continuous Infusions: . 0.9 % NaCl with KCl 20 mEq / L 50 mL/hr at 07/19/15 1053   PRN Meds:.acetaminophen **OR** acetaminophen, albuterol, ALPRAZolam, guaiFENesin-dextromethorphan, lidocaine,  magnesium hydroxide, ondansetron **OR** ondansetron (ZOFRAN) IV, sodium chloride   Assessment/Plan: #1. Pneumonia. Continue Levaquin.  #2. COPD exacerbation. Continue oxygen, nebulizer treatments and Solu-Medrol. #3. History of metastatic carcinoma of the tongue. She's had no evidence of recurrent disease. #4. Chronic systolic heart failure. Continue Lasix. Stable. Principal Problem:   CAP (community acquired pneumonia) Active Problems:   Essential hypertension, benign   Coronary atherosclerosis of native coronary artery   GERD (gastroesophageal  reflux disease)   Anemia, iron deficiency   COPD with exacerbation ([place])   Chronic respiratory failure with hypoxia ([place])   Hypothyroidism     LOS: 1 day   Kilynn Fitzsimmons 07/20/2015, 7:13 AM

## 2015-07-20 NOTE — Care Management Note (Signed)
Case Management Note  Patient Details  Name: [name] MRN: [id number] Date of Birth: 1927/03/02  Subjective/Objective:                  Admitted for CAP. Pt is from home, lives alone, has aid M-F 5 hr/day. Pt's children help her on the weekends. Pt has home O2 (she uses only at nighttime but has port tanks and concentrator if she needs it during the day too). Pt has neb machine, cane, walker, hosp bed and BSC.   Action/Plan: Pt plans to return home with self care and aid. Pt has no CM needs noted at this time, will cont to follow.   Expected Discharge Date:  07/21/15               Expected Discharge Plan:  Home/Self Care  In-House Referral:  NA  Discharge planning Services  CM Consult  Post Acute Care Choice:  NA Choice offered to:  NA  DME Arranged:    DME Agency:     HH Arranged:    HH Agency:     Status of Service:  In process, will continue to follow  Medicare Important Message Given:    Date Medicare IM Given:    Medicare IM give by:    Date Additional Medicare IM Given:    Additional Medicare Important Message give by:     If discussed at [place] of Stay Meetings, dates discussed:    Additional Comments:  Sherald Barge, RN 07/20/2015, 10:29 AM

## 2015-07-21 ENCOUNTER — Other Ambulatory Visit (HOSPITAL_COMMUNITY): Payer: Self-pay | Admitting: Hematology & Oncology

## 2015-07-21 DIAGNOSIS — D509 Iron deficiency anemia, unspecified: Secondary | ICD-10-CM

## 2015-07-21 DIAGNOSIS — E611 Iron deficiency: Secondary | ICD-10-CM

## 2015-07-21 LAB — GLUCOSE, CAPILLARY: GLUCOSE-CAPILLARY: 136 mg/dL — AB (ref 65–99)

## 2015-07-21 MED ORDER — POLYSACCHARIDE IRON COMPLEX 150 MG PO CAPS: 150.0000 mg | ORAL_CAPSULE | Freq: Every day | ORAL | Status: AC

## 2015-07-21 MED ORDER — LEVOFLOXACIN 750 MG PO TABS: 750.0000 mg | ORAL_TABLET | Freq: Every day | ORAL | Status: DC

## 2015-07-21 MED ORDER — PREDNISONE 10 MG PO TABS: ORAL_TABLET | ORAL | Status: DC

## 2015-07-21 NOTE — Discharge Summary (Signed)
Physician Discharge Summary  [name] OJJ:[id number] DOB: April 14, 1927 DOA: 07/19/2015   Admit date: 07/19/2015 Discharge date: 07/21/2015  Discharge Diagnoses:  Principal Problem:   CAP (community acquired pneumonia) Active Problems:   Essential hypertension, benign   Coronary atherosclerosis of native coronary artery   GERD (gastroesophageal reflux disease)   Anemia, iron deficiency   COPD with exacerbation (HCC)   Chronic respiratory failure with hypoxia (HCC)   Hypothyroidism  chronic diastolic heart failure Crohn's disease   Wt Readings from Last 3 Encounters:  07/21/15 187 lb 2.7 oz (84.9 kg)  05/12/15 195 lb (88.451 kg)  05/06/15 195 lb (88.451 kg)     Hospital Course:  This patient is an 81 year old female who presented to the emergency room with difficulty breathing. She was afebrile and had a normal white count. Her chest x-ray revealed bilateral patchy infiltrates consistent with pneumonia. She was treated with antibiotic therapy with levofloxacin. She had coughing and wheezing. She was treated with Ventolin and Atrovent nebulizer treatments and intravenous Solu-Medrol. Her repeat white count was normal. Her symptoms significantly improved and she was felt to be stable for discharge on the morning of October 20. She will have follow-up in my office in one week. Levofloxacin will be continued to complete a 10 day course. Solu-Medrol is being modified to prednisone which she will gradually taper as directed. She will continue home nebulizer treatments 4 times a day and will continue home oxygen use.  She has chronic diastolic heart failure which has been stable on her current Lasix dose. She has Crohn's disease which has been stable on treatment.  She has iron deficiency anemia with a low ferritin. She is being treated with iron. She is up-to-date with her gastroenterology follow-up.  General condition is much improved. Exam on the morning of discharge reveals  diminished breath sounds but no current wheezing. Heart regular with no murmurs. Extremities reveal no edema.   Discharge Instructions     Medication List    STOP taking these medications        potassium chloride 10 MEQ tablet  Commonly known as:  K-DUR,KLOR-CON      TAKE these medications        ALPRAZolam 0.25 MG tablet  Commonly known as:  XANAX  Take 0.25 mg by mouth at bedtime as needed. For sleep.     aspirin 81 MG EC tablet  81 mg tablet; oral     atenolol 25 MG tablet  Commonly known as:  TENORMIN  TAKE (1) TABLET BY MOUTH ONCE DAILY.     calcium carbonate 500 MG chewable tablet  Commonly known as:  TUMS - dosed in mg elemental calcium  Chew 1 tablet by mouth 2 (two) times daily as needed for heartburn.     FISH OIL PO  Take 1 capsule by mouth daily.     fluticasone 50 MCG/ACT nasal spray  Commonly known as:  FLONASE  Place 2 sprays into the nose daily.     furosemide 40 MG tablet  Commonly known as:  LASIX  Take 40 mg by mouth 2 (two) times daily.     ipratropium 0.02 % nebulizer solution  Commonly known as:  ATROVENT  Take 250 mcg by nebulization 4 (four) times daily.     iron polysaccharides 150 MG capsule  Commonly known as:  NIFEREX  Take 1 capsule (150 mg total) by mouth daily.     levofloxacin 750 MG tablet  Commonly known as:  LEVAQUIN  Take  1 tablet (750 mg total) by mouth daily.     levothyroxine 75 MCG tablet  Commonly known as:  SYNTHROID, LEVOTHROID  Take 75 mcg by mouth every morning.     lidocaine 5 %  Commonly known as:  LIDODERM  Place 1 patch onto the skin daily as needed (for pain). Remove & Discard patch within 12 hours or as directed by MD. Pain.     magnesium hydroxide 400 MG/5ML suspension  Commonly known as:  MILK OF MAGNESIA  Take 30 mLs by mouth as needed for mild constipation.     MYLANTA PO  Take 15 mLs by mouth as needed (acid reflux).     pantoprazole 40 MG tablet  Commonly known as:  PROTONIX  Take 1  tablet (40 mg total) by mouth daily.     PENTASA 250 MG CR capsule  Generic drug:  mesalamine  TAKE 4 CAPSULES 4 TIMES DAILY.     polyethylene glycol packet  Commonly known as:  MIRALAX / GLYCOLAX  Take 17 g by mouth daily.     potassium chloride 10 MEQ tablet  Commonly known as:  K-DUR  Take 1 tablet (10 mEq total) by mouth daily.     predniSONE 10 MG tablet  Commonly known as:  DELTASONE  4 a day for 3 days, 3 a day for 3 days,2 a day for 3 days, 1 a day for 3 days, one half a day for 3 days     PROAIR HFA 108 (90 BASE) MCG/ACT inhaler  Generic drug:  albuterol  Inhale 2 puffs into the lungs every 6 (six) hours as needed for wheezing or shortness of breath.     albuterol (2.5 MG/3ML) 0.083% nebulizer solution  Commonly known as:  PROVENTIL  Take 2.5 mg by nebulization 4 (four) times daily.     ranitidine 150 MG tablet  Commonly known as:  ZANTAC  Take 1 tablet (150 mg total) by mouth at bedtime.         [name] 07/21/2015

## 2015-07-21 NOTE — Care Management Note (Signed)
Case Management Note  Patient Details  Name: [name] MRN: [id number] Date of Birth: Feb 09, 1927  Subjective/Objective:                    Action/Plan: Pt discharging home today with self care and CAP aid. Pt has home O2 and necessary DME PTA. Pt has no CM needs noted at this time.   Expected Discharge Date:  07/21/15               Expected Discharge Plan:  Home/Self Care  In-House Referral:  NA  Discharge planning Services  CM Consult  Post Acute Care Choice:  NA Choice offered to:  NA  DME Arranged:    DME Agency:     HH Arranged:    HH Agency:     Status of Service:  In process, will continue to follow  Medicare Important Message Given:  N/A - LOS <3 / Initial given by admissions Date Medicare IM Given:    Medicare IM give by:    Date Additional Medicare IM Given:    Additional Medicare Important Message give by:     If discussed at [place] of Stay Meetings, dates discussed:    Additional Comments:  Sherald Barge, RN 07/21/2015, 9:43 AM

## 2015-07-21 NOTE — Progress Notes (Signed)
Patient being discharged home with instructions given on medications,and follow up visits,patient verbalized understanding. Prescriptions sent to Pharmacy of choice documented on AVS. Accompanied by staff to an awaiting vehicle.

## 2015-07-21 NOTE — Care Management Important Message (Signed)
Important Message  Patient Details  Name: Redina K Martinique MRN: [id number] Date of Birth: [birth year]   Medicare Important Message Given:  N/A - LOS <3 / Initial given by admissions    Sherald Barge, RN 07/21/2015, 9:43 AM

## 2015-07-26 ENCOUNTER — Encounter (HOSPITAL_COMMUNITY): Payer: Medicare Other | Attending: Internal Medicine

## 2015-07-26 VITALS — BP 150/60 | HR 64 | Temp 97.7°F | Resp 18 | Wt 191.6 lb

## 2015-07-26 DIAGNOSIS — D509 Iron deficiency anemia, unspecified: Secondary | ICD-10-CM

## 2015-07-26 DIAGNOSIS — C3401 Malignant neoplasm of right main bronchus: Secondary | ICD-10-CM | POA: Insufficient documentation

## 2015-07-26 DIAGNOSIS — E890 Postprocedural hypothyroidism: Secondary | ICD-10-CM | POA: Insufficient documentation

## 2015-07-26 DIAGNOSIS — E611 Iron deficiency: Secondary | ICD-10-CM | POA: Insufficient documentation

## 2015-07-26 DIAGNOSIS — C091 Malignant neoplasm of tonsillar pillar (anterior) (posterior): Secondary | ICD-10-CM | POA: Insufficient documentation

## 2015-07-26 MED ORDER — SODIUM CHLORIDE 0.9 % IV SOLN
510.0000 mg | INTRAVENOUS | Status: DC
  Administered 2015-07-26: 510 mg via INTRAVENOUS
  Filled 2015-07-26: qty 17

## 2015-07-26 MED ORDER — SODIUM CHLORIDE 0.9 % IV SOLN
INTRAVENOUS | Status: DC
  Administered 2015-07-26: 14:00:00 via INTRAVENOUS

## 2015-07-26 MED ORDER — HEPARIN SOD (PORK) LOCK FLUSH 100 UNIT/ML IV SOLN
500.0000 [IU] | Freq: Once | INTRAVENOUS | Status: AC
Start: 2015-07-26 — End: 2015-07-26
  Administered 2015-07-26: 500 [IU] via INTRAVENOUS

## 2015-07-26 MED ORDER — HEPARIN SOD (PORK) LOCK FLUSH 100 UNIT/ML IV SOLN
INTRAVENOUS | Status: AC
  Filled 2015-07-26: qty 5

## 2015-07-26 NOTE — Patient Instructions (Signed)
[place] at [facility] Discharge Instructions  RECOMMENDATIONS MADE BY THE CONSULTANT AND ANY TEST RESULTS WILL BE SENT TO YOUR REFERRING PHYSICIAN.  You received IV Iron today.   Please call 911 to be transferred to the ER if you feel any worse or have any confusion.  Remember that if you have any infection that is unresolved, it can lead to critical illness.    Thank you for choosing [place] at [facility] to provide your oncology and hematology care.  To afford each patient quality time with our provider, please arrive at least 15 minutes before your scheduled appointment time.    You need to re-schedule your appointment should you arrive 10 or more minutes late.  We strive to give you quality time with our providers, and arriving late affects you and other patients whose appointments are after yours.  Also, if you no show three or more times for appointments you may be dismissed from the clinic at the providers discretion.     Again, thank you for choosing [facility].  Our hope is that these requests will decrease the amount of time that you wait before being seen by our physicians.       _____________________________________________________________  Should you have questions after your visit to [facility], please contact our office at [phone] between the hours of 8:30 a.m. and 4:30 p.m.  Voicemails left after 4:30 p.m. will not be returned until the following business day.  For prescription refill requests, have your pharmacy contact our office.

## 2015-07-26 NOTE — Progress Notes (Signed)
Patient tolerated infusion well.  VSS post infusion.  Confusion noted during appointment.  MD made aware and came in to see the patient.  Dr.  Muse recommended that the patient go to the Emergency Room for evaluation.  The patient refused to go.  I reported additional instances of confusion, such as: feeling that her head was wet and when inspected, it was dry; asking to go the bathroom and not being able to urinate; and speaking of areas in her head that open up.  Dr.  Muse continues to recommend that the patient go to the ER.  Patient was wheeled out to the main entrance where she had valet parked her car.

## 2015-08-02 ENCOUNTER — Encounter (HOSPITAL_COMMUNITY): Payer: Medicare Other

## 2015-08-02 ENCOUNTER — Ambulatory Visit (HOSPITAL_COMMUNITY): Payer: Medicare Other

## 2015-08-09 ENCOUNTER — Encounter (HOSPITAL_COMMUNITY): Payer: Medicare Other | Attending: Internal Medicine

## 2015-08-09 VITALS — BP 97/56 | HR 61 | Temp 98.0°F | Resp 18

## 2015-08-09 DIAGNOSIS — C3401 Malignant neoplasm of right main bronchus: Secondary | ICD-10-CM | POA: Insufficient documentation

## 2015-08-09 DIAGNOSIS — E611 Iron deficiency: Secondary | ICD-10-CM | POA: Insufficient documentation

## 2015-08-09 DIAGNOSIS — E890 Postprocedural hypothyroidism: Secondary | ICD-10-CM | POA: Insufficient documentation

## 2015-08-09 DIAGNOSIS — D509 Iron deficiency anemia, unspecified: Secondary | ICD-10-CM | POA: Diagnosis present

## 2015-08-09 DIAGNOSIS — C091 Malignant neoplasm of tonsillar pillar (anterior) (posterior): Secondary | ICD-10-CM | POA: Insufficient documentation

## 2015-08-09 MED ORDER — SODIUM CHLORIDE 0.9 % IV SOLN
510.0000 mg | Freq: Once | INTRAVENOUS | Status: AC
  Administered 2015-08-09: 510 mg via INTRAVENOUS
  Filled 2015-08-09: qty 17

## 2015-08-09 MED ORDER — HEPARIN SOD (PORK) LOCK FLUSH 100 UNIT/ML IV SOLN
500.0000 [IU] | Freq: Once | INTRAVENOUS | Status: AC
  Administered 2015-08-09: 500 [IU] via INTRAVENOUS

## 2015-08-09 MED ORDER — HEPARIN SOD (PORK) LOCK FLUSH 100 UNIT/ML IV SOLN
INTRAVENOUS | Status: AC
  Filled 2015-08-09: qty 5

## 2015-08-09 MED ORDER — SODIUM CHLORIDE 0.9 % IV SOLN
INTRAVENOUS | Status: DC
  Administered 2015-08-09: 13:00:00 via INTRAVENOUS

## 2015-08-09 NOTE — Patient Instructions (Signed)
[place] at [facility] Discharge Instructions  RECOMMENDATIONS MADE BY THE CONSULTANT AND ANY TEST RESULTS WILL BE SENT TO YOUR REFERRING PHYSICIAN.  You received IV Iron today.    Thank you for choosing [place] at [facility] to provide your oncology and hematology care.  To afford each patient quality time with our provider, please arrive at least 15 minutes before your scheduled appointment time.    You need to re-schedule your appointment should you arrive 10 or more minutes late.  We strive to give you quality time with our providers, and arriving late affects you and other patients whose appointments are after yours.  Also, if you no show three or more times for appointments you may be dismissed from the clinic at the providers discretion.     Again, thank you for choosing [facility].  Our hope is that these requests will decrease the amount of time that you wait before being seen by our physicians.       _____________________________________________________________  Should you have questions after your visit to [facility], please contact our office at [phone] between the hours of 8:30 a.m. and 4:30 p.m.  Voicemails left after 4:30 p.m. will not be returned until the following business day.  For prescription refill requests, have your pharmacy contact our office.

## 2015-08-09 NOTE — Progress Notes (Signed)
Patient tolerated infusion well.  VSS post infusion.

## 2015-08-30 ENCOUNTER — Encounter (HOSPITAL_COMMUNITY): Payer: Medicare Other

## 2015-09-09 ENCOUNTER — Ambulatory Visit: Payer: Medicare Other | Admitting: Cardiology

## 2015-09-14 ENCOUNTER — Encounter (HOSPITAL_COMMUNITY): Payer: Medicare Other

## 2015-09-14 ENCOUNTER — Ambulatory Visit (HOSPITAL_COMMUNITY): Payer: Medicare Other | Admitting: Hematology & Oncology

## 2015-09-22 ENCOUNTER — Encounter (HOSPITAL_COMMUNITY): Payer: Medicare Other | Attending: Internal Medicine

## 2015-09-22 ENCOUNTER — Encounter (HOSPITAL_COMMUNITY): Payer: Self-pay

## 2015-09-22 VITALS — BP 131/56 | HR 59 | Temp 97.9°F | Resp 20

## 2015-09-22 DIAGNOSIS — E611 Iron deficiency: Secondary | ICD-10-CM | POA: Insufficient documentation

## 2015-09-22 DIAGNOSIS — E890 Postprocedural hypothyroidism: Secondary | ICD-10-CM | POA: Insufficient documentation

## 2015-09-22 DIAGNOSIS — C760 Malignant neoplasm of head, face and neck: Secondary | ICD-10-CM

## 2015-09-22 DIAGNOSIS — C3401 Malignant neoplasm of right main bronchus: Secondary | ICD-10-CM | POA: Insufficient documentation

## 2015-09-22 DIAGNOSIS — Z95828 Presence of other vascular implants and grafts: Secondary | ICD-10-CM

## 2015-09-22 DIAGNOSIS — Z452 Encounter for adjustment and management of vascular access device: Secondary | ICD-10-CM | POA: Diagnosis present

## 2015-09-22 DIAGNOSIS — C091 Malignant neoplasm of tonsillar pillar (anterior) (posterior): Secondary | ICD-10-CM | POA: Insufficient documentation

## 2015-09-22 DIAGNOSIS — D509 Iron deficiency anemia, unspecified: Secondary | ICD-10-CM | POA: Insufficient documentation

## 2015-09-22 MED ORDER — SODIUM CHLORIDE 0.9 % IJ SOLN: 10.0000 mL | INTRAMUSCULAR | Status: DC | PRN

## 2015-09-22 MED ORDER — HEPARIN SOD (PORK) LOCK FLUSH 100 UNIT/ML IV SOLN: 500.0000 [IU] | Freq: Once | INTRAVENOUS | Status: DC

## 2015-09-22 NOTE — Patient Instructions (Signed)
[place] at [facility] Discharge Instructions  RECOMMENDATIONS MADE BY THE CONSULTANT AND ANY TEST RESULTS WILL BE SENT TO YOUR REFERRING PHYSICIAN.  Port flush done today. Follow up at your next appointment  Thank you for choosing [place] at [facility] to provide your oncology and hematology care.  To afford each patient quality time with our provider, please arrive at least 15 minutes before your scheduled appointment time.    You need to re-schedule your appointment should you arrive 10 or more minutes late.  We strive to give you quality time with our providers, and arriving late affects you and other patients whose appointments are after yours.  Also, if you no show three or more times for appointments you may be dismissed from the clinic at the providers discretion.     Again, thank you for choosing [facility].  Our hope is that these requests will decrease the amount of time that you wait before being seen by our physicians.       _____________________________________________________________  Should you have questions after your visit to [facility], please contact our office at [phone] between the hours of 8:30 a.m. and 4:30 p.m.  Voicemails left after 4:30 p.m. will not be returned until the following business day.  For prescription refill requests, have your pharmacy contact our office.

## 2015-09-22 NOTE — Progress Notes (Signed)
[name] presented for Portacath access and flush. Portacath located right chest wall accessed with  H 20 needle. Good blood return present. Portacath flushed with 20ml NS and 500U/5ml Heparin and needle removed intact. Procedure without incident. Patient tolerated procedure well.   

## 2015-10-05 ENCOUNTER — Encounter (HOSPITAL_COMMUNITY): Payer: Medicare Other | Attending: Internal Medicine | Admitting: Hematology & Oncology

## 2015-10-05 DIAGNOSIS — E890 Postprocedural hypothyroidism: Secondary | ICD-10-CM | POA: Insufficient documentation

## 2015-10-05 DIAGNOSIS — C7800 Secondary malignant neoplasm of unspecified lung: Secondary | ICD-10-CM

## 2015-10-05 DIAGNOSIS — M810 Age-related osteoporosis without current pathological fracture: Secondary | ICD-10-CM | POA: Diagnosis not present

## 2015-10-05 DIAGNOSIS — C760 Malignant neoplasm of head, face and neck: Secondary | ICD-10-CM | POA: Diagnosis present

## 2015-10-05 DIAGNOSIS — M545 Low back pain: Secondary | ICD-10-CM

## 2015-10-05 DIAGNOSIS — K5090 Crohn's disease, unspecified, without complications: Secondary | ICD-10-CM | POA: Diagnosis not present

## 2015-10-05 DIAGNOSIS — D649 Anemia, unspecified: Secondary | ICD-10-CM

## 2015-10-05 DIAGNOSIS — D509 Iron deficiency anemia, unspecified: Secondary | ICD-10-CM | POA: Insufficient documentation

## 2015-10-05 DIAGNOSIS — C091 Malignant neoplasm of tonsillar pillar (anterior) (posterior): Secondary | ICD-10-CM | POA: Insufficient documentation

## 2015-10-05 DIAGNOSIS — E611 Iron deficiency: Secondary | ICD-10-CM | POA: Insufficient documentation

## 2015-10-05 DIAGNOSIS — C3401 Malignant neoplasm of right main bronchus: Secondary | ICD-10-CM | POA: Insufficient documentation

## 2015-10-05 DIAGNOSIS — C3492 Malignant neoplasm of unspecified part of left bronchus or lung: Secondary | ICD-10-CM

## 2015-10-05 MED ORDER — AZITHROMYCIN 250 MG PO TABS: ORAL_TABLET | ORAL | Status: DC

## 2015-10-05 NOTE — Progress Notes (Signed)
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 Arlyce Dice   05/09/2005 - 08/29/2005 Chemotherapy Carboplatin/Taxol/Erbitux x 6 cycles    Definitive Surgery 06/2007 wedge resection   03/12/2008 - 03/22/2008 Radiation Therapy SBRT 45 Gy x 5 fractions to LLL lesion and 32.5 Gy x 5 fractions to additional LLL   09/20/2008 - 09/20/2008 Radiation Therapy SBRT 26 Gy in 1 fraction to left lung fissue lesion   01/17/2010 - 01/17/2010 Radiation Therapy SBRT 22 Gy in 1 fraction of RLL lesion    CURRENT THERAPY: Surveillance per NCCN guidelines  INTERVAL HISTORY: [name] 82 y.o. female returns for regular visit for followup of metastatic head and neck cancer to lungs, S/P resection, SBRT, and systemic chemotherapy.  Last SBRT was in 2011. (See details in onchx above)  She has osteoporosis. She has Crohn's disease.  [name] returns to the [place] alone today. She is in a wheelchair, and was recently hospitalized for pneumonia, which she is still getting over. She states that, about 10 days before Christmas, "I ain't never been much sicker." She remarks that "they would have called it the croup back in my day." She states that after she'd been in the hospital, Dr. Willey Blade gave her an antibiotic to take for 5 days, a generic Z-pack. She says "it worked fine, but after it wore off it got worse than it's ever been."  To alleviate her symptoms, she says "I do my nebulizer at least 4 times a day and that helps." She did it before she came to her appointment today.  She is going to see Dr. Willey Blade again on Monday.  In general, [name] expresses distaste with her inability to get around and do the  things she'd like to do. She says, "look, I'm old and I'm 29 and I'm hateful sometimes, I'm impatient sometimes ... I'm fortunate that I'm alive, but since I am, I want to be able to do things."  She lives by herself, and states "but I have help in the morning."   Past Medical History  Diagnosis Date  . Essential hypertension, benign   . Crohn's disease ([place])   . Spinal stenosis   . PSVT (paroxysmal supraventricular tachycardia) ([place])   . History of TIAs   . GERD (gastroesophageal reflux disease)   . Carotid artery disease ([place])     RICA 50-69% 12/09  . Myocardial infarction ([place])   . COPD (chronic obstructive pulmonary disease) ([place])     02 dependent  . Thyroid mass   . Incontinence of urine   . Anxiety   . Arthritis   . Atrial fibrillation (HCC)     Postoperative - suboptimal Coumadin candidate  . CAD (coronary artery disease), native coronary artery   . Port-a-cath in place 01/22/2012  . Lung cancer (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 Diverticulosis 05/2015    Per colonoscopy  . Rectal polyp 05/2015    Per colonoscopy    has Carotid artery disease ([place]); Essential hypertension, benign; Coronary atherosclerosis of native coronary artery;  GERD (gastroesophageal reflux disease); COPD (chronic obstructive pulmonary disease) ([place]); Mixed hyperlipidemia; Port-a-cath in place; Dysphagia; Bilateral leg edema; COPD exacerbation ([place]); Nausea with vomiting; Diarrhea; Oral pharyngeal candidiasis; Lung cancer ([place]); Cancer of tonsillar pillars (anterior) (posterior) ([place]); Anemia, iron deficiency; Iron deficiency; CAP (community acquired pneumonia); COPD with exacerbation ([place]); Chronic respiratory failure with hypoxia ([place]); Bronchospasm; and Hypothyroidism on her problem list.     is allergic to codeine; demerol; morphine and related; and penicillins.  Ms.  [name] had no medications administered during this visit.  Past Surgical History  Procedure Laterality Date  . Left thoracotomy with wedge resection  2008    Left lower lobe  . Right tonsillectomy  2005  . Left thyroidectomy  2006  . Left breast biopsy  2011  . US echocardiography    . Cholecystectomy    . Cardiovascular stress test    . Abdominal hysterectomy    . Thyroidectomy  08/30/2011    Procedure: THYROIDECTOMY;  Surgeon: Cecil Cranker;  Location: [facility] OR;  Service: ENT;  Laterality: Right;  . Esophagogastroduodenoscopy (egd) with esophageal dilation N/A 05/06/2013    Procedure: ESOPHAGOGASTRODUODENOSCOPY (EGD) WITH ESOPHAGEAL DILATION;  Surgeon: Rogene [place], MD;  Location: [facility] ENDO SUITE;  Service: Endoscopy;  Laterality: N/A;  230  . Esophagogastroduodenoscopy N/A 04/27/2014    Procedure: ESOPHAGOGASTRODUODENOSCOPY (EGD);  Surgeon: Rogene [place], MD;  Location: [facility] ENDO SU[facility];  Service: Endoscopy;  Laterality: N/A;  1230  . Balloon dilation N/A 04/27/2014    Procedure: BALLOON DILATION;  Surgeon: Rogene [place], MD;  Location: [facility];  Service: Endoscopy;  Laterality: N/A;  Venia Minks dilation N/A 04/27/2014    Procedure: Venia Minks DILATION;  Surgeon: Rogene [place], MD;  Location: [facility];  Service: Endoscopy;  Laterality: N/A;  . Savory dilation N/A 04/27/2014    Procedure: SAVORY DILATION;  Surgeon: Rogene [place], MD;  Location: [facility];  Service: Endoscopy;  Laterality: N/A;  . Esophagogastroduodenoscopy N/A 02/25/2015    Procedure: ESOPHAGOGASTRODUODENOSCOPY (EGD);  Surgeon: Rogene [place], MD;  [facility]: [facility];  Service: Endoscopy;  Laterality: N/A;  1155  . Esophageal dilation N/A 02/25/2015    Procedure: ESOPHAGEAL DILATION;  Surgeon: Rogene [place], MD;  [facility]: [facility];  Service: Endoscopy;  Laterality: N/A;  . Colonoscopy N/A 05/06/2015    Procedure: COLONOSCOPY;  Surgeon: Rogene [place], MD;  Location: [facility];  Service:  Endoscopy;  Laterality: N/A;  1255    REVIEW OF SYSTEMS Denies any headaches, dizziness, double vision, fevers, chills, night sweats, nausea, vomiting, diarrhea, constipation, chest pain, heart palpitations, shortness of breath, blood in stool, black tarry stool, urinary pain, urinary burning, urinary frequency, hematuria. Positive for hearing loss, neck pain, and back pain.  14 point review of systems was performed and is negative except as detailed under history of present illness and above   PHYSICAL EXAMINATION ECOG PERFORMANCE STATUS: 3 - Symptomatic, >50% confined to bed  There were no vitals filed for this visit.  GENERAL:alert, no distress, well nourished, well developed, comfortable, cooperative and smiling SKIN: skin color, texture, turgor are normal, no rashes or significant lesions HEAD: Normocephalic, No masses, lesions, tenderness or abnormalities EYES: normal, PERRLA, EOMI, Conjunctiva are pink and non-injected EARS: External ears normal OROPHARYNX:lips, buccal mucosa, and tongue normal  NECK: supple, no adenopathy, no stridor, non-tender, trachea midline  Right neck tension LYMPH:  no palpable lymphadenopathy BREAST:not examined LUNGS: clear to auscultation, diminished breaths sounds  Coarse rhonchi in  the right lung HEART: regular rate & rhythm, no murmurs and no gallops ABDOMEN:abdomen soft, non-tender, obese and normal bowel sounds BACK: Back symmetric, no curvature. EXTREMITIES:less then 2 second capillary refill, no cyanosis, positive findings:  edema B/L 2+ pitting edema   Bilateral lower extremity swelling mild, minimal to no erythema NEURO: alert & oriented x 3 with fluent speech, no focal motor/sensory deficits, in wheelchair   LABORATORY DATA: I have reviewed the data as listed  CBC    Component Value Date/Time   WBC 9.9 07/20/2015 0532   RBC 3.55* 07/20/2015 0532   HGB 9.1* 07/20/2015 0532   HCT 30.1* 07/20/2015 0532   PLT 317 07/20/2015 0532    MCV 84.8 07/20/2015 0532   MCH 25.6* 07/20/2015 0532   MCHC 30.2 07/20/2015 0532   RDW 16.6* 07/20/2015 0532   LYMPHSABS 0.9 07/19/2015 0430   MONOABS 0.6 07/19/2015 0430   EOSABS 0.2 07/19/2015 0430   BASOSABS 0.0 07/19/2015 0430      Chemistry      Component Value Date/Time   NA 138 07/20/2015 0532   K 4.2 07/20/2015 0532   CL 96* 07/20/2015 0532   CO2 36* 07/20/2015 0532   BUN 18 07/20/2015 0532   CREATININE 0.76 07/20/2015 0532   CREATININE 0.79 02/15/2015 1545      Component Value Date/Time   CALCIUM 8.1* 07/20/2015 0532   ALKPHOS 82 07/19/2015 0430   AST 15 07/19/2015 0430   ALT 10* 07/19/2015 0430   BILITOT 0.4 07/19/2015 0430     RADIOLOGY: CLINICAL DATA: Acute onset of shortness of breath and wheezing. Initial encounter.  EXAM: CHEST 2 VIEW  COMPARISON: Chest radiograph performed 11/13/2013, and CT of the chest performed 04/15/2015  FINDINGS: The lungs are well-aerated. A small left pleural effusion is noted. Vascular congestion is seen. Patchy bilateral airspace opacities may reflect pneumonia or mild interstitial edema. Mild peripheral scarring is noted at the left midlung zone. No pneumothorax is seen.  The heart is borderline enlarged. No acute osseous abnormalities are seen. A right-sided chest port is noted ending about the mid SVC.  IMPRESSION: Small left pleural effusion noted. Vascular congestion and borderline cardiomegaly. Patchy bilateral airspace opacities may reflect pneumonia or mild interstitial edema. Mild peripheral scarring at the left midlung zone.   Electronically Signed  By: Garald Balding M.D.  On: 07/19/2015 05:51    ASSESSMENT:  1. 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  2. H/O of tonsillar cancer S/P resection and adjuvant radiation therapy in 2005 by Dr. Nila Nephew. 3. Crohns disease, followed by Dr. Laural Golden 4.Low Back Pain, chronic 5. Anemia,  intermittent 6. Hospitalization on 07/19/2015 through 07/21/2015 secondary to CAP  THERAPY PLAN:   [name] will return in 6 months, and she can see Tom for follow-up. She will need a CBC and ferritin drawn every 3 months. We will follow her iron levels closely. She agrees to labs today prior to departure from the clinic.   I have given her another course of zithromax. She has f/u with Dr. Willey Blade on Monday.   In terms of her dissatisfaction with her activity levels, ability to socialize, and this quality of life, I recommend that she look in to visiting the brand new senior center. She is somewhat open to this. We provided her with contact information for the senior center and an activity list.   Orders Placed This Encounter  Procedures  . CBC with Differential    Standing  Status: Standing     Number of Occurrences: 4     Standing Expiration Date: 10/04/2016  . Ferritin    Standing Status: Standing     Number of Occurrences: 4     Standing Expiration Date: 10/04/2016   All questions were answered. The patient knows to call the clinic with any problems, questions or concerns. We can certainly see the patient much sooner if necessary.  This document serves as a record of services personally performed by Ancil Linsey, MD. It was created on her behalf by Toni Amend, a trained medical scribe. The creation of this record is based on the scribe's personal observations and the provider's statements to them. This document has been checked and approved by the attending provider.  I have reviewed the above documentation for accuracy and completeness, and I agree with the above.   Molli Hazard, MD

## 2015-10-05 NOTE — Patient Instructions (Addendum)
[place] at [facility] Discharge Instructions  RECOMMENDATIONS MADE BY THE CONSULTANT AND ANY TEST RESULTS WILL BE SENT TO YOUR REFERRING PHYSICIAN.  You will return in 6 months for follow up with the doctor.   We will draw labs every 3 months.   We sent in an antibiotic to the pharmacy.     Thank you for choosing [place] at [facility] to provide your oncology and hematology care.  To afford each patient quality time with our provider, please arrive at least 15 minutes before your scheduled appointment time.    You need to re-schedule your appointment should you arrive 10 or more minutes late.  We strive to give you quality time with our providers, and arriving late affects you and other patients whose appointments are after yours.  Also, if you no show three or more times for appointments you may be dismissed from the clinic at the providers discretion.     Again, thank you for choosing [facility].  Our hope is that these requests will decrease the amount of time that you wait before being seen by our physicians.       _____________________________________________________________  Should you have questions after your visit to [facility], please contact our office at [phone] between the hours of 8:30 a.m. and 4:30 p.m.  Voicemails left after 4:30 p.m. will not be returned until the following business day.  For prescription refill requests, have your pharmacy contact our office.

## 2015-10-07 ENCOUNTER — Encounter (HOSPITAL_COMMUNITY): Payer: Self-pay | Admitting: Hematology & Oncology

## 2015-10-18 ENCOUNTER — Ambulatory Visit (INDEPENDENT_AMBULATORY_CARE_PROVIDER_SITE_OTHER): Payer: Medicare Other | Admitting: Cardiology

## 2015-10-18 ENCOUNTER — Telehealth (HOSPITAL_COMMUNITY): Payer: Self-pay | Admitting: *Deleted

## 2015-10-18 ENCOUNTER — Encounter: Payer: Self-pay | Admitting: Cardiology

## 2015-10-18 ENCOUNTER — Other Ambulatory Visit (HOSPITAL_COMMUNITY): Payer: Self-pay | Admitting: Oncology

## 2015-10-18 VITALS — BP 118/58 | HR 64 | Ht 65.0 in | Wt 186.0 lb

## 2015-10-18 DIAGNOSIS — I10 Essential (primary) hypertension: Secondary | ICD-10-CM | POA: Diagnosis not present

## 2015-10-18 DIAGNOSIS — I6523 Occlusion and stenosis of bilateral carotid arteries: Secondary | ICD-10-CM

## 2015-10-18 DIAGNOSIS — I2510 Atherosclerotic heart disease of native coronary artery without angina pectoris: Secondary | ICD-10-CM | POA: Diagnosis not present

## 2015-10-18 NOTE — Telephone Encounter (Signed)
Nursing, please call in Diflucan 200 mg day 1 and then 100 mg PO thereafter x 3 days.  #5, 0 refills.  Yamaris Cummings, PA-C 10/18/2015 4:00 PM

## 2015-10-18 NOTE — Patient Instructions (Signed)
Your physician wants you to follow-up in: 6 Months with Dr. McDowell.  You will receive a reminder letter in the mail two months in advance. If you don't receive a letter, please call our office to schedule the follow-up appointment.  Your physician recommends that you continue on your current medications as directed. Please refer to the Current Medication list given to you today.  If you need a refill on your cardiac medications before your next appointment, please call your pharmacy.  Thank you for choosing [facility]! '  

## 2015-10-18 NOTE — Progress Notes (Signed)
Cardiology Office Note  Date: 10/18/2015   ID: [name], DOB Dec 13, 1926, MRN [id number]  PCP: [name], MD  Primary Cardiologist: Rozann Lesches, MD   Chief Complaint  Patient presents with  . Coronary Artery Disease  . History of atrial arrhythmias    History of Present Illness: [name] is a medically complex 82 y.o. female last seen in March 2016. She presents for a routine follow-up visit. I reviewed her interval records. From a cardiac perspective, she does not report any angina symptoms or significant palpitations. She mostly focuses on other symptoms and complaints during the encounter today.  I reviewed her most recent ECG from October 2016, outlined below. We discussed her medications. She continues on aspirin, atenolol, Lasix, and potassium supplements. Her weight is down 5 pounds compared to October 2016.  Past Medical History  Diagnosis Date  . Essential hypertension, benign   . Crohn's disease ([place])   . Spinal stenosis   . PSVT (paroxysmal supraventricular tachycardia) ([place])   . History of TIAs   . GERD (gastroesophageal reflux disease)   . Carotid artery disease ([place])     RICA 50-69% 12/09  . Myocardial infarction ([place])   . COPD (chronic obstructive pulmonary disease) ([place])     02 dependent  . Thyroid mass   . Incontinence of urine   . Anxiety   . Arthritis   . Atrial fibrillation (HCC)     Postoperative - suboptimal Coumadin candidate  . CAD (coronary artery disease), native coronary artery   . Port-a-cath in place 01/22/2012  . 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 Diverticulosis   . Rectal polyp     Current Outpatient Prescriptions  Medication Sig Dispense Refill  . albuterol (PROVENTIL) (2.5 MG/3ML) 0.083% nebulizer solution Take 2.5 mg by nebulization 4 (four) times daily.    Marland Kitchen ALPRAZolam  (XANAX) 0.25 MG tablet Take 0.25 mg by mouth at bedtime as needed. For sleep.    Marland Kitchen aspirin 81 MG EC tablet 81 mg tablet; oral    . atenolol (TENORMIN) 25 MG tablet TAKE (1) TABLET BY MOUTH ONCE DAILY. 30 tablet 6  . Calcium & Magnesium Carbonates (MYLANTA PO) Take 15 mLs by mouth as needed (acid reflux). Reported on 10/05/2015    . calcium carbonate (TUMS - DOSED IN MG ELEMENTAL CALCIUM) 500 MG chewable tablet Chew 1 tablet by mouth 2 (two) times daily as needed for heartburn.     . fluticasone (FLONASE) 50 MCG/ACT nasal spray Place 2 sprays into the nose daily.      . furosemide (LASIX) 40 MG tablet Take 40 mg by mouth 2 (two) times daily.     Marland Kitchen ipratropium (ATROVENT) 0.02 % nebulizer solution Take 250 mcg by nebulization 4 (four) times daily.    . iron polysaccharides (NIFEREX) 150 MG capsule Take 1 capsule (150 mg total) by mouth daily. 30 capsule 4  . levothyroxine (SYNTHROID, LEVOTHROID) 75 MCG tablet Take 75 mcg by mouth every morning.    . lidocaine (LIDODERM) 5 % Place 1 patch onto the skin daily as needed (for pain). Remove & Discard patch within 12 hours or as directed by MD. Pain.    . magnesium hydroxide (MILK OF MAGNESIA) 400 MG/5ML suspension Take 30 mLs by mouth as needed for mild constipation.    . Omega-3 Fatty Acids (  FISH OIL PO) Take 1 capsule by mouth daily.    . pantoprazole (PROTONIX) 40 MG tablet Take 1 tablet (40 mg total) by mouth daily. 30 tablet 5  . PENTASA 250 MG CR capsule TAKE 4 CAPSULES 4 TIMES DAILY. 480 capsule 3  . potassium chloride (K-DUR) 10 MEQ tablet Take 1 tablet (10 mEq total) by mouth daily. 90 tablet 1  . PROAIR HFA 108 (90 BASE) MCG/ACT inhaler Inhale 2 puffs into the lungs every 6 (six) hours as needed for wheezing or shortness of breath.      No current facility-administered medications for this visit.   Allergies:  Codeine; Demerol; Morphine and related; and Penicillins   Social History: The patient  reports that she quit smoking about 22 years ago.  Her smoking use included Cigarettes. She has never used smokeless tobacco. She reports that she does not drink alcohol or use illicit drugs.   ROS:  Please see the history of present illness. Otherwise, complete review of systems is positive for seasonal allergies with chest congestion and rhinorrhea, vague superficial left abdominal discomfort, chronic pain, uses a walker to ambulate.  All other systems are reviewed and negative.   Physical Exam: VS:  BP 118/58 mmHg  Pulse 64  Ht '5\' 5"'$  (1.651 m)  Wt 186 lb (84.369 kg)  BMI 30.95 kg/m2  SpO2 92%, BMI Body mass index is 30.95 kg/(m^2).  Wt Readings from Last 3 Encounters:  10/18/15 186 lb (84.369 kg)  07/26/15 191 lb 9.6 oz (86.909 kg)  07/21/15 187 lb 2.7 oz (84.9 kg)    Overweight woman in no acute distress. Using walker.  HEENT: Conjuctivae and lids normal, oropharynx clear.  Neck: Supple, no JVD or obvious bruit.  Lungs: Clear, decreased breath sounds at bases.  Cardiac: Regular rate and rhythm, distant, soft systolic murmur, no S3.  Abdomen: Soft, NABS.  Skin: Warm and dry.  Extremities: Venous stasis and spider veins, right worse than left, 1+ edema.   ECG: Tracing from 07/16/2015 showed sinus rhythm with increased voltage.  Recent Labwork: 07/19/2015: ALT 10*; AST 15; B Natriuretic Peptide 203.0* 07/20/2015: BUN 18; Creatinine, Ser 0.76; Hemoglobin 9.1*; Platelets 317; Potassium 4.2; Sodium 138; TSH 0.398   Other Studies Reviewed Today:  Carotid Dopplers 06/02/2014: IMPRESSION: There is now 50-69% stenosis in the bilateral internal carotid arteries. This is compatible with progression since the prior study.  Echocardiogram 04/16/2013: Study Conclusions  - Left ventricle: The cavity size was normal. There was moderate concentric hypertrophy. Systolic function was normal. The estimated ejection fraction was 55%. Probable hypokinesis of the basal-midinferolateral myocardium. Doppler parameters are  consistent with abnormal left ventricular relaxation (grade 1 diastolic dysfunction). Borderline increased filling pressures. - Aortic valve: Mildly calcified annulus. Trileaflet; moderately calcified leaflets. - Mitral valve: Calcified annulus. Trivial regurgitation. - Left atrium: The atrium was mildly dilated. - Right ventricle: The cavity size was normal. Wall thickness was mildly increased. - Tricuspid valve: Trivial regurgitation. - Pulmonary arteries: Systolic pressure could not be accurately estimated. - Inferior vena cava: Not well visualized. The vessel was dilated. Inable to provide CVP estimate, but suspect elevated to some degree. - Pericardium, extracardiac: A prominent pericardial fat pad was present. - Impressions: Comparison made to prior study from August 2013. There is moderate LVH with LVEF approximately 55%, inferolateral hypokinesis noted. Grade 1 diastolic dysfunction with borderline increased filling pressures. Mild left atrial enlargement. Unable to assess PASP, suspect elevated CVP basedon limitedviews. Impressions:  - Comparison made to prior study from August  2013. There is moderate LVH with LVEF approximately 55%, inferolateral hypokinesis noted. Grade 1 diastolic dysfunction with borderline increased filling pressures. Mild left atrial enlargement. Unable to assess PASP, suspect elevated CVP basedon limited views.  Assessment and Plan:  1. Symptomatic stable CAD with preserved LVEF. Continue medical therapy and observation. No changes were made today. I reviewed her most recent ECG.  2. Moderate bilateral carotid artery disease, managed conservatively at this time.  3. Essential hypertension, blood pressure is normal today.  Current medicines were reviewed with the patient today.  Disposition: FU with me in 6 months.   Signed, Satira Sark, MD, [facility] 10/18/2015 11:28 AM    [place] at [facility] [address] [place], [place] [address] Phone: [phone]; Fax: [phone]

## 2015-10-19 ENCOUNTER — Other Ambulatory Visit: Payer: Self-pay | Admitting: Cardiology

## 2015-11-09 ENCOUNTER — Encounter (HOSPITAL_COMMUNITY): Payer: Medicare Other

## 2015-11-25 ENCOUNTER — Encounter (HOSPITAL_COMMUNITY): Payer: Medicare Other | Attending: Internal Medicine

## 2015-11-25 VITALS — BP 139/46 | HR 70 | Temp 97.8°F | Resp 20

## 2015-11-25 DIAGNOSIS — E890 Postprocedural hypothyroidism: Secondary | ICD-10-CM | POA: Insufficient documentation

## 2015-11-25 DIAGNOSIS — D509 Iron deficiency anemia, unspecified: Secondary | ICD-10-CM | POA: Insufficient documentation

## 2015-11-25 DIAGNOSIS — C091 Malignant neoplasm of tonsillar pillar (anterior) (posterior): Secondary | ICD-10-CM | POA: Diagnosis present

## 2015-11-25 DIAGNOSIS — C760 Malignant neoplasm of head, face and neck: Secondary | ICD-10-CM | POA: Diagnosis not present

## 2015-11-25 DIAGNOSIS — Z452 Encounter for adjustment and management of vascular access device: Secondary | ICD-10-CM

## 2015-11-25 DIAGNOSIS — E611 Iron deficiency: Secondary | ICD-10-CM | POA: Diagnosis not present

## 2015-11-25 DIAGNOSIS — C3401 Malignant neoplasm of right main bronchus: Secondary | ICD-10-CM | POA: Diagnosis present

## 2015-11-25 DIAGNOSIS — Z95828 Presence of other vascular implants and grafts: Secondary | ICD-10-CM

## 2015-11-25 LAB — CBC WITH DIFFERENTIAL/PLATELET
BASOS ABS: 0 10*3/uL (ref 0.0–0.1)
Basophils Relative: 0 %
EOS PCT: 3 %
Eosinophils Absolute: 0.2 10*3/uL (ref 0.0–0.7)
HEMATOCRIT: 28.5 % — AB (ref 36.0–46.0)
Hemoglobin: 8.7 g/dL — ABNORMAL LOW (ref 12.0–15.0)
LYMPHS PCT: 22 %
Lymphs Abs: 1.2 10*3/uL (ref 0.7–4.0)
MCH: 26.9 pg (ref 26.0–34.0)
MCHC: 30.5 g/dL (ref 30.0–36.0)
MCV: 88 fL (ref 78.0–100.0)
MONO ABS: 0.4 10*3/uL (ref 0.1–1.0)
MONOS PCT: 8 %
NEUTROS ABS: 3.8 10*3/uL (ref 1.7–7.7)
Neutrophils Relative %: 67 %
PLATELETS: 317 10*3/uL (ref 150–400)
RBC: 3.24 MIL/uL — ABNORMAL LOW (ref 3.87–5.11)
RDW: 16.3 % — AB (ref 11.5–15.5)
WBC: 5.6 10*3/uL (ref 4.0–10.5)

## 2015-11-25 LAB — FERRITIN: Ferritin: 8 ng/mL — ABNORMAL LOW (ref 11–307)

## 2015-11-25 MED ORDER — HEPARIN SOD (PORK) LOCK FLUSH 100 UNIT/ML IV SOLN
INTRAVENOUS | Status: AC
  Filled 2015-11-25: qty 5

## 2015-11-25 MED ORDER — SODIUM CHLORIDE 0.9% FLUSH
10.0000 mL | Freq: Once | INTRAVENOUS | Status: AC
  Administered 2015-11-25: 10 mL via INTRAVENOUS

## 2015-11-25 MED ORDER — HEPARIN SOD (PORK) LOCK FLUSH 100 UNIT/ML IV SOLN
500.0000 [IU] | Freq: Once | INTRAVENOUS | Status: AC
  Administered 2015-11-25: 500 [IU] via INTRAVENOUS

## 2015-11-25 NOTE — Progress Notes (Signed)
[name] presented for Portacath access and flush. Proper placement of portacath confirmed by CXR. Portacath located right chest wall accessed with  H 20 needle. Good blood return present. Portacath flushed with 70m NS and 500U/520mHeparin and needle removed intact. Procedure without incident. Patient tolerated procedure well.

## 2015-11-25 NOTE — Patient Instructions (Signed)
[place] at [facility] Discharge Instructions  RECOMMENDATIONS MADE BY THE CONSULTANT AND ANY TEST RESULTS WILL BE SENT TO YOUR REFERRING PHYSICIAN.  Port flush with lab work today as ordered. We will call you if there are any abnormal results. Return as scheduled.  Thank you for choosing [place] at [facility] to provide your oncology and hematology care.  To afford each patient quality time with our provider, please arrive at least 15 minutes before your scheduled appointment time.   Beginning January 23rd 2017 lab work for the [facility] will be done in the  Main lab at [facility] on 1st floor. If you have a lab appointment with the [facility] please come in thru the  Main Entrance and check in at the main information desk  You need to re-schedule your appointment should you arrive 10 or more minutes late.  We strive to give you quality time with our providers, and arriving late affects you and other patients whose appointments are after yours.  Also, if you no show three or more times for appointments you may be dismissed from the clinic at the providers discretion.     Again, thank you for choosing [facility].  Our hope is that these requests will decrease the amount of time that you wait before being seen by our physicians.       _____________________________________________________________  Should you have questions after your visit to [facility], please contact our office at [phone] between the hours of 8:30 a.m. and 4:30 p.m.  Voicemails left after 4:30 p.m. will not be returned until the following business day.  For prescription refill requests, have your pharmacy contact our office.

## 2015-11-29 ENCOUNTER — Other Ambulatory Visit (HOSPITAL_COMMUNITY): Payer: Self-pay | Admitting: Oncology

## 2015-11-29 DIAGNOSIS — E611 Iron deficiency: Secondary | ICD-10-CM

## 2015-12-01 ENCOUNTER — Encounter (HOSPITAL_COMMUNITY): Payer: Medicare Other

## 2015-12-06 ENCOUNTER — Emergency Department (HOSPITAL_COMMUNITY): Payer: Medicare Other

## 2015-12-06 ENCOUNTER — Emergency Department (HOSPITAL_COMMUNITY)
Admission: EM | Admit: 2015-12-06 | Discharge: 2015-12-06 | Disposition: A | Discharge status: Home / Self Care | Payer: Medicare Other | Attending: Emergency Medicine | Admitting: Emergency Medicine

## 2015-12-06 ENCOUNTER — Encounter (HOSPITAL_COMMUNITY): Payer: Self-pay | Admitting: *Deleted

## 2015-12-06 ENCOUNTER — Ambulatory Visit (HOSPITAL_COMMUNITY): Payer: Medicare Other

## 2015-12-06 DIAGNOSIS — M4800 Spinal stenosis, site unspecified: Secondary | ICD-10-CM

## 2015-12-06 DIAGNOSIS — Z85118 Personal history of other malignant neoplasm of bronchus and lung: Secondary | ICD-10-CM | POA: Insufficient documentation

## 2015-12-06 DIAGNOSIS — Z79899 Other long term (current) drug therapy: Secondary | ICD-10-CM | POA: Diagnosis not present

## 2015-12-06 DIAGNOSIS — D509 Iron deficiency anemia, unspecified: Secondary | ICD-10-CM | POA: Diagnosis not present

## 2015-12-06 DIAGNOSIS — J449 Chronic obstructive pulmonary disease, unspecified: Secondary | ICD-10-CM | POA: Insufficient documentation

## 2015-12-06 DIAGNOSIS — I4891 Unspecified atrial fibrillation: Secondary | ICD-10-CM | POA: Diagnosis not present

## 2015-12-06 DIAGNOSIS — I2510 Atherosclerotic heart disease of native coronary artery without angina pectoris: Secondary | ICD-10-CM | POA: Insufficient documentation

## 2015-12-06 DIAGNOSIS — M79605 Pain in left leg: Secondary | ICD-10-CM | POA: Diagnosis present

## 2015-12-06 DIAGNOSIS — Z7982 Long term (current) use of aspirin: Secondary | ICD-10-CM | POA: Insufficient documentation

## 2015-12-06 DIAGNOSIS — Z87891 Personal history of nicotine dependence: Secondary | ICD-10-CM | POA: Diagnosis not present

## 2015-12-06 DIAGNOSIS — I10 Essential (primary) hypertension: Secondary | ICD-10-CM | POA: Diagnosis not present

## 2015-12-06 DIAGNOSIS — I252 Old myocardial infarction: Secondary | ICD-10-CM | POA: Insufficient documentation

## 2015-12-06 LAB — CBC WITH DIFFERENTIAL/PLATELET
BASOS ABS: 0 10*3/uL (ref 0.0–0.1)
Basophils Relative: 1 %
Eosinophils Absolute: 0.2 10*3/uL (ref 0.0–0.7)
Eosinophils Relative: 3 %
HEMATOCRIT: 26.5 % — AB (ref 36.0–46.0)
HEMOGLOBIN: 8.1 g/dL — AB (ref 12.0–15.0)
LYMPHS PCT: 21 %
Lymphs Abs: 1.2 10*3/uL (ref 0.7–4.0)
MCH: 26.2 pg (ref 26.0–34.0)
MCHC: 30.6 g/dL (ref 30.0–36.0)
MCV: 85.8 fL (ref 78.0–100.0)
Monocytes Absolute: 0.5 10*3/uL (ref 0.1–1.0)
Monocytes Relative: 9 %
NEUTROS ABS: 3.8 10*3/uL (ref 1.7–7.7)
NEUTROS PCT: 66 %
Platelets: 320 10*3/uL (ref 150–400)
RBC: 3.09 MIL/uL — AB (ref 3.87–5.11)
RDW: 16.7 % — ABNORMAL HIGH (ref 11.5–15.5)
WBC: 5.7 10*3/uL (ref 4.0–10.5)

## 2015-12-06 LAB — COMPREHENSIVE METABOLIC PANEL
ALT: 10 U/L — AB (ref 14–54)
AST: 19 U/L (ref 15–41)
Albumin: 3.3 g/dL — ABNORMAL LOW (ref 3.5–5.0)
Alkaline Phosphatase: 72 U/L (ref 38–126)
Anion gap: 5 (ref 5–15)
BILIRUBIN TOTAL: 0.6 mg/dL (ref 0.3–1.2)
BUN: 17 mg/dL (ref 6–20)
CO2: 35 mmol/L — ABNORMAL HIGH (ref 22–32)
CREATININE: 0.96 mg/dL (ref 0.44–1.00)
Calcium: 8.3 mg/dL — ABNORMAL LOW (ref 8.9–10.3)
Chloride: 97 mmol/L — ABNORMAL LOW (ref 101–111)
GFR calc Af Amer: >60 mL/min (ref >60)
GFR, EST NON AFRICAN AMERICAN: 52 mL/min — AB (ref >60)
Glucose, Bld: 90 mg/dL (ref 65–99)
Potassium: 4.2 mmol/L (ref 3.5–5.1)
Sodium: 137 mmol/L (ref 135–145)
TOTAL PROTEIN: 6.4 g/dL — AB (ref 6.5–8.1)

## 2015-12-06 MED ORDER — FENTANYL CITRATE (PF) 100 MCG/2ML IJ SOLN
12.5000 ug | Freq: Once | INTRAMUSCULAR | Status: AC
  Administered 2015-12-06: 12.5 ug via INTRAVENOUS
  Filled 2015-12-06: qty 2

## 2015-12-06 MED ORDER — TRAMADOL HCL 50 MG PO TABS: 50.0000 mg | ORAL_TABLET | Freq: Four times a day (QID) | ORAL | Status: AC | PRN

## 2015-12-06 MED ORDER — ONDANSETRON HCL 4 MG/2ML IJ SOLN
4.0000 mg | Freq: Once | INTRAMUSCULAR | Status: AC
  Administered 2015-12-06: 4 mg via INTRAVENOUS
  Filled 2015-12-06: qty 2

## 2015-12-06 NOTE — ED Notes (Signed)
Pt reports "is not coming back for iron infusion this afternoon." pt inquired about rescheduling appointment for this am. Contacted Amy, in cancer center, and reported that patient could come 12/07/15 at 1400. Pt aware and verbalized understanding of new appointment time. Amy contacted to confirm new appointment time.

## 2015-12-06 NOTE — ED Provider Notes (Addendum)
CSN: [id number]     Arrival date & time 12/06/15  0539 History   First MD Initiated Contact with Patient 12/06/15 0544     Chief Complaint  Patient presents with  . Leg Pain     (Consider location/radiation/quality/duration/timing/severity/associated sxs/prior Treatment) HPI Comments: Patient presents to the emergency department for evaluation of leg pain. Patient reports that she has a history of spinal stenosis and has chronic pain in her left leg. She reports, however, that the pain is usually only in the lower part of the leg near the ankle. For the last 12 hours she has had pain with numbness and tingling all the way up to her left knee. She denies any fall or injury.  Patient is a 82 y.o. female presenting with leg pain.  Leg Pain Associated symptoms: back pain     Past Medical History  Diagnosis Date  . Essential hypertension, benign   . Crohn's disease ([place])   . Spinal stenosis   . PSVT (paroxysmal supraventricular tachycardia) ([place])   . History of TIAs   . GERD (gastroesophageal reflux disease)   . Carotid artery disease ([place])     RICA 50-69% 12/09  . Myocardial infarction ([place])   . COPD (chronic obstructive pulmonary disease) ([place])     02 dependent  . Thyroid mass   . Incontinence of urine   . Anxiety   . Arthritis   . Atrial fibrillation (HCC)     Postoperative - suboptimal Coumadin candidate  . CAD (coronary artery disease), native coronary artery   . Port-a-cath in place 01/22/2012  . 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 Diverticulosis   . Rectal polyp    Past Surgical History  Procedure Laterality Date  . Left thoracotomy with wedge resection  2008    Left lower lobe  . Right tonsillectomy  2005  . Left thyroidectomy  2006  . Left breast biopsy  2011  . US echocardiography    . Cholecystectomy    . Cardiovascular  stress test    . Abdominal hysterectomy    . Thyroidectomy  08/30/2011    Procedure: THYROIDECTOMY;  Surgeon: Cecil Cranker;  Location: [facility] OR;  Service: ENT;  Laterality: Right;  . Esophagogastroduodenoscopy (egd) with esophageal dilation N/A 05/06/2013    Procedure: ESOPHAGOGASTRODUODENOSCOPY (EGD) WITH ESOPHAGEAL DILATION;  Surgeon: Rogene Houston, MD;  Location: [facility] ENDO SUITE;  Service: Endoscopy;  Laterality: N/A;  230  . Esophagogastroduodenoscopy N/A 04/27/2014    Procedure: ESOPHAGOGASTRODUODENOSCOPY (EGD);  Surgeon: Rogene Houston, MD;  Location: [facility] ENDO SUITE;  Service: Endoscopy;  Laterality: N/A;  1230  . Balloon dilation N/A 04/27/2014    Procedure: BALLOON DILATION;  Surgeon: Rogene Houston, MD;  Location: [facility] ENDO SUITE;  Service: Endoscopy;  Laterality: N/A;  Venia Minks dilation N/A 04/27/2014    Procedure: Venia Minks DILATION;  Surgeon: Rogene Houston, MD;  Location: [facility] ENDO SUITE;  Service: Endoscopy;  Laterality: N/A;  . Savory dilation N/A 04/27/2014    Procedure: SAVORY DILATION;  Surgeon: Rogene Houston, MD;  Location: [facility] ENDO SU[facility];  Service: Endoscopy;  Laterality: N/A;  . Esophagogastroduodenoscopy N/A 02/25/2015    Procedure: ESOPHAGOGASTRODUODENOSCOPY (EGD);  Surgeon: Rogene Houston, MD;  Location: [facility];  Service: Endoscopy;  Laterality: N/A;  1155  . Esophageal dilation N/A 02/25/2015  Procedure: ESOPHAGEAL DILATION;  Surgeon: Rogene Houston, MD;  Location: [facility];  Service: Endoscopy;  Laterality: N/A;  . Colonoscopy N/A 05/06/2015    Procedure: COLONOSCOPY;  Surgeon: Rogene Houston, MD;  Location: [facility] END[facility];  Service: Endoscopy;  Laterality: N/A;  1255   Family History  Problem Relation Age of Onset  . Coronary artery disease Father     Died age 26  . Dementia Mother    Social History  Substance Use Topics  . Smoking status: Former Smoker    Types: Cigarettes    Quit date: 10/20/1993  . Smokeless tobacco: Never Used  . Alcohol Use: No   OB  History    No data available     Review of Systems  Musculoskeletal: Positive for back pain.  Neurological: Positive for numbness.  All other systems reviewed and are negative.     Allergies  Codeine; Demerol; Morphine and related; and Penicillins  Home Medications   Prior to Admission medications   Medication Sig Start Date End Date Taking? Authorizing Provider  albuterol (PROVENTIL) (2.5 MG/3ML) 0.083% nebulizer solution Take 2.5 mg by nebulization 4 (four) times daily.    Historical Provider, MD  ALPRAZolam Duanne Moron) 0.25 MG tablet Take 0.25 mg by mouth at bedtime as needed. For sleep.    Historical Provider, MD  aspirin 81 MG EC tablet 81 mg tablet; oral 04/08/12   Historical Provider, MD  atenolol (TENORMIN) 25 MG tablet TAKE (1) TABLET BY MOUTH ONCE DAILY. 10/19/15   Satira Sark, MD  Calcium & Magnesium Carbonates (MYLANTA PO) Take 15 mLs by mouth as needed (acid reflux). Reported on 10/05/2015    Historical Provider, MD  calcium carbonate (TUMS - DOSED IN MG ELEMENTAL CALCIUM) 500 MG chewable tablet Chew 1 tablet by mouth 2 (two) times daily as needed for heartburn.     Historical Provider, MD  fluticasone (FLONASE) 50 MCG/ACT nasal spray Place 2 sprays into the nose daily.      Historical Provider, MD  furosemide (LASIX) 40 MG tablet Take 40 mg by mouth 2 (two) times daily.  04/16/13   Satira Sark, MD  ipratropium (ATROVENT) 0.02 % nebulizer solution Take 250 mcg by nebulization 4 (four) times daily.    Historical Provider, MD  iron polysaccharides (NIFEREX) 150 MG capsule Take 1 capsule (150 mg total) by mouth daily. 07/21/15   [name], MD  levothyroxine (SYNTHROID, LEVOTHROID) 75 MCG tablet Take 75 mcg by mouth every morning.    Historical Provider, MD  lidocaine (LIDODERM) 5 % Place 1 patch onto the skin daily as needed (for pain). Remove & Discard patch within 12 hours or as directed by MD. Pain.    Historical Provider, MD  magnesium hydroxide (MILK OF MAGNESIA) 400  MG/5ML suspension Take 30 mLs by mouth as needed for mild constipation.    Historical Provider, MD  Omega-3 Fatty Acids (FISH OIL PO) Take 1 capsule by mouth daily.    Historical Provider, MD  pantoprazole (PROTONIX) 40 MG tablet Take 1 tablet (40 mg total) by mouth daily. 04/27/14   Rogene Houston, MD  PENTASA 250 MG CR capsule TAKE 4 CAPSULES 4 TIMES DAILY. 02/10/15   Butch Penny, NP  potassium chloride (K-DUR) 10 MEQ tablet Take 1 tablet (10 mEq total) by mouth daily. 04/16/13   Satira Sark, MD  PROAIR HFA 108 (90 BASE) MCG/ACT inhaler Inhale 2 puffs into the lungs every 6 (six) hours as needed for wheezing or  shortness of breath.  07/10/12   Historical Provider, MD   There were no vitals taken for this visit. Physical Exam  Constitutional: She is oriented to person, place, and time. She appears well-developed and well-nourished. No distress.  HENT:  Head: Normocephalic and atraumatic.  Right Ear: Hearing normal.  Left Ear: Hearing normal.  Nose: Nose normal.  Mouth/Throat: Oropharynx is clear and moist and mucous membranes are normal.  Eyes: Conjunctivae and EOM are normal. Pupils are equal, round, and reactive to light.  Neck: Normal range of motion. Neck supple.  Cardiovascular: Regular rhythm, S1 normal and S2 normal.  Exam reveals no gallop and no friction rub.   No murmur heard. Pulmonary/Chest: Effort normal and breath sounds normal. No respiratory distress. She exhibits no tenderness.  Abdominal: Soft. Normal appearance and bowel sounds are normal. There is no hepatosplenomegaly. There is no tenderness. There is no rebound, no guarding, no tenderness at McBurney's point and negative Murphy's sign. No hernia.  Musculoskeletal: Normal range of motion.       Left lower leg: She exhibits tenderness. She exhibits no swelling.  Neurological: She is alert and oriented to person, place, and time. She has normal strength. No cranial nerve deficit or sensory deficit. Coordination  normal. GCS eye subscore is 4. GCS verbal subscore is 5. GCS motor subscore is 6.  Skin: Skin is warm, dry and intact. No rash noted. No cyanosis.  Psychiatric: She has a normal mood and affect. Her speech is normal and behavior is normal. Thought content normal.  Nursing note and vitals reviewed.   ED Course  Procedures (including critical care time) Labs Review Labs Reviewed  CBC WITH DIFFERENTIAL/PLATELET  COMPREHENSIVE METABOLIC PANEL    Imaging Review No results found. I have personally reviewed and evaluated these images and lab results as part of my medical decision-making.   EKG Interpretation None      MDM   Final diagnoses:  None  spinal stenosis Iron deficiency anemia  Patient presents to the ER for evaluation of left leg pain. Patient reports that she is feeling pain, cramping in the lower leg that has been ongoing for 12 hours. She does have a history of spinal stenosis and has had some chronic issues of pain in this leg secondary to her spinal stenosis. Symptoms are worse than usual and extend further up the leg, now all the way to the knee. It is felt that this pain is secondary to her spinal stenosis, but will obtain venous ultrasound to rule out DVT as well. X-rays obtained as well. Patient initiated on fentanyl for analgesia here in the ER. I do not appreciate any neurologic deficit that would warrant emergent spinal imaging. Patient anemic. She has a known history of bulimia and is scheduled for iron infusion later today by hematology. Case will be signed out to oncoming ER physician to follow-up on venous evaluation, anticipate discharge.    Orpah Greek, MD 12/06/15 [place], MD 12/06/15 [phone]

## 2015-12-06 NOTE — ED Notes (Signed)
Pt c/o pain to left leg; pt states she feels like her left leg is numb and feels like "it's drawing in"

## 2015-12-06 NOTE — Discharge Instructions (Signed)
Spinal Stenosis Spinal stenosis is an abnormal narrowing of the canals of your spine (vertebrae). CAUSES  Spinal stenosis is caused by areas of bone pushing into the central canals of your vertebrae. This condition can be present at birth (congenital). It also may be caused by arthritic deterioration of your vertebrae (spinal degeneration).  SYMPTOMS   Pain that is generally worse with activities, particularly standing and walking.  Numbness, tingling, hot or cold sensations, weakness, or weariness in your legs.  Frequent episodes of falling.  A foot-slapping gait that leads to muscle weakness. DIAGNOSIS  Spinal stenosis is diagnosed with the use of magnetic resonance imaging (MRI) or computed tomography (CT). TREATMENT  Initial therapy for spinal stenosis focuses on the management of the pain and other symptoms associated with the condition. These therapies include:  Practicing postural changes to lessen pressure on your nerves.  Exercises to strengthen the core of your body.  Loss of excess body weight.  The use of nonsteroidal anti-inflammatory medicines to reduce swelling and inflammation in your nerves. When therapies to manage pain are not successful, surgery to treat spinal stenosis may be recommended. This surgery involves removing excess bone, which puts pressure on your nerve roots. During this surgery (laminectomy), the posterior boney arch (lamina) and excess bone around the facet joints are removed.   This information is not intended to replace advice given to you by your health care provider. Make sure you discuss any questions you have with your health care provider.   Document Released: 12/08/2003 Document Revised: 10/08/2014 Document Reviewed: 12/26/2012 Elsevier Interactive Patient Education 2016 Elsevier Inc.  

## 2015-12-06 NOTE — ED Notes (Signed)
Pt reporting pain and numbness in left leg. Skin warm and dry.  Pedal pulse palpable.  Cap refill < 3 seconds.

## 2015-12-07 ENCOUNTER — Encounter (HOSPITAL_COMMUNITY): Payer: Medicare Other | Attending: Internal Medicine

## 2015-12-07 VITALS — BP 112/42 | HR 63 | Temp 98.9°F | Resp 20

## 2015-12-07 DIAGNOSIS — E890 Postprocedural hypothyroidism: Secondary | ICD-10-CM | POA: Insufficient documentation

## 2015-12-07 DIAGNOSIS — E611 Iron deficiency: Secondary | ICD-10-CM | POA: Insufficient documentation

## 2015-12-07 DIAGNOSIS — C3401 Malignant neoplasm of right main bronchus: Secondary | ICD-10-CM | POA: Insufficient documentation

## 2015-12-07 DIAGNOSIS — D509 Iron deficiency anemia, unspecified: Secondary | ICD-10-CM | POA: Diagnosis not present

## 2015-12-07 DIAGNOSIS — C091 Malignant neoplasm of tonsillar pillar (anterior) (posterior): Secondary | ICD-10-CM | POA: Insufficient documentation

## 2015-12-07 MED ORDER — SODIUM CHLORIDE 0.9 % IV SOLN
INTRAVENOUS | Status: DC
  Administered 2015-12-07: 14:00:00 via INTRAVENOUS

## 2015-12-07 MED ORDER — HEPARIN SOD (PORK) LOCK FLUSH 100 UNIT/ML IV SOLN
INTRAVENOUS | Status: AC
  Filled 2015-12-07: qty 5

## 2015-12-07 MED ORDER — SODIUM CHLORIDE 0.9 % IV SOLN
510.0000 mg | Freq: Once | INTRAVENOUS | Status: AC
  Administered 2015-12-07: 510 mg via INTRAVENOUS
  Filled 2015-12-07: qty 17

## 2015-12-07 MED ORDER — SODIUM CHLORIDE 0.9% FLUSH
10.0000 mL | Freq: Once | INTRAVENOUS | Status: AC
  Administered 2015-12-07: 10 mL via INTRAVENOUS

## 2015-12-07 MED ORDER — HEPARIN SOD (PORK) LOCK FLUSH 100 UNIT/ML IV SOLN
500.0000 [IU] | Freq: Once | INTRAVENOUS | Status: AC
  Administered 2015-12-07: 500 [IU] via INTRAVENOUS

## 2015-12-07 NOTE — Progress Notes (Signed)
Tolerated iron infusion well. Stable on discharge home to self via wheelchair.

## 2015-12-07 NOTE — Patient Instructions (Signed)
[place] at [facility] Discharge Instructions  RECOMMENDATIONS MADE BY THE CONSULTANT AND ANY TEST RESULTS WILL BE SENT TO YOUR REFERRING PHYSICIAN.  Feraheme 510 mg iron infusion given today as ordered.  Thank you for choosing [place] at [facility] to provide your oncology and hematology care.  To afford each patient quality time with our provider, please arrive at least 15 minutes before your scheduled appointment time.   Beginning January 23rd 2017 lab work for the [facility] will be done in the  Main lab at [facility] on 1st floor. If you have a lab appointment with the [facility] please come in thru the  Main Entrance and check in at the main information desk  You need to re-schedule your appointment should you arrive 10 or more minutes late.  We strive to give you quality time with our providers, and arriving late affects you and other patients whose appointments are after yours.  Also, if you no show three or more times for appointments you may be dismissed from the clinic at the providers discretion.     Again, thank you for choosing [facility].  Our hope is that these requests will decrease the amount of time that you wait before being seen by our physicians.       _____________________________________________________________  Should you have questions after your visit to [facility], please contact our office at [phone] between the hours of 8:30 a.m. and 4:30 p.m.  Voicemails left after 4:30 p.m. will not be returned until the following business day.  For prescription refill requests, have your pharmacy contact our office.         Resources For Cancer Patients and their Caregivers ? American Cancer Society: Can assist with transportation, wigs, general needs, runs Look Good Feel Better.        414-086-3176 ? Cancer Care: Provides financial assistance, online support groups,  medication/co-pay assistance.  1-800-813-HOPE (901)208-4590) ? [place] Assists [place] cancer patients and their families through emotional , educational and financial support.  [phone] ? [place] DSS Where to apply for food stamps, Medicaid and utility assistance. 773-548-5543 ? [employer]: Transportation to medical appointments. 314-140-9293 ? Social Security Administration: May apply for disability if have a Stage IV cancer. [phone] 951-552-1635 ? [employer], Disability and Transit Services: Assists with nutrition, care and transit needs. [phone]

## 2015-12-19 ENCOUNTER — Other Ambulatory Visit: Payer: Self-pay | Admitting: Cardiology

## 2016-01-04 ENCOUNTER — Other Ambulatory Visit (HOSPITAL_COMMUNITY): Payer: Medicare Other

## 2016-01-06 ENCOUNTER — Other Ambulatory Visit (HOSPITAL_COMMUNITY): Payer: Medicare Other

## 2016-01-10 ENCOUNTER — Encounter (HOSPITAL_COMMUNITY): Payer: Medicare Other | Attending: Internal Medicine

## 2016-01-10 VITALS — BP 100/44 | HR 62 | Temp 98.7°F | Resp 20

## 2016-01-10 DIAGNOSIS — C760 Malignant neoplasm of head, face and neck: Secondary | ICD-10-CM | POA: Diagnosis not present

## 2016-01-10 DIAGNOSIS — C091 Malignant neoplasm of tonsillar pillar (anterior) (posterior): Secondary | ICD-10-CM | POA: Insufficient documentation

## 2016-01-10 DIAGNOSIS — E611 Iron deficiency: Secondary | ICD-10-CM | POA: Diagnosis not present

## 2016-01-10 DIAGNOSIS — C3401 Malignant neoplasm of right main bronchus: Secondary | ICD-10-CM | POA: Diagnosis present

## 2016-01-10 DIAGNOSIS — Z452 Encounter for adjustment and management of vascular access device: Secondary | ICD-10-CM

## 2016-01-10 DIAGNOSIS — D509 Iron deficiency anemia, unspecified: Secondary | ICD-10-CM | POA: Diagnosis present

## 2016-01-10 DIAGNOSIS — E890 Postprocedural hypothyroidism: Secondary | ICD-10-CM | POA: Insufficient documentation

## 2016-01-10 DIAGNOSIS — Z95828 Presence of other vascular implants and grafts: Secondary | ICD-10-CM

## 2016-01-10 LAB — CBC WITH DIFFERENTIAL/PLATELET
BASOS PCT: 0 %
Basophils Absolute: 0 10*3/uL (ref 0.0–0.1)
EOS ABS: 0.1 10*3/uL (ref 0.0–0.7)
EOS PCT: 2 %
HCT: 31.7 % — ABNORMAL LOW (ref 36.0–46.0)
Hemoglobin: 9.8 g/dL — ABNORMAL LOW (ref 12.0–15.0)
LYMPHS ABS: 0.9 10*3/uL (ref 0.7–4.0)
Lymphocytes Relative: 15 %
MCH: 27.1 pg (ref 26.0–34.0)
MCHC: 30.9 g/dL (ref 30.0–36.0)
MCV: 87.8 fL (ref 78.0–100.0)
MONO ABS: 0.4 10*3/uL (ref 0.1–1.0)
Monocytes Relative: 8 %
Neutro Abs: 4.4 10*3/uL (ref 1.7–7.7)
Neutrophils Relative %: 75 %
Platelets: 276 10*3/uL (ref 150–400)
RBC: 3.61 MIL/uL — ABNORMAL LOW (ref 3.87–5.11)
RDW: 19.1 % — AB (ref 11.5–15.5)
WBC: 5.9 10*3/uL (ref 4.0–10.5)

## 2016-01-10 LAB — FERRITIN: FERRITIN: 19 ng/mL (ref 11–307)

## 2016-01-10 MED ORDER — HEPARIN SOD (PORK) LOCK FLUSH 100 UNIT/ML IV SOLN
INTRAVENOUS | Status: AC
  Filled 2016-01-10: qty 5

## 2016-01-10 MED ORDER — SODIUM CHLORIDE 0.9% FLUSH
10.0000 mL | Freq: Once | INTRAVENOUS | Status: AC
  Administered 2016-01-10: 10 mL via INTRAVENOUS

## 2016-01-10 MED ORDER — HEPARIN SOD (PORK) LOCK FLUSH 100 UNIT/ML IV SOLN
500.0000 [IU] | Freq: Once | INTRAVENOUS | Status: AC
  Administered 2016-01-10: 500 [IU] via INTRAVENOUS

## 2016-01-10 NOTE — Patient Instructions (Signed)
[place] at [facility] Discharge Instructions  RECOMMENDATIONS MADE BY THE CONSULTANT AND ANY TEST RESULTS WILL BE SENT TO YOUR REFERRING PHYSICIAN.  Port flush today as scheduled. We will call you tomorrow with lab results. Follow up with primary care regarding blood pressure and medications.  Thank you for choosing [place] at [facility] to provide your oncology and hematology care.  To afford each patient quality time with our provider, please arrive at least 15 minutes before your scheduled appointment time.   Beginning January 23rd 2017 lab work for the [facility] will be done in the  Main lab at [facility] on 1st floor. If you have a lab appointment with the [facility] please come in thru the  Main Entrance and check in at the main information desk  You need to re-schedule your appointment should you arrive 10 or more minutes late.  We strive to give you quality time with our providers, and arriving late affects you and other patients whose appointments are after yours.  Also, if you no show three or more times for appointments you may be dismissed from the clinic at the providers discretion.     Again, thank you for choosing [facility].  Our hope is that these requests will decrease the amount of time that you wait before being seen by our physicians.       _____________________________________________________________  Should you have questions after your visit to [facility], please contact our office at [phone] between the hours of 8:30 a.m. and 4:30 p.m.  Voicemails left after 4:30 p.m. will not be returned until the following business day.  For prescription refill requests, have your pharmacy contact our office.         Resources For Cancer Patients and their Caregivers ? American Cancer Society: Can assist with transportation, wigs, general needs, runs Look Good Feel Better.         9305268747 ? Cancer Care: Provides financial assistance, online support groups, medication/co-pay assistance.  1-800-813-HOPE 402-848-8883) ? [place] Assists [place] cancer patients and their families through emotional , educational and financial support.  872-082-3680 ? [place] DSS Where to apply for food stamps, Medicaid and utility assistance. 910-830-3950 ? [employer]: Transportation to medical appointments. (709)813-4984 ? Social Security Administration: May apply for disability if have a Stage IV cancer. [phone])[phone] ? [employer], Disability and Transit Services: Assists with nutrition, care and transit needs. [phone]

## 2016-01-10 NOTE — Progress Notes (Signed)
[name] presented for Portacath access and flush. Proper placement of portacath confirmed by CXR. Portacath located right chest wall accessed with  H 20 needle. Good blood return present. Portacath flushed with 23m NS and 500U/564mHeparin and needle removed intact. Procedure without incident. Patient tolerated procedure well.  Patient reports her blood pressure has been low for a while and Dr.Fagan is aware and she reports he tells her she is ok. Instruct patient to follow up with Dr.Fagan regarding blood pressure and to continue medications as ordered unless Dr.Fagan tells her otherwise. Informed patient we can not instruct her to stop or change any of her medications that are prescribed by another physician. Patient states she will call Dr.Fagan and discuss meds,blood pressure and follow up with him.

## 2016-01-20 ENCOUNTER — Encounter (HOSPITAL_BASED_OUTPATIENT_CLINIC_OR_DEPARTMENT_OTHER): Payer: Medicare Other

## 2016-01-20 VITALS — BP 135/49 | HR 58 | Temp 98.2°F | Resp 18

## 2016-01-20 DIAGNOSIS — E611 Iron deficiency: Secondary | ICD-10-CM

## 2016-01-20 DIAGNOSIS — D649 Anemia, unspecified: Secondary | ICD-10-CM | POA: Diagnosis present

## 2016-01-20 MED ORDER — HEPARIN SOD (PORK) LOCK FLUSH 100 UNIT/ML IV SOLN
500.0000 [IU] | Freq: Once | INTRAVENOUS | Status: AC
  Administered 2016-01-20: 500 [IU] via INTRAVENOUS

## 2016-01-20 MED ORDER — SODIUM CHLORIDE 0.9 % IV SOLN
INTRAVENOUS | Status: DC
  Administered 2016-01-20: 14:00:00 via INTRAVENOUS

## 2016-01-20 MED ORDER — SODIUM CHLORIDE 0.9 % IV SOLN
750.0000 mg | Freq: Once | INTRAVENOUS | Status: AC
  Administered 2016-01-20: 750 mg via INTRAVENOUS
  Filled 2016-01-20: qty 15

## 2016-01-20 NOTE — Progress Notes (Signed)
Patient tolerated infusion well.  VSS.   

## 2016-01-20 NOTE — Patient Instructions (Signed)
[place] at [facility] Discharge Instructions  RECOMMENDATIONS MADE BY THE CONSULTANT AND ANY TEST RESULTS WILL BE SENT TO YOUR REFERRING PHYSICIAN.  IV Iron today.    Thank you for choosing [place] at [facility] to provide your oncology and hematology care.  To afford each patient quality time with our provider, please arrive at least 15 minutes before your scheduled appointment time.   Beginning January 23rd 2017 lab work for the [facility] will be done in the  Main lab at [facility] on 1st floor. If you have a lab appointment with the [facility] please come in thru the  Main Entrance and check in at the main information desk  You need to re-schedule your appointment should you arrive 10 or more minutes late.  We strive to give you quality time with our providers, and arriving late affects you and other patients whose appointments are after yours.  Also, if you no show three or more times for appointments you may be dismissed from the clinic at the providers discretion.     Again, thank you for choosing [facility].  Our hope is that these requests will decrease the amount of time that you wait before being seen by our physicians.       _____________________________________________________________  Should you have questions after your visit to [facility], please contact our office at [phone] between the hours of 8:30 a.m. and 4:30 p.m.  Voicemails left after 4:30 p.m. will not be returned until the following business day.  For prescription refill requests, have your pharmacy contact our office.         Resources For Cancer Patients and their Caregivers ? American Cancer Society: Can assist with transportation, wigs, general needs, runs Look Good Feel Better.        737-074-8239 ? Cancer Care: Provides financial assistance, online support groups, medication/co-pay assistance.  1-800-813-HOPE  (910) 683-4009) ? [place] Assists [place] cancer patients and their families through emotional , educational and financial support.  [phone] ? [place] DSS Where to apply for food stamps, Medicaid and utility assistance. 208-389-7286 ? [employer]: Transportation to medical appointments. 4386479696 ? Social Security Administration: May apply for disability if have a Stage IV cancer. [phone] ? [employer], Disability and Transit Services: Assists with nutrition, care and transit needs. [phone]

## 2016-01-25 ENCOUNTER — Encounter (HOSPITAL_COMMUNITY): Payer: Medicare Other

## 2016-03-01 DEATH — deceased

## 2016-03-16 ENCOUNTER — Other Ambulatory Visit: Payer: Self-pay | Admitting: Nurse Practitioner

## 2016-03-21 ENCOUNTER — Ambulatory Visit (HOSPITAL_COMMUNITY): Payer: Medicare Other | Admitting: Oncology

## 2016-03-21 ENCOUNTER — Encounter (HOSPITAL_COMMUNITY): Payer: Medicare Other

## 2016-12-21 IMAGING — CT CT CHEST W/ CM
2 of 3 series · 15 of 36 positions shown, 18 images · IV contrast (omnipaque)
Comparison: CT chest dated 10/14/2013.

CLINICAL DATA: History of stage IV head neck cancer with pulmonary
metastases, for restaging. Lung cancer, status post surgery and
chemotherapy. Thyroid mass, status post left thyroidectomy.

EXAM:
CT CHEST WITH CONTRAST
TECHNIQUE: Multidetector CT imaging of the chest was performed during
intravenous contrast administration.
CONTRAST:  75mL OMNIPAQUE IOHEXOL 300 MG/ML  SOLN

[Series 2: chestroutine 5.0 b40f · axial · 0.65mm/px · z∈[-309,-49]mm · 12 of 62 slices shown, 15 images]
[im 5/62  mediastinal]
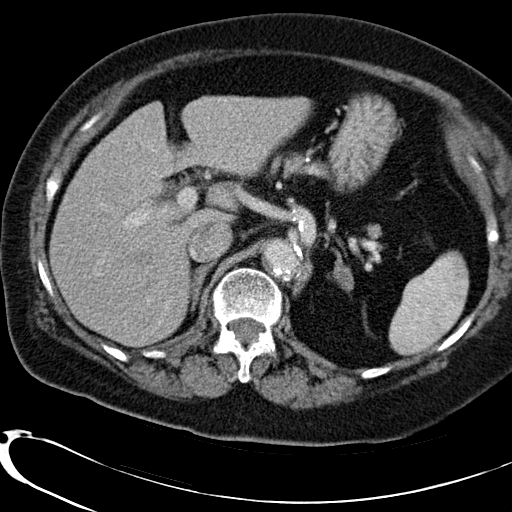
[im 5/62  lung]
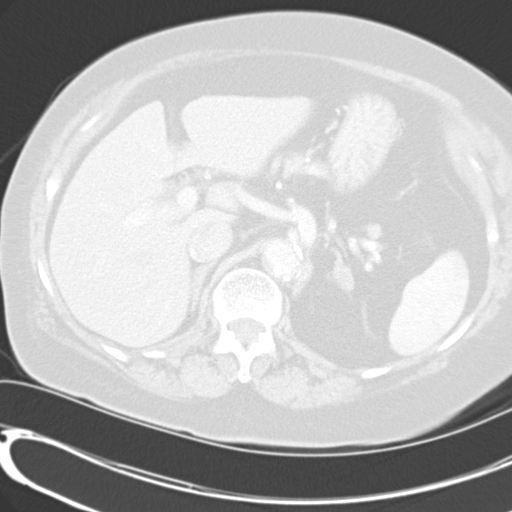
[im 10/62  lung]
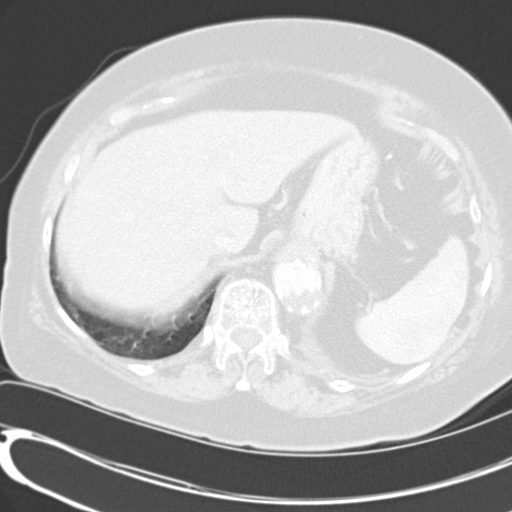
[im 14/62  lung]
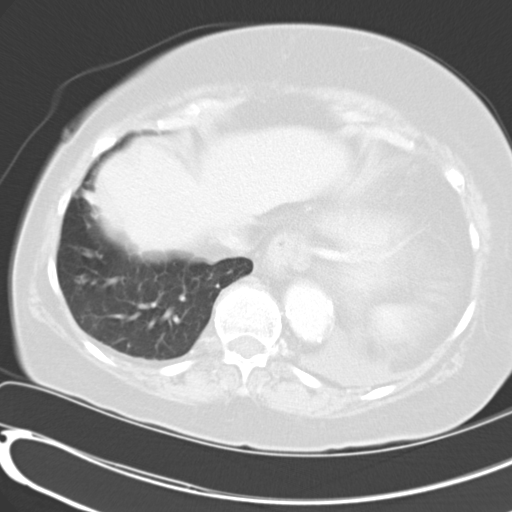
[im 19/62  lung]
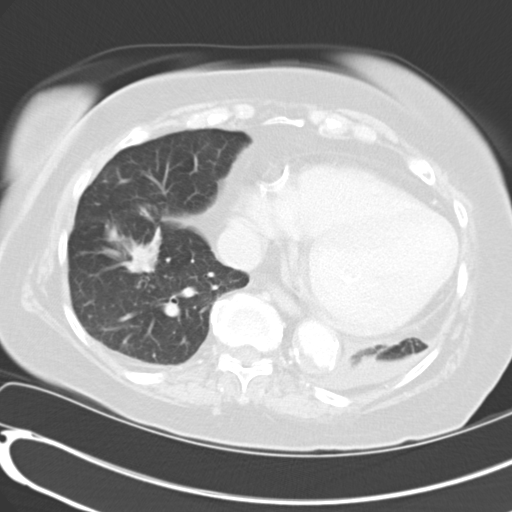
[im 23/62  mediastinal]
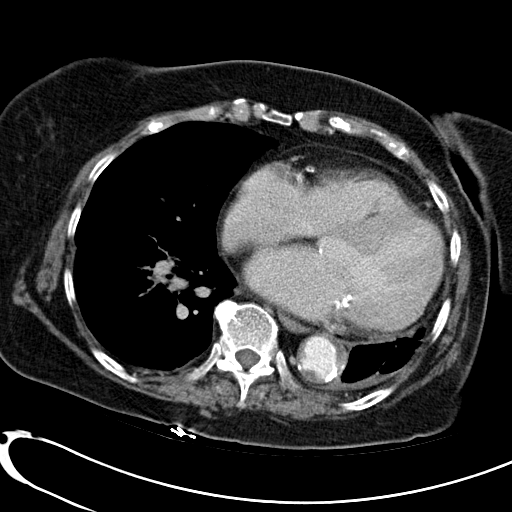
[im 23/62  lung]
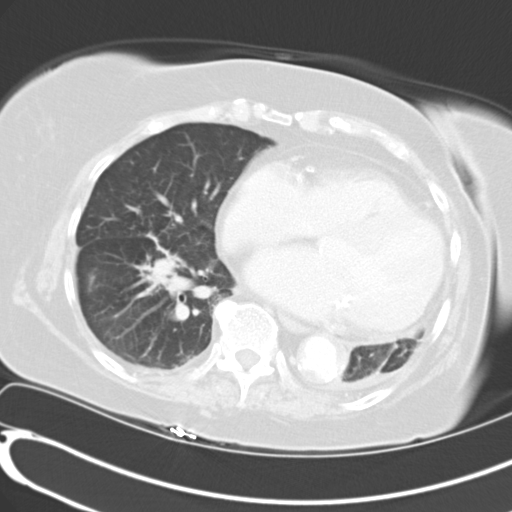
[im 28/62  lung]
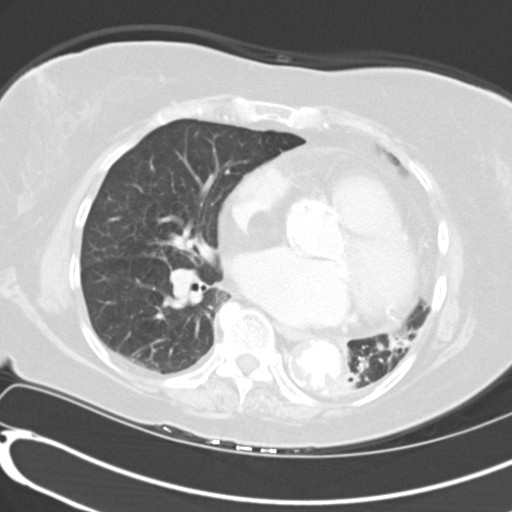
[im 34/62  lung]
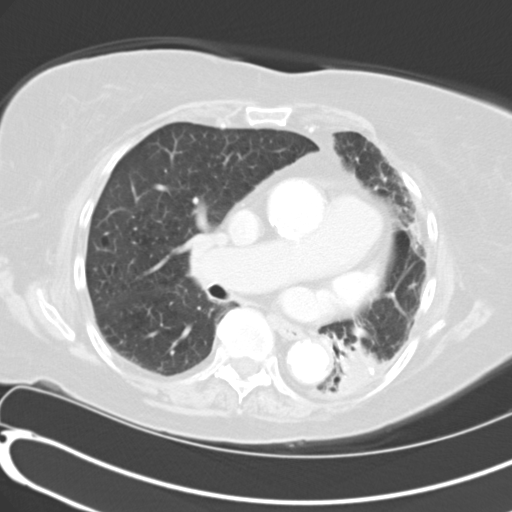
[im 39/62  lung]
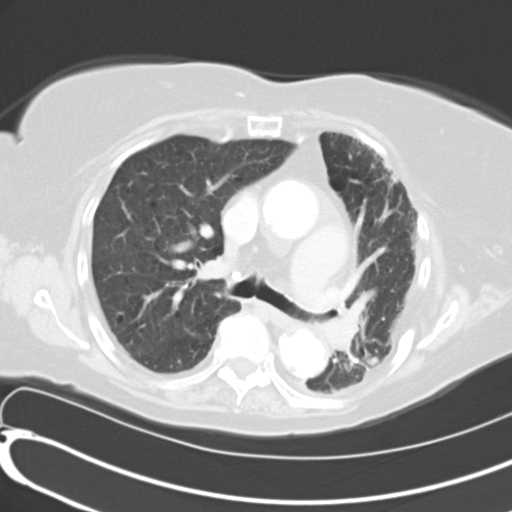
[im 43/62  mediastinal]
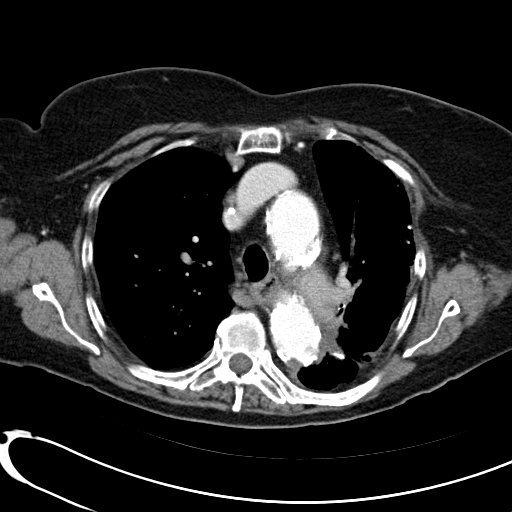
[im 43/62  lung]
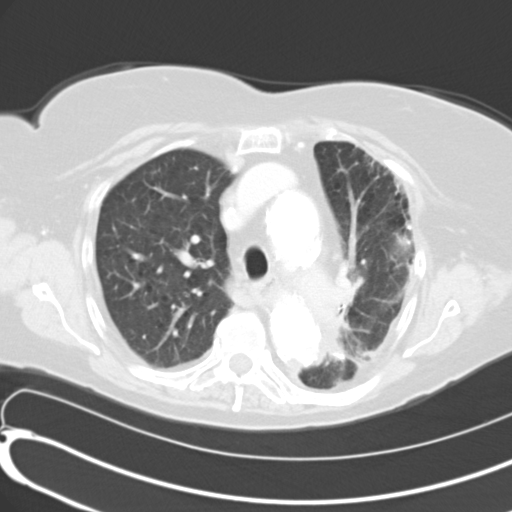
[im 48/62  lung]
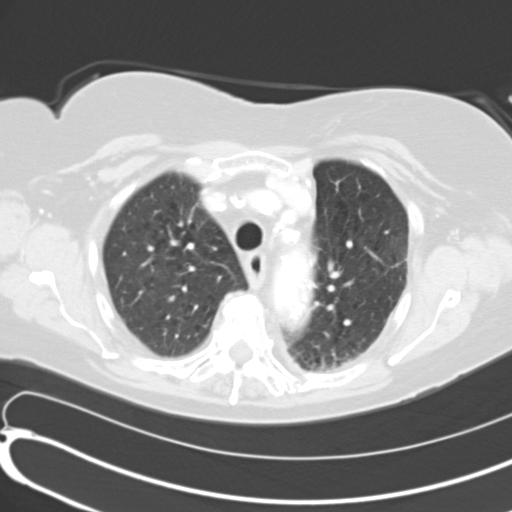
[im 52/62  lung]
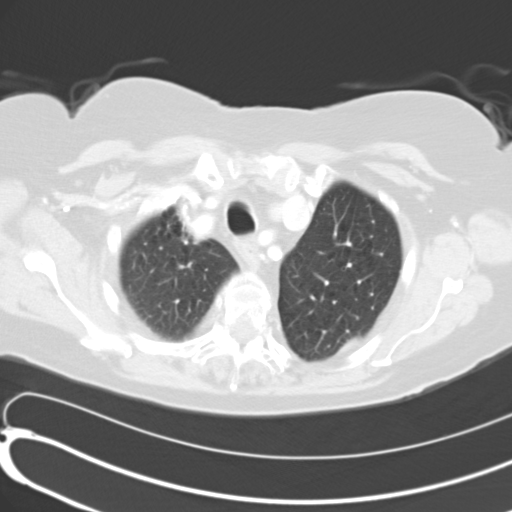
[im 57/62  lung]
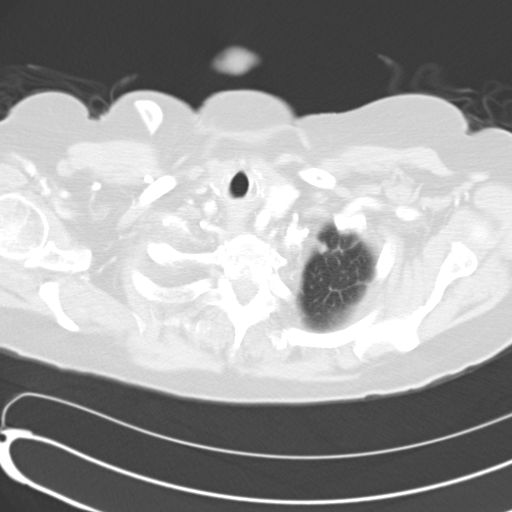

[Series 4: mpr coronal chest 3mm · coronal · 0.62mm/px · 3 of 83 slices shown]
[im 17/83  lung]
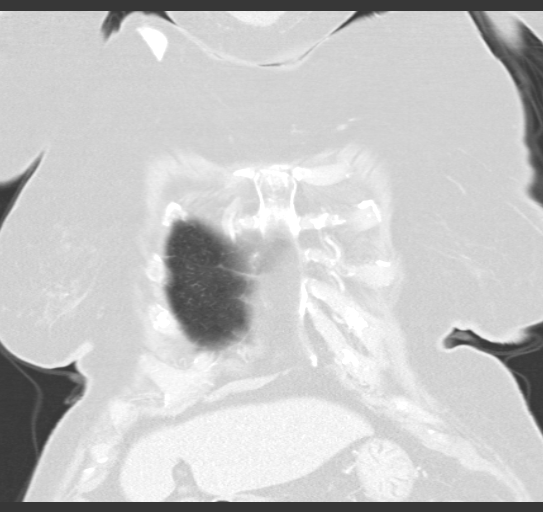
[im 33/83  lung]
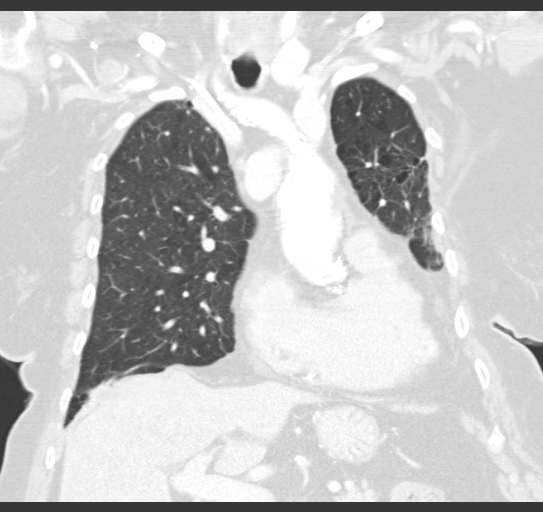
[im 50/83  lung]
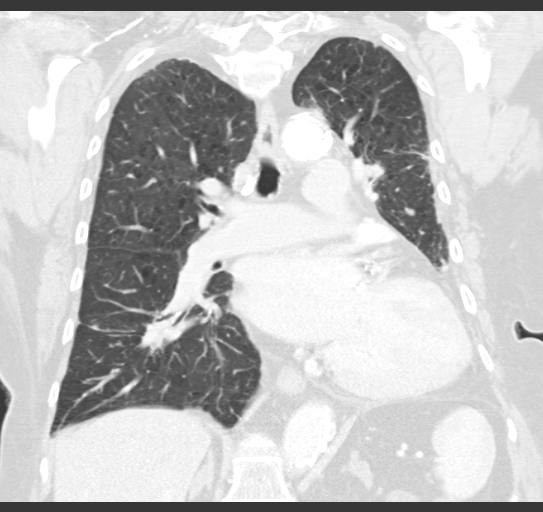

[15 of 36 positions shown; findings below may reference images not displayed]

FINDINGS: Mediastinum/Nodes: The heart is normal in size. No pericardial
effusion.

Coronary atherosclerosis.

Atherosclerotic calcifications of the aortic arch. eccentric mural
thrombus along the descending thoracic aorta.

No suspicious mediastinal, hilar, or axillary lymphadenopathy.

Lungs/Pleura: Scarring/volume loss in the left lower lobe.
Subpleural reticulation/ fibrosis in the anterior left upper lobe.
These findings are favored to be related to radiation changes.

No suspicious pulmonary nodules.

Mild centrilobular emphysematous changes.

Small left pleural effusion.

No pneumothorax.

Upper abdomen: Visualized upper abdomen is notable for
cholecystectomy clips and vascular calcifications.

Musculoskeletal: Mild compression fracture deformity at T3. Moderate
compression fracture deformities at T4-5.

Degenerative changes of the visualized thoracolumbar spine.
IMPRESSION: Radiation changes in the left hemithorax. Small left pleural
effusion.

No evidence of recurrent or metastatic disease in the chest.
